# Patient Record
Sex: Female | Born: 1990 | Race: Black or African American | Hispanic: No | Marital: Single | Smoking: Current every day smoker
Health system: Southern US, Community
[De-identification: ages and names within clinical notes are randomized; demographics above are authoritative.]

## PROBLEM LIST (undated history)

## (undated) ENCOUNTER — Inpatient Hospital Stay: Payer: Self-pay

## (undated) DIAGNOSIS — R519 Headache, unspecified: Secondary | ICD-10-CM

## (undated) DIAGNOSIS — R51 Headache: Secondary | ICD-10-CM

## (undated) DIAGNOSIS — D649 Anemia, unspecified: Secondary | ICD-10-CM

## (undated) DIAGNOSIS — F32A Depression, unspecified: Secondary | ICD-10-CM

## (undated) DIAGNOSIS — A6000 Herpesviral infection of urogenital system, unspecified: Secondary | ICD-10-CM

## (undated) DIAGNOSIS — F329 Major depressive disorder, single episode, unspecified: Secondary | ICD-10-CM

## (undated) HISTORY — PX: BREAST SURGERY: SHX581

---

## 2013-06-17 HISTORY — PX: DIAGNOSTIC LAPAROSCOPY: SUR761

## 2013-06-17 HISTORY — PX: INDUCED ABORTION: SHX677

## 2013-06-20 ENCOUNTER — Emergency Department: Payer: Self-pay | Admitting: Emergency Medicine

## 2013-06-20 LAB — BASIC METABOLIC PANEL
ANION GAP: 3 — AB (ref 7–16)
BUN: 8 mg/dL (ref 7–18)
CO2: 27 mmol/L (ref 21–32)
CREATININE: 0.8 mg/dL (ref 0.60–1.30)
Calcium, Total: 9.5 mg/dL (ref 8.5–10.1)
Chloride: 107 mmol/L (ref 98–107)
EGFR (African American): >60
EGFR (Non-African Amer.): >60
Glucose: 98 mg/dL (ref 65–99)
OSMOLALITY: 272 (ref 275–301)
Potassium: 4.2 mmol/L (ref 3.5–5.1)
SODIUM: 137 mmol/L (ref 136–145)

## 2013-06-20 LAB — CBC WITH DIFFERENTIAL/PLATELET
BASOS ABS: 0 10*3/uL (ref 0.0–0.1)
BASOS PCT: 0.7 %
Eosinophil #: 0.5 10*3/uL (ref 0.0–0.7)
Eosinophil %: 9.1 %
HCT: 38.4 % (ref 35.0–47.0)
HGB: 12.6 g/dL (ref 12.0–16.0)
Lymphocyte #: 2 10*3/uL (ref 1.0–3.6)
Lymphocyte %: 34.8 %
MCH: 28.2 pg (ref 26.0–34.0)
MCHC: 32.8 g/dL (ref 32.0–36.0)
MCV: 86 fL (ref 80–100)
Monocyte #: 0.3 x10 3/mm (ref 0.2–0.9)
Monocyte %: 5.8 %
NEUTROS PCT: 49.6 %
Neutrophil #: 2.8 10*3/uL (ref 1.4–6.5)
PLATELETS: 280 10*3/uL (ref 150–440)
RBC: 4.46 10*6/uL (ref 3.80–5.20)
RDW: 13.8 % (ref 11.5–14.5)
WBC: 5.6 10*3/uL (ref 3.6–11.0)

## 2013-06-20 LAB — URINALYSIS, COMPLETE
Bacteria: NONE SEEN
Bilirubin,UR: NEGATIVE
Blood: NEGATIVE
Glucose,UR: NEGATIVE mg/dL (ref 0–75)
Ketone: NEGATIVE
LEUKOCYTE ESTERASE: NEGATIVE
NITRITE: NEGATIVE
PROTEIN: NEGATIVE
Ph: 6 (ref 4.5–8.0)
Specific Gravity: 1.025 (ref 1.003–1.030)
Squamous Epithelial: 2

## 2014-12-14 ENCOUNTER — Emergency Department
Admission: EM | Admit: 2014-12-14 | Discharge: 2014-12-14 | Disposition: A | Discharge status: Home / Self Care | Payer: 59 | Attending: Emergency Medicine | Admitting: Emergency Medicine

## 2014-12-14 ENCOUNTER — Emergency Department: Payer: 59

## 2014-12-14 ENCOUNTER — Encounter: Payer: Self-pay | Admitting: Emergency Medicine

## 2014-12-14 DIAGNOSIS — O211 Hyperemesis gravidarum with metabolic disturbance: Secondary | ICD-10-CM | POA: Insufficient documentation

## 2014-12-14 DIAGNOSIS — Z87891 Personal history of nicotine dependence: Secondary | ICD-10-CM | POA: Insufficient documentation

## 2014-12-14 DIAGNOSIS — R1110 Vomiting, unspecified: Secondary | ICD-10-CM

## 2014-12-14 DIAGNOSIS — Z3A01 Less than 8 weeks gestation of pregnancy: Secondary | ICD-10-CM | POA: Diagnosis not present

## 2014-12-14 DIAGNOSIS — Z3491 Encounter for supervision of normal pregnancy, unspecified, first trimester: Secondary | ICD-10-CM

## 2014-12-14 LAB — COMPREHENSIVE METABOLIC PANEL
ALBUMIN: 4.1 g/dL (ref 3.5–5.0)
ALT: 57 U/L — AB (ref 14–54)
AST: 41 U/L (ref 15–41)
Alkaline Phosphatase: 77 U/L (ref 38–126)
Anion gap: 9 (ref 5–15)
BUN: 10 mg/dL (ref 6–20)
CALCIUM: 9.5 mg/dL (ref 8.9–10.3)
CO2: 25 mmol/L (ref 22–32)
Chloride: 101 mmol/L (ref 101–111)
Creatinine, Ser: 0.69 mg/dL (ref 0.44–1.00)
GFR calc Af Amer: >60 mL/min (ref >60)
GFR calc non Af Amer: >60 mL/min (ref >60)
GLUCOSE: 95 mg/dL (ref 65–99)
Potassium: 3.7 mmol/L (ref 3.5–5.1)
Sodium: 135 mmol/L (ref 135–145)
TOTAL PROTEIN: 8.2 g/dL — AB (ref 6.5–8.1)
Total Bilirubin: 0.6 mg/dL (ref 0.3–1.2)

## 2014-12-14 LAB — URINALYSIS COMPLETE WITH MICROSCOPIC (ARMC ONLY)
Bilirubin Urine: NEGATIVE
Glucose, UA: NEGATIVE mg/dL
Hgb urine dipstick: NEGATIVE
Leukocytes, UA: NEGATIVE
Nitrite: NEGATIVE
Protein, ur: 30 mg/dL — AB
Specific Gravity, Urine: 1.025 (ref 1.005–1.030)
pH: 6 (ref 5.0–8.0)

## 2014-12-14 LAB — CBC WITH DIFFERENTIAL/PLATELET
Basophils Absolute: 0.1 10*3/uL (ref 0–0.1)
Basophils Relative: 1 %
Eosinophils Absolute: 0 10*3/uL (ref 0–0.7)
Eosinophils Relative: 1 %
HCT: 41 % (ref 35.0–47.0)
HEMOGLOBIN: 13.4 g/dL (ref 12.0–16.0)
LYMPHS PCT: 20 %
Lymphs Abs: 1.8 10*3/uL (ref 1.0–3.6)
MCH: 27.6 pg (ref 26.0–34.0)
MCHC: 32.6 g/dL (ref 32.0–36.0)
MCV: 84.6 fL (ref 80.0–100.0)
MONOS PCT: 7 %
Monocytes Absolute: 0.6 10*3/uL (ref 0.2–0.9)
NEUTROS ABS: 6.5 10*3/uL (ref 1.4–6.5)
NEUTROS PCT: 71 %
Platelets: 293 10*3/uL (ref 150–440)
RBC: 4.85 MIL/uL (ref 3.80–5.20)
RDW: 14.8 % — ABNORMAL HIGH (ref 11.5–14.5)
WBC: 9 10*3/uL (ref 3.6–11.0)

## 2014-12-14 LAB — HCG, QUANTITATIVE, PREGNANCY: hCG, Beta Chain, Quant, S: 223911 m[IU]/mL — ABNORMAL HIGH (ref <5)

## 2014-12-14 MED ORDER — ONDANSETRON HCL 4 MG/2ML IJ SOLN
INTRAMUSCULAR | Status: AC
  Administered 2014-12-14: 4 mg via INTRAVENOUS
  Filled 2014-12-14: qty 2

## 2014-12-14 MED ORDER — ONDANSETRON HCL 4 MG/2ML IJ SOLN
4.0000 mg | Freq: Once | INTRAMUSCULAR | Status: AC
  Administered 2014-12-14: 4 mg via INTRAVENOUS

## 2014-12-14 MED ORDER — DEXTROSE 5 % AND 0.9 % NACL IV BOLUS
1000.0000 mL | Freq: Once | INTRAVENOUS | Status: AC
  Administered 2014-12-14: 1000 mL via INTRAVENOUS

## 2014-12-14 MED ORDER — ONDANSETRON HCL 4 MG PO TABS: 4.0000 mg | ORAL_TABLET | Freq: Every day | ORAL | Status: DC | PRN

## 2014-12-14 NOTE — ED Provider Notes (Signed)
[facility] Emergency Department Provider Note  ____________________________________________  Time seen: 1555  I have reviewed the triage vital signs and the nursing notes.   HISTORY  Chief Complaint Nausea and Emesis     HPI Molly Graves is a 24 y.o. female who is approximately [redacted] weeks pregnant who is having nausea and vomiting. She has been seen at [facility]. She's been prescribed Reglan. She took half a tablet this morning and thinks she kept it down. Despite this she has had emesis today and has had little intake the past 2-3 days. She reports her urine is dark. She is having some discomfort in her pelvis (this is more notable on the right on exam).  No past medical history on file. Patient denies past medical history  There are no active problems to display for this patient.   Past Surgical History  Procedure Laterality Date  . Breast surgery      Current Outpatient Rx  Name  Route  Sig  Dispense  Refill  . ondansetron (ZOFRAN) 4 MG tablet   Oral   Take 1 tablet (4 mg total) by mouth daily as needed for nausea or vomiting.   12 tablet   1     Allergies Review of patient's allergies indicates no known allergies.  History reviewed. No pertinent family history.  Social History History  Substance Use Topics  . Smoking status: Former Games developer  . Smokeless tobacco: Not on file  . Alcohol Use: No    Review of Systems  Constitutional: Negative for fever. ENT: Negative for sore throat. Cardiovascular: Negative for chest pain. Respiratory: Negative for shortness of breath. Gastrointestinal: Positive for nausea and vomiting. See history of present illness. Genitourinary: Patient is pregnant. She has some lower abdominal pain. See history of present illness Musculoskeletal: Patient reports she is having some back pain. Skin: Negative for rash. Neurological: Negative for headaches   10-point ROS otherwise  negative.  ____________________________________________   PHYSICAL EXAM:  VITAL SIGNS: ED Triage Vitals  Enc Vitals Group     BP 12/14/14 1300 113/70 mmHg     Pulse Rate 12/14/14 1300 66     Resp 12/14/14 1300 18     Temp 12/14/14 1300 98.6 F (37 C)     Temp Source 12/14/14 1300 Oral     SpO2 12/14/14 1300 100 %     Weight 12/14/14 1300 140 lb (63.504 kg)     Height 12/14/14 1300  (1.626 m)     Head Cir --      Peak Flow --      Pain Score 12/14/14 1301 7     Pain Loc --      Pain Edu? --      Excl. in GC? --     Constitutional: Alert and oriented. Well appearing and in no distress. ENT   Head: Normocephalic and atraumatic.   Nose: No congestion/rhinnorhea.   Mouth/Throat: Mucous membranes are moist. Cardiovascular: Normal rate, regular rhythm, no murmur noted Respiratory:  Normal respiratory effort, no tachypnea.    Breath sounds are clear and equal bilaterally.  Gastrointestinal: Soft.No distention. Patient does have some tenderness in the right abdomen. This is worse in the right lower quadrant and pelvic area. Back: No muscle spasm, no tenderness, no CVA tenderness. Musculoskeletal: No deformity noted. Nontender with normal range of motion in all extremities.  No noted edema. Neurologic:  Normal speech and language. No gross focal neurologic deficits are appreciated.  Skin:  Skin  is warm, dry. No rash noted. Psychiatric: Mood and affect are normal. Speech and behavior are normal.  ____________________________________________    LABS (pertinent positives/negatives)  Urinalysis: Negative for infection but with 2+ ketones noted. HCG Quant: 223,911 (notably high for 7 weeks of gestation.) CBC: Normal Metabolic panel: Normal electrolytes and kidney function. Slight elevation in ALT at 57. ____________________________________________ ____________________________________________    RADIOLOGY  Pelvic ultrasound  IMPRESSION: 1. Single intrauterine  gestation with normal cardiac activity. 2. Estimated gestational age by crown-rump leak equals 7 weeks 4 days. 3. Large multi cystic lesion of the right ovary has benign imaging characteristics. Recommend non emergent Ob GYN consultation and follow-up ultrasound. ____________________________________________   INITIAL IMPRESSION / ASSESSMENT AND PLAN / ED COURSE  Pertinent labs & imaging results that were available during my care of the patient were reviewed by me and considered in my medical decision making (see chart for details).  Pleasant 24 year old female with hyperemesis gravidarum at 7 weeks. Her hCG level is notably elevated for her stage of gestation. We will obtain an ultrasound to evaluate for multiple gestation or molar pregnancy. I'm treating her with D5, normal saline, for rehydration and to help treat the 2+ ketones. The patient denied have discussed different antinausea medicines, including the risk and benefits of different medications, and I agree on using Zofran currently.  ----------------------------------------- 6:38 PM on 12/14/2014 -----------------------------------------  Ultrasound finds a single IUP with normal cardiac activity at approximate 7 weeks 4 days of gestational age. There is a large multicystic lesion on the right ovary that appears benign per radiology. Further evaluation recommended.  The patient has been treated with IV fluids and Zofran. At this time she is comfortable without nausea. She is not having any discomfort. She is nervous to eat at this time. I have encouraged her to eat small meals. I will prescribe Zofran. She can alternate between that and Reglan or use whichever she feels is best.  ____________________________________________   FINAL CLINICAL IMPRESSION(S) / ED DIAGNOSES  Final diagnoses:  Hyperemesis gravidarum with dehydration  Intrauterine normal pregnancy, first trimester      Darien Ramusavid W Elvie Maines, MD 12/14/14 1926

## 2014-12-14 NOTE — ED Notes (Signed)
Patient with no complaints at this time. Respirations even and unlabored. Skin warm/dry. Discharge instructions reviewed with patient at this time. Patient given opportunity to voice concerns/ask questions. IV removed per policy and band-aid applied to site. Patient discharged at this time and left Emergency Department with steady gait.  

## 2014-12-14 NOTE — Discharge Instructions (Signed)
As we spoke about, you have a high pregnancy hormone level. You have a single intrauterine pregnancy noted. You're feeling better after IV fluids and Zofran. He may take Zofran or Reglan for your nausea. Eat small meals frequently. Drink plenty of fluid. [facility] care. Return to the emergency department if you have any urgent concerns.  Morning Sickness Morning sickness is when you feel sick to your stomach (nauseous) during pregnancy. This nauseous feeling may or may not come with vomiting. It often occurs in the morning but can be a problem any time of day. Morning sickness is most common during the first trimester, but it may continue throughout pregnancy. While morning sickness is unpleasant, it is usually harmless unless you develop severe and continual vomiting (hyperemesis gravidarum). This condition requires more intense treatment.  CAUSES  The cause of morning sickness is not completely known but seems to be related to normal hormonal changes that occur in pregnancy. RISK FACTORS You are at greater risk if you:  Experienced nausea or vomiting before your pregnancy.  Had morning sickness during a previous pregnancy.  Are pregnant with more than one baby, such as twins. TREATMENT  Do not use any medicines (prescription, over-the-counter, or herbal) for morning sickness without first talking to your health care provider. Your health care provider may prescribe or recommend:  Vitamin B6 supplements.  Anti-nausea medicines.  The herbal medicine ginger. HOME CARE INSTRUCTIONS   Only take over-the-counter or prescription medicines as directed by your health care provider.  Taking multivitamins before getting pregnant can prevent or decrease the severity of morning sickness in most women.  Eat a piece of dry toast or unsalted crackers before getting out of bed in the morning.  Eat five or six small meals a day.  Eat dry and bland foods (rice, baked  potato). Foods high in carbohydrates are often helpful.  Do not drink liquids with your meals. Drink liquids between meals.  Avoid greasy, fatty, and spicy foods.  Get someone to cook for you if the smell of any food causes nausea and vomiting.  If you feel nauseous after taking prenatal vitamins, take the vitamins at night or with a snack.  Snack on protein foods (nuts, yogurt, cheese) between meals if you are hungry.  Eat unsweetened gelatins for desserts.  Wearing an acupressure wristband (worn for sea sickness) may be helpful.  Acupuncture may be helpful.  Do not smoke.  Get a humidifier to keep the air in your house free of odors.  Get plenty of fresh air. SEEK MEDICAL CARE IF:   Your home remedies are not working, and you need medicine.  You feel dizzy or lightheaded.  You are losing weight. SEEK IMMEDIATE MEDICAL CARE IF:   You have persistent and uncontrolled nausea and vomiting.  You pass out (faint). MAKE SURE YOU:  Understand these instructions.  Will watch your condition.  Will get help right away if you are not doing well or get worse. Document Released: 07/25/2006 Document Revised: 06/08/2013 Document Reviewed: 11/18/2012 [employer] Patient Information 2015 [employer] [place]. This information is not intended to replace advice given to you by your health care provider. Make sure you discuss any questions you have with your health care provider.

## 2014-12-14 NOTE — ED Notes (Signed)
Pt to ED via EMS transport with c/o n,v, lower back pain and constipation x 1 week, pt states she his [redacted] weeks pregnant, pt has been taking reglan that was prescribed by OB MD

## 2014-12-22 LAB — OB RESULTS CONSOLE RUBELLA ANTIBODY, IGM: Rubella: IMMUNE

## 2014-12-22 LAB — OB RESULTS CONSOLE VARICELLA ZOSTER ANTIBODY, IGG: Varicella: IMMUNE

## 2014-12-22 LAB — OB RESULTS CONSOLE GC/CHLAMYDIA
CHLAMYDIA, DNA PROBE: NEGATIVE
Gonorrhea: NEGATIVE

## 2014-12-22 LAB — OB RESULTS CONSOLE HEPATITIS B SURFACE ANTIGEN: HEP B S AG: NEGATIVE

## 2015-06-18 NOTE — L&D Delivery Note (Signed)
Deliver Note   Date of Delivery:   08/04/2015 Primary OB:   [facility] Gestational Age/EDD: [redacted]w[redacted]d by 08/01/2015, by Other Basis  Antepartum complications:  OB History    Gravida Para Term Preterm AB TAB SAB Ectopic Multiple Living   0 1 0 0 0 0 1      Delivered By:   Vena Austria MD  Delivery Type:   FAVD Anesthesia:    Epidural  Intrapartum complications:  GBS:    Positive (01/10 0000) Laceration:    First degree Episiotomy:    none Placenta:    Spontaneous Estimated Blood Loss:  Baby:    Liveborn female   APGAR (1 MIN): 8   APGAR (5 MINS):  9 Weight: pending  Deliver Details   At 20:41 a liveborn female was delivered via forceps assisted vaginal delivery (Presentation: ROA almost straight OA ).  Patient was displaying recurrent late deceleration but with good variability, good effort with pushing, tested pelvis to 7lbs 3oz, and more than adequate on pelvimetry with non-prominent ischeal spines, and wide pubic symphysis. EFW 8lbs.  Supplemental O2 applied, given fetal tracing discussed expediting delivery with mother via application of long [employer]  The blades were applied, patient began contracting after application of left blade, which was reapplied.  Position verified, articulated with ease during next contraction.  Downward traction was applied guiding the fetal head underneath the the pelvic bone, then gently upward traction was applied until delivery of the fetal brow, providing perineal support with the operators left hand.  The blades were disarticulated and removed.  The remainder of the head was delivered with maternal pushing and restituted facing left.  There was a mild less than 30 second shoulder dystocia which was relieved by reclining the bed and McRoberts without the need for shoulder pressure or additional maneuvers.  The remainder of the body delivered with ease. APGAR:8 , ; weight pending.   Placenta status: spontaneous, intact.  Cord: 3 vessel cord   without complications.    Mom to postpartum.  Baby to Couplet care / Skin to Skin.

## 2015-06-27 LAB — OB RESULTS CONSOLE GBS: GBS: POSITIVE

## 2015-07-12 ENCOUNTER — Encounter: Payer: Self-pay | Admitting: *Deleted

## 2015-07-12 ENCOUNTER — Inpatient Hospital Stay
Admission: EM | Admit: 2015-07-12 | Discharge: 2015-07-12 | Disposition: A | Discharge status: Home / Self Care | Payer: 59 | Attending: Obstetrics & Gynecology | Admitting: Obstetrics & Gynecology

## 2015-07-12 DIAGNOSIS — Z3490 Encounter for supervision of normal pregnancy, unspecified, unspecified trimester: Secondary | ICD-10-CM

## 2015-07-12 DIAGNOSIS — O368130 Decreased fetal movements, third trimester, not applicable or unspecified: Secondary | ICD-10-CM | POA: Diagnosis present

## 2015-07-12 HISTORY — DX: Herpesviral infection of urogenital system, unspecified: A60.00

## 2015-07-12 NOTE — OB Triage Note (Signed)
Vaginal spotting started 10:00 this morning, pinkish discharge now (red earlier today). Decreased fetal movement this morning; but recently felt baby move, "a little bit".

## 2015-07-12 NOTE — Discharge Instructions (Signed)
Discharge instructions reviewed with patient. FOB also at the bedside. Pt. Verbalized understanding of all discharge instructions, copies signed, one copy given to patient.  RN discussed procedure to follow if : pt. Has a temp of 100.4 or greater, rupture of membrane - come to hospital right away (pt. Is GBS positive), vaginal bleeding (saturating a peripad - call EMS - possible medical emergency), and decrease fetal movement ....pt. Verbalized understanding. Instructed pt. to call office to cancel today's 5 p.m. Appt. And reschedule for next week. Pt. In agreement.

## 2015-07-12 NOTE — Final Progress Note (Signed)
Physician Final Progress Note  Patient ID: Molly Graves MRN: [id number] DOB/AGE: 1990-09-19 24 y.o.  Admit date: 07/12/2015 Admitting provider: Leola Brazil, MD Discharge date: 07/12/2015   Admission Diagnoses: IUP at 37.1 weeks Decreased fetal movement Spotting   Discharge Diagnoses: Reactive NST Vaginal spotting possibly due to monilial infection Consults: none  Significant Findings/ Diagnostic Studies: 25 year old G3 P1011 with EDC= 08/01/2015 presents at 37.1 weeks with complaints of decreased fetal movement since this AM and spotting.  Has also been having irregular contractions. No recent intercourse. Has been taking Amoxicillin for a tooth infection and was diagnosed with a monilial infection last week. Took Monistat for a few days but stopped because the cream "burned". Has  A history of genital herpes, but has not been started on Valtrex. Exam: FHR 130 with accelerations to 150s to 160s with moderate variability.  Toco: occasional contraction Spec exam: Vulva: Labia majora appears irritated, no herpetic lesions seen Vagina: no blood in vault, discharge white Wet prep: negative for hyphae, clue cells, Trich Cervix: FT/50%/-1   Procedures: Non stress test which was reactive  Discharge Condition:stable  Disposition: 01-Home or Self Care  Diet: Regular diet  Discharge Activity: Activity as tolerated  Discharge prescriptions: Diflucan 150 mgm po q3days x 2 doses MycologII oint ext 2-3 x/day Valtrex 500 mgm daily   Total time spent taking care of this patient: 20 minutes  Signed: Farrel Conners 07/12/2015, 3:23 PM

## 2015-08-03 ENCOUNTER — Encounter: Payer: Self-pay | Admitting: *Deleted

## 2015-08-03 ENCOUNTER — Inpatient Hospital Stay
Admission: EM | Admit: 2015-08-03 | Discharge: 2015-08-06 | DRG: 775 | Disposition: A | Discharge status: Home / Self Care | Payer: 59 | Attending: Obstetrics and Gynecology | Admitting: Obstetrics and Gynecology

## 2015-08-03 DIAGNOSIS — Z87891 Personal history of nicotine dependence: Secondary | ICD-10-CM

## 2015-08-03 DIAGNOSIS — Z3A40 40 weeks gestation of pregnancy: Secondary | ICD-10-CM | POA: Diagnosis not present

## 2015-08-03 DIAGNOSIS — Z79899 Other long term (current) drug therapy: Secondary | ICD-10-CM | POA: Diagnosis not present

## 2015-08-03 DIAGNOSIS — O368190 Decreased fetal movements, unspecified trimester, not applicable or unspecified: Secondary | ICD-10-CM | POA: Diagnosis present

## 2015-08-03 HISTORY — DX: Depression, unspecified: F32.A

## 2015-08-03 HISTORY — DX: Major depressive disorder, single episode, unspecified: F32.9

## 2015-08-03 LAB — TYPE AND SCREEN
ABO/RH(D): O POS
Antibody Screen: NEGATIVE

## 2015-08-03 LAB — CBC
HCT: 31.8 % — ABNORMAL LOW (ref 35.0–47.0)
Hemoglobin: 10 g/dL — ABNORMAL LOW (ref 12.0–16.0)
MCH: 22.4 pg — AB (ref 26.0–34.0)
MCHC: 31.4 g/dL — AB (ref 32.0–36.0)
MCV: 71.4 fL — AB (ref 80.0–100.0)
PLATELETS: 272 10*3/uL (ref 150–440)
RBC: 4.45 MIL/uL (ref 3.80–5.20)
RDW: 20.5 % — AB (ref 11.5–14.5)
WBC: 9.5 10*3/uL (ref 3.6–11.0)

## 2015-08-03 LAB — CHLAMYDIA/NGC RT PCR (ARMC ONLY)
CHLAMYDIA TR: NOT DETECTED
N GONORRHOEAE: NOT DETECTED

## 2015-08-03 LAB — ABO/RH: ABO/RH(D): O POS

## 2015-08-03 MED ORDER — SODIUM CHLORIDE 0.9 % IV SOLN
1.0000 g | INTRAVENOUS | Status: DC
  Administered 2015-08-03 – 2015-08-04 (×4): 1 g via INTRAVENOUS
  Filled 2015-08-03 (×8): qty 1000

## 2015-08-03 MED ORDER — SODIUM CHLORIDE 0.9 % IV SOLN
2.0000 g | Freq: Once | INTRAVENOUS | Status: AC
  Administered 2015-08-03: 2 g via INTRAVENOUS
  Filled 2015-08-03: qty 2000

## 2015-08-03 MED ORDER — MISOPROSTOL 25 MCG QUARTER TABLET: 25.0000 ug | ORAL_TABLET | ORAL | Status: DC

## 2015-08-03 MED ORDER — OXYTOCIN 40 UNITS IN LACTATED RINGERS INFUSION - SIMPLE MED
2.5000 [IU]/h | INTRAVENOUS | Status: DC
  Administered 2015-08-04: 39.96 [IU]/h via INTRAVENOUS
  Filled 2015-08-03: qty 1000

## 2015-08-03 MED ORDER — OXYTOCIN 10 UNIT/ML IJ SOLN
INTRAMUSCULAR | Status: AC
  Filled 2015-08-03: qty 2

## 2015-08-03 MED ORDER — ONDANSETRON HCL 4 MG/2ML IJ SOLN: 4.0000 mg | Freq: Four times a day (QID) | INTRAMUSCULAR | Status: DC | PRN

## 2015-08-03 MED ORDER — FAMOTIDINE 20 MG PO TABS
10.0000 mg | ORAL_TABLET | Freq: Two times a day (BID) | ORAL | Status: DC
  Administered 2015-08-03 – 2015-08-06 (×5): 10 mg via ORAL
  Filled 2015-08-03 (×5): qty 1

## 2015-08-03 MED ORDER — MISOPROSTOL 25 MCG QUARTER TABLET
ORAL_TABLET | ORAL | Status: AC
  Administered 2015-08-03: 25 ug via ORAL
  Filled 2015-08-03: qty 0.25

## 2015-08-03 MED ORDER — MISOPROSTOL 200 MCG PO TABS
ORAL_TABLET | ORAL | Status: AC
  Filled 2015-08-03: qty 4

## 2015-08-03 MED ORDER — TERBUTALINE SULFATE 1 MG/ML IJ SOLN
0.2500 mg | Freq: Once | INTRAMUSCULAR | Status: DC | PRN
  Filled 2015-08-03: qty 1

## 2015-08-03 MED ORDER — AMMONIA AROMATIC IN INHA
RESPIRATORY_TRACT | Status: AC
  Filled 2015-08-03: qty 10

## 2015-08-03 MED ORDER — LIDOCAINE HCL (PF) 1 % IJ SOLN
INTRAMUSCULAR | Status: AC
  Filled 2015-08-03: qty 30

## 2015-08-03 MED ORDER — ACETAMINOPHEN 325 MG PO TABS: 650.0000 mg | ORAL_TABLET | ORAL | Status: DC | PRN

## 2015-08-03 MED ORDER — LACTATED RINGERS IV SOLN
INTRAVENOUS | Status: DC
  Administered 2015-08-03: 11:00:00 via INTRAVENOUS
  Administered 2015-08-03: 125 mL/h via INTRAVENOUS
  Administered 2015-08-04: 01:00:00 via INTRAVENOUS

## 2015-08-03 MED ORDER — OXYTOCIN 40 UNITS IN LACTATED RINGERS INFUSION - SIMPLE MED
1.0000 m[IU]/min | INTRAVENOUS | Status: DC
  Administered 2015-08-03: 2 m[IU]/min via INTRAVENOUS

## 2015-08-03 MED ORDER — MISOPROSTOL 25 MCG QUARTER TABLET
25.0000 ug | ORAL_TABLET | ORAL | Status: DC
  Administered 2015-08-03 (×3): 25 ug via ORAL
  Filled 2015-08-03: qty 1
  Filled 2015-08-03: qty 0.25
  Filled 2015-08-03 (×4): qty 1
  Filled 2015-08-03: qty 0.25

## 2015-08-03 MED ORDER — OXYTOCIN BOLUS FROM INFUSION: 500.0000 mL | INTRAVENOUS | Status: DC

## 2015-08-03 MED ORDER — LACTATED RINGERS IV SOLN
500.0000 mL | INTRAVENOUS | Status: DC | PRN
  Administered 2015-08-03 – 2015-08-04 (×2): 500 mL via INTRAVENOUS

## 2015-08-03 MED ORDER — BUTORPHANOL TARTRATE 1 MG/ML IJ SOLN
2.0000 mg | INTRAMUSCULAR | Status: DC | PRN
  Administered 2015-08-04: 2 mg via INTRAVENOUS
  Filled 2015-08-03 (×2): qty 2

## 2015-08-03 NOTE — Progress Notes (Signed)
Prolonged FHR deceleration to 90s at 12:44 x 3 min Pitocin discontinued.  Reviewed tracing and pt's status with Dr Elesa Massed. Recommends Cytotec administration. Orders changed and POM reviewed with pt.

## 2015-08-03 NOTE — OB Triage Note (Signed)
Pt. Here with c/o of contractions since 08/02/2015 at 0300.  Contractions getting worse. "I think I lost my mucus plug around 2200 last night."  "I also think my water may have broken, slow leak." (Clear fluid)  Pt. Denies vaginal bleeding, and is feeling the baby move.

## 2015-08-03 NOTE — Progress Notes (Signed)
Intrapartum progress note  S:  Doing well no complaints.  Comfortable in bed with family at bedside.  No LOF VB CTX, +FM  O: BP 125/78 mmHg  Pulse 94  Temp(Src) 98.5 F (36.9 C) (Oral)  Resp 18  Ht  (1.626 m)  Wt 95.255 kg (210 lb)  BMI 36.03 kg/m2  SpO2 100%  LMP 10/25/2014 (Exact Date)  FHT: 145 mod + accels  + deep variables at 7pm and 10:20 pm, spontaneously resolved. TOCO: irritable, occasional ctx SVE: 3.5/70/-3. Posterior, soft  A/P: 24yo [id number]@ 40.2 with IOL due to past due date and category 2 strip  1. IUP: category 2 strip with accelerations and moderate variablilty ensures acidemia not present. 2. IOL: s/p pitocin for a short time then cytotec x 2.  Bishop = 6 at this time, continue cytotec. 3. HSV: not documented in H&P but speculum exam performed upon admission and no lesions noted.   4. GBS+ - receiving ampicillin  S/p 3 doses 5. Continue active management.  ----- Ranae Plumber, MD Attending Obstetrician and Gynecologist [facility]

## 2015-08-03 NOTE — Progress Notes (Signed)
Small amount of clear fluid seen at the vagina, but Nitrazine negative.

## 2015-08-03 NOTE — H&P (Signed)
Obstetric History and Physical  Molly Graves is a 25 y.o. G3P1011 with Estimated Date of Delivery: 08/01/15 per LMP and 9 wk [place] who presents at [redacted]w[redacted]d  presenting for contractions q 20-30 minutes since yesterday and leaking of fluid. No vaginal bleeding, with active fetal movement.    Prenatal Course Source of Care: [facility]  with onset of care at 8 weeks Pregnancy complications or risks: -H/o HSV - on Valtrex, no recent outbreaks -Depression -hx of mild PPD and sx at end of pregnancy, given Rx for Zoloft, but did not start yet -Rt ovarian cyst 7-8 cm, in August was 2 cm  She plans to breastfeed She desires Nuvaring after 6 months or weaning, undecided prior to this for postpartum contraception.   Prenatal labs and studies: ABO, Rh: O+  Antibody: Neg Sickle Cell: neg Rubella: Immune Varicella: Immune RPR:  NR HBsAg:  Neg HIV: Neg GC/CT: Neg/Neg GBS: postive 1 hr Glucola: 131   Genetic screening: Declined  TDAP: UTD    Prenatal Transfer Tool   Past Medical History  Diagnosis Date  . Herpes genitalia   . Medical history non-contributory   . Depression     post partum depression    Past Surgical History  Procedure Laterality Date  . Breast surgery      lumpectomy left breast  . Diagnostic laparoscopy  2015    to rule out uterine injury after D&E  . Induced abortion  2015    OB History  Gravida Para Term Preterm AB SAB TAB Ectopic Multiple Living  0 1 0 0 0 0 1    # Outcome Date GA Lbr Len/2nd Weight Sex Delivery Anes PTL Lv  3 Current           2 Term 10/14/09   7 lb 3 oz (3.26 kg) F   N Y  1 AB               Social History   Social History  . Marital Status: Single    Spouse Name: N/A  . Number of Children: N/A  . Years of Education: N/A   Social History Main Topics  . Smoking status: Former Games developer  . Smokeless tobacco: None  . Alcohol Use: No  . Drug Use: No  . Sexual Activity: Yes   Other Topics Concern  . None   Social History Narrative     History reviewed. No pertinent family history.  Prescriptions prior to admission  Medication Sig Dispense Refill Last Dose  . Prenatal Vit-Fe Fumarate-FA (MULTIVITAMIN-PRENATAL) 27-0.8 MG TABS tablet Take 1 tablet by mouth daily at 12 noon.     . ranitidine (ZANTAC) 150 MG capsule Take 150 mg by mouth 2 (two) times daily.   08/03/2015 at Unknown time  . valACYclovir (VALTREX) 1000 MG tablet Take by mouth daily.     . ondansetron (ZOFRAN) 4 MG tablet Take 1 tablet (4 mg total) by mouth daily as needed for nausea or vomiting. (Patient not taking: Reported on 07/12/2015) 12 tablet 1 Not Taking at Unknown time    No Known Allergies  Review of Systems: Negative except for what is mentioned in HPI.  Physical Exam: BP 113/63 mmHg  Pulse 93  Temp(Src) 98.5 F (36.9 C) (Oral)  Resp 18  Ht  (1.626 m)  Wt 210 lb (95.255 kg)  BMI 36.03 kg/m2  LMP 10/25/2014 (Exact Date) GENERAL: Well-developed, well-nourished female in no acute distress.  ABDOMEN: Soft, nontender, nondistended, gravid. EXTREMITIES:  Nontender, no edema Cervical Exam: Dilatation 2.5-3cm   Effacement 50%   Station -3   Presentation: cephalic FHT: Baseline 135-145, mod variability, + accelertions. Decelerations x 2: at 0838 to 70 bpm x 1 minute and at 9:04 to 80 bpm x 1 minute. Both with return to baseline with repositioning only. Toco did not show contraction at time of deceleration Contractions: Every 6-7 mins   Pertinent Labs/Studies:   No results found for this or any previous visit (from the past 24 hour(s)).  Assessment : IUP at [redacted]w[redacted]d, Category 2 tracing (decelerations)  Plan: Cat 2 tracing per FHR decelerations (otherwise category 1 tracing) - Reviewed risk/benefit of IOL with pt, agrees with plan to keep for IOL d/t FHR decelerations. Begin Pitocin induction.   GBS + begin Ampicillin PPx  IV stadol or epidural as desired by patient/pending labs  H/o depression - Pt denies current symptoms, but will  monitor for PPD. Has Rx to start if desired, reviewed weaning on and off medication and side effects. Will follow closely PP.

## 2015-08-04 ENCOUNTER — Inpatient Hospital Stay: Payer: 59 | Admitting: Anesthesiology

## 2015-08-04 ENCOUNTER — Encounter: Payer: Self-pay | Admitting: Anesthesiology

## 2015-08-04 LAB — RPR: RPR Ser Ql: NONREACTIVE

## 2015-08-04 MED ORDER — OXYCODONE-ACETAMINOPHEN 5-325 MG PO TABS
1.0000 | ORAL_TABLET | ORAL | Status: DC | PRN
  Administered 2015-08-05 (×2): 1 via ORAL
  Filled 2015-08-04 (×2): qty 1

## 2015-08-04 MED ORDER — WITCH HAZEL-GLYCERIN EX PADS: 1.0000 "application " | MEDICATED_PAD | CUTANEOUS | Status: DC | PRN

## 2015-08-04 MED ORDER — EPHEDRINE 5 MG/ML INJ
10.0000 mg | INTRAVENOUS | Status: DC | PRN
  Filled 2015-08-04: qty 2

## 2015-08-04 MED ORDER — FENTANYL 2.5 MCG/ML W/ROPIVACAINE 0.2% IN NS 100 ML EPIDURAL INFUSION (ARMC-ANES)
10.0000 mL/h | EPIDURAL | Status: DC
Start: 2015-08-04 — End: 2015-08-04

## 2015-08-04 MED ORDER — FERROUS SULFATE 325 (65 FE) MG PO TABS
325.0000 mg | ORAL_TABLET | Freq: Every day | ORAL | Status: DC
  Administered 2015-08-05 – 2015-08-06 (×2): 325 mg via ORAL
  Filled 2015-08-04 (×2): qty 1

## 2015-08-04 MED ORDER — BENZOCAINE-MENTHOL 20-0.5 % EX AERO: 1.0000 "application " | INHALATION_SPRAY | CUTANEOUS | Status: DC | PRN

## 2015-08-04 MED ORDER — BUPIVACAINE HCL (PF) 0.25 % IJ SOLN
INTRAMUSCULAR | Status: DC | PRN
  Administered 2015-08-04: 5 mL via EPIDURAL

## 2015-08-04 MED ORDER — PHENYLEPHRINE 40 MCG/ML (10ML) SYRINGE FOR IV PUSH (FOR BLOOD PRESSURE SUPPORT)
80.0000 ug | PREFILLED_SYRINGE | INTRAVENOUS | Status: DC | PRN
  Filled 2015-08-04: qty 2

## 2015-08-04 MED ORDER — LANOLIN HYDROUS EX OINT: TOPICAL_OINTMENT | CUTANEOUS | Status: DC | PRN

## 2015-08-04 MED ORDER — PRENATAL MULTIVITAMIN CH
1.0000 | ORAL_TABLET | Freq: Every day | ORAL | Status: DC
  Administered 2015-08-04 – 2015-08-06 (×3): 1 via ORAL
  Filled 2015-08-04 (×3): qty 1

## 2015-08-04 MED ORDER — ONDANSETRON HCL 4 MG PO TABS
4.0000 mg | ORAL_TABLET | ORAL | Status: DC | PRN
Start: 2015-08-04 — End: 2015-08-06

## 2015-08-04 MED ORDER — FENTANYL 2.5 MCG/ML W/ROPIVACAINE 0.2% IN NS 100 ML EPIDURAL INFUSION (ARMC-ANES)
EPIDURAL | Status: AC
  Administered 2015-08-04: 10 mL/h via EPIDURAL
  Filled 2015-08-04: qty 100

## 2015-08-04 MED ORDER — DOCUSATE SODIUM 100 MG PO CAPS
100.0000 mg | ORAL_CAPSULE | Freq: Every day | ORAL | Status: DC
  Administered 2015-08-04 – 2015-08-06 (×3): 100 mg via ORAL
  Filled 2015-08-04 (×3): qty 1

## 2015-08-04 MED ORDER — ONDANSETRON HCL 4 MG/2ML IJ SOLN: 4.0000 mg | INTRAMUSCULAR | Status: DC | PRN

## 2015-08-04 MED ORDER — DIPHENHYDRAMINE HCL 50 MG/ML IJ SOLN: 12.5000 mg | INTRAMUSCULAR | Status: DC | PRN

## 2015-08-04 MED ORDER — DIBUCAINE 1 % RE OINT: 1.0000 "application " | TOPICAL_OINTMENT | RECTAL | Status: DC | PRN

## 2015-08-04 MED ORDER — IBUPROFEN 600 MG PO TABS
600.0000 mg | ORAL_TABLET | Freq: Four times a day (QID) | ORAL | Status: DC
  Administered 2015-08-04 – 2015-08-06 (×8): 600 mg via ORAL
  Filled 2015-08-04 (×8): qty 1

## 2015-08-04 MED ORDER — OXYCODONE-ACETAMINOPHEN 5-325 MG PO TABS: 2.0000 | ORAL_TABLET | ORAL | Status: DC | PRN

## 2015-08-04 MED ORDER — SIMETHICONE 80 MG PO CHEW: 80.0000 mg | CHEWABLE_TABLET | ORAL | Status: DC | PRN

## 2015-08-04 MED ORDER — LACTATED RINGERS IV SOLN: 500.0000 mL | Freq: Once | INTRAVENOUS | Status: DC

## 2015-08-04 NOTE — Anesthesia Procedure Notes (Signed)
Epidural Patient location during procedure: OB  Staffing Anesthesiologist: Berdine Addison Performed by: anesthesiologist   Preanesthetic Checklist Completed: patient identified, site marked, surgical consent, pre-op evaluation, timeout performed, IV checked, risks and benefits discussed and monitors and equipment checked  Epidural Patient position: sitting Prep: Betadine Patient monitoring: heart rate, continuous pulse ox and blood pressure Approach: midline Location: L4-L5 Injection technique: LOR saline  Needle:  Needle type: Tuohy  Needle gauge: 18 G Needle length: 9 cm and 9 Catheter type: closed end flexible Catheter size: 20 Guage Test dose: negative and 1.5% lidocaine with Epi 1:200 K  Assessment Sensory level: T10 Events: blood not aspirated, injection not painful, no injection resistance, negative IV test and no paresthesia  Additional Notes   Patient tolerated the insertion well without complications. 0510 In. 1610 catheter. 9604 test. 0528 Bolus. 0530 Infusion.Reason for block:procedure for pain

## 2015-08-04 NOTE — Anesthesia Preprocedure Evaluation (Signed)
Anesthesia Evaluation  Patient identified by MRN, date of birth, ID band Patient awake    Reviewed: Allergy & Precautions, NPO status , Patient's Chart, lab work & pertinent test results, reviewed documented beta blocker date and time   Airway Mallampati: II  TM Distance: >3 FB     Dental  (+) Chipped   Pulmonary former smoker,          Cardiovascular     Neuro/Psych PSYCHIATRIC DISORDERS Depression    GI/Hepatic   Endo/Other    Renal/GU      Musculoskeletal   Abdominal   Peds  Hematology   Anesthesia Other Findings   Reproductive/Obstetrics                             Anesthesia Physical Anesthesia Plan  ASA: II  Anesthesia Plan: Epidural   Post-op Pain Management:    Induction:   Airway Management Planned:   Additional Equipment:   Intra-op Plan:   Post-operative Plan:   Informed Consent: I have reviewed the patients History and Physical, chart, labs and discussed the procedure including the risks, benefits and alternatives for the proposed anesthesia with the patient or authorized representative who has indicated his/her understanding and acceptance.     Plan Discussed with:   Anesthesia Plan Comments:         Anesthesia Quick Evaluation  

## 2015-08-04 NOTE — Progress Notes (Signed)
Intrapartum progress note  S: patient comfortable after epidural, resting.  Feeling rectal pressure.  No LOF VB, +FM CTX  O: BP 119/65 mmHg  Pulse 99  Temp(Src) 97.9 F (36.6 C) (Oral)  Resp 18  SpO2 100%  FHT: 130 mod +accels +early decels, +occasional variable, lates - none recurrent TOCO: q2-3 min SVE: 10cm, with bulging bag, AROM'd for clear fluid  A/P: 24yo G3P1011 @ 40.3 with IOL due to past due date with category 2 antepartum tracing.  1. IUP: category 2 tracing, but with accels and moderate variability no fetal acidosis is suspected.  Continuous EFM/TOCO 2. IOL: successful, s/p pitocin then cytotec x3 then pitocin and finally AROM.  Laboring down now, will push when ready. 3. Continue active management.  ----- Ranae Plumber, MD Attending Obstetrician and Gynecologist [facility]

## 2015-08-05 LAB — CBC
HCT: 25.3 % — ABNORMAL LOW (ref 35.0–47.0)
Hemoglobin: 7.7 g/dL — ABNORMAL LOW (ref 12.0–16.0)
MCH: 22.4 pg — AB (ref 26.0–34.0)
MCHC: 30.6 g/dL — AB (ref 32.0–36.0)
MCV: 73.1 fL — ABNORMAL LOW (ref 80.0–100.0)
PLATELETS: 220 10*3/uL (ref 150–440)
RBC: 3.46 MIL/uL — ABNORMAL LOW (ref 3.80–5.20)
RDW: 21.2 % — AB (ref 11.5–14.5)
WBC: 11.3 10*3/uL — ABNORMAL HIGH (ref 3.6–11.0)

## 2015-08-05 NOTE — Progress Notes (Signed)
Admit Date: 08/03/2015 Today's Date: 08/05/2015  Post Partum Day 1  Subjective:  no complaints, up ad lib, voiding and tolerating PO  Objective: Temp:  [97.6 F (36.4 C)-99 F (37.2 C)] 97.6 F (36.4 C) (02/18 0833) Pulse Rate:  [81-103] 81 (02/18 0833) Resp:  [18-20] 20 (02/18 0835) BP: (95-131)/(48-67) 112/56 mmHg (02/18 0833) SpO2:  [99 %-100 %] 100 % (02/18 1610)  Physical Exam:  General: alert, cooperative and no distress Lochia: appropriate Uterine Fundus: firm Incision: none DVT Evaluation: No evidence of DVT seen on physical exam.   Recent Labs  08/03/15 1107  HGB 10.0*  HCT 31.8*    Assessment/Plan: Plan for discharge tomorrow, Breastfeeding and Infant doing well  Plans minipill, then nuvaring   LOS: 2 days   Letitia Libra [facility] 08/05/2015, 9:20 AM

## 2015-08-06 MED ORDER — FERRALET 90 90-1 MG PO TABS: 1.0000 | ORAL_TABLET | Freq: Every morning | ORAL | Status: DC

## 2015-08-06 MED ORDER — NORETHINDRONE 0.35 MG PO TABS: 1.0000 | ORAL_TABLET | Freq: Every day | ORAL | Status: DC

## 2015-08-06 NOTE — Discharge Summary (Signed)
Obstetrical Discharge Summary  Date of Admission: 08/03/2015 Date of Discharge: @  Discharge Diagnosis: Term Pregnancy-delivered Primary OB:  [facility]   Gestational Age at Delivery: [redacted]w[redacted]d  Antepartum complications: none Date of Delivery: 08/04/15   Delivered By: [facility] Delivery Type: spontaneous vaginal delivery Intrapartum complications/course: None Anesthesia: none Placenta: spontaneous Laceration: n/a Episiotomy: none Live born female  Birth Weight: 8 lb 9.6 oz (3900 g) APGAR: 8, 9   Post partum course: Since the delivery, patient has tolerate activity, diet, and daily functions without difficulty or complication.  Min lochia.  No breast concerns at this time.  No signs of depression currently.   Postpartum Exam:General appearance: alert, cooperative and no distress GI: soft, non-tender; bowel sounds normal; no masses,  no organomegaly, Fundus Firm Extremities: extremities normal, atraumatic, no cyanosis or edema Breast: normal in size and symmetry  Disposition: home with infant Rh Immune globulin given: no Rubella vaccine given: no Varicella vaccine given: no Tdap vaccine given in AP or PP setting: given during prenatal care Flu vaccine given in AP or PP setting: given during prenatal care Contraception: oral progesterone-only contraceptive  Prenatal Labs: O POS//Rubella Immune//RPR negative//HIV negative/HepB Surface Ag negative//plans to breastfeed  Plan:  Molly Graves was discharged to home in good condition. Follow-up appointment with [facility] provider in 6 weeks  Discharge Medications:   Medication List    STOP taking these medications        ondansetron 4 MG tablet  Commonly known as:  ZOFRAN     ranitidine 150 MG capsule  Commonly known as:  ZANTAC      TAKE these medications        FERRALET 90 90-1 MG Tabs  Take 1 tablet by mouth every morning.     multivitamin-prenatal 27-0.8 MG Tabs tablet  Take 1 tablet by mouth daily at 12 noon.     norethindrone 0.35 MG tablet  Commonly known as:  ORTHO MICRONOR  Take 1 tablet (0.35 mg total) by mouth daily.  Start taking on:  08/20/2015   (Wait 2-3 weeks before starting)     valACYclovir 1000 MG tablet  Commonly known as:  VALTREX  Take by mouth daily.       ALSO---   COLACE for stool softener                     Call if need for ZOLOFT arises based on symptoms of depression    Follow-up arrangements:  6 weeks  Annamarie Major, MD

## 2015-08-06 NOTE — Progress Notes (Signed)
Discharge instructions provided. Pt and sig other verbalize understanding of all instructions and follow-up care.  Prescriptions given.  Pt discharged to home with infant at 1430 on 08/06/15 via wheelchair by nursing student. Reynold Bowen, RN 08/06/2015 2:40 PM

## 2015-08-06 NOTE — Anesthesia Postprocedure Evaluation (Signed)
Anesthesia Post Note  Patient: Molly Graves  Procedure(s) Performed: * No procedures listed *  Patient location during evaluation: Mother Baby Anesthesia Type: Epidural Level of consciousness: awake, awake and alert and oriented Pain management: pain level controlled Vital Signs Assessment: post-procedure vital signs reviewed and stable Respiratory status: spontaneous breathing and nonlabored ventilation Cardiovascular status: blood pressure returned to baseline and stable Postop Assessment: no headache, no backache and epidural receding Anesthetic complications: no    Last Vitals:  Filed Vitals:   08/06/15 0738 08/06/15 1143  BP:  121/63  Pulse: 77 87  Temp:  36.3 C  Resp:  18    Last Pain:  Filed Vitals:   08/06/15 1317  PainSc: 0-No pain                 Basilio Cairo

## 2015-08-06 NOTE — Progress Notes (Signed)
Education provided on need for Influenza vaccine.  Pt declines vaccine at this time.  Pt states that she received TDaP vaccine during pregnancy. Reynold Bowen, RN 08/06/2015 11:48 AM

## 2015-08-06 NOTE — Discharge Instructions (Addendum)
Vaginal Delivery, Care After °Refer to this sheet in the next few weeks. These discharge instructions provide you with information on caring for yourself after delivery. Your health care provider may also give you specific instructions. Your treatment has been planned according to the most current medical practices available, but problems sometimes occur. Call your health care provider if you have any problems or questions after you go home. °HOME CARE INSTRUCTIONS °· Take over-the-counter or prescription medicines only as directed by your health care provider or pharmacist. °· Do not drink alcohol, especially if you are breastfeeding or taking medicine to relieve pain. °· Do not chew or smoke tobacco. °· Do not use illegal drugs. °· Continue to use good perineal care. Good perineal care includes: °¨ Wiping your perineum from front to back. °¨ Keeping your perineum clean. °· Do not use tampons or douche until your health care provider says it is okay. °· Shower, wash your hair, and take tub baths as directed by your health care provider. °· Wear a well-fitting bra that provides breast support. °· Eat healthy foods. °· Drink enough fluids to keep your urine clear or pale yellow. °· Eat high-fiber foods such as whole grain cereals and breads, brown rice, beans, and fresh fruits and vegetables every day. These foods may help prevent or relieve constipation. °· Follow your health care provider's recommendations regarding resumption of activities such as climbing stairs, driving, lifting, exercising, or traveling. °· Talk to your health care provider about resuming sexual activities. Resumption of sexual activities is dependent upon your risk of infection, your rate of healing, and your comfort and desire to resume sexual activity. °· Try to have someone help you with your household activities and your newborn for at least a few days after you leave the hospital. °· Rest as much as possible. Try to rest or take a nap  when your newborn is sleeping. °· Increase your activities gradually. °· Keep all of your scheduled postpartum appointments. It is very important to keep your scheduled follow-up appointments. At these appointments, your health care provider will be checking to make sure that you are healing physically and emotionally. °SEEK MEDICAL CARE IF:  °· You are passing large clots from your vagina. Save any clots to show your health care provider. °· You have a foul smelling discharge from your vagina. °· You have trouble urinating. °· You are urinating frequently. °· You have pain when you urinate. °· You have a change in your bowel movements. °· You have increasing redness, pain, or swelling near your vaginal incision (episiotomy) or vaginal tear. °· You have pus draining from your episiotomy or vaginal tear. °· Your episiotomy or vaginal tear is separating. °· You have painful, hard, or reddened breasts. °· You have a severe headache. °· You have blurred vision or see spots. °· You feel sad or depressed. °· You have thoughts of hurting yourself or your newborn. °· You have questions about your care, the care of your newborn, or medicines. °· You are dizzy or light-headed. °· You have a rash. °· You have nausea or vomiting. °· You were breastfeeding and have not had a menstrual period within 12 weeks after you stopped breastfeeding. °· You are not breastfeeding and have not had a menstrual period by the 12th week after delivery. °· You have a fever. °SEEK IMMEDIATE MEDICAL CARE IF:  °· You have persistent pain. °· You have chest pain. °· You have shortness of breath. °· You faint. °· You   have leg pain.  You have stomach pain.  Your vaginal bleeding saturates two or more sanitary pads in 1 hour.    Call your doctor for increased pain or vaginal bleeding, temperature above 100.4, depression, or concerns.  No strenuous activity or heavy lifting for 6 weeks.  No intercourse, tampons, douching, or enemas for 6 weeks.   No tub baths-showers only.  No driving for 2 weeks or while taking pain medications.  Continue prenatal vitamin and iron.  Increase calories and fluids while breastfeeding.

## 2015-08-07 LAB — SURGICAL PATHOLOGY

## 2015-12-11 ENCOUNTER — Ambulatory Visit
Admission: RE | Admit: 2015-12-11 | Discharge: 2015-12-11 | Disposition: A | Discharge status: Home / Self Care | Payer: 59 | Source: Ambulatory Visit | Attending: Obstetrics & Gynecology | Admitting: Obstetrics & Gynecology

## 2015-12-11 ENCOUNTER — Other Ambulatory Visit: Payer: Self-pay | Admitting: Obstetrics & Gynecology

## 2015-12-11 DIAGNOSIS — T8339XA Other mechanical complication of intrauterine contraceptive device, initial encounter: Secondary | ICD-10-CM

## 2015-12-11 DIAGNOSIS — X58XXXA Exposure to other specified factors, initial encounter: Secondary | ICD-10-CM | POA: Insufficient documentation

## 2016-04-08 ENCOUNTER — Other Ambulatory Visit: Payer: Self-pay | Admitting: Obstetrics & Gynecology

## 2016-04-08 DIAGNOSIS — T8332XS Displacement of intrauterine contraceptive device, sequela: Secondary | ICD-10-CM

## 2016-04-11 ENCOUNTER — Ambulatory Visit
Admission: RE | Admit: 2016-04-11 | Discharge: 2016-04-11 | Disposition: A | Discharge status: Home / Self Care | Payer: 59 | Source: Ambulatory Visit | Attending: Obstetrics & Gynecology | Admitting: Obstetrics & Gynecology

## 2016-04-11 DIAGNOSIS — Z975 Presence of (intrauterine) contraceptive device: Secondary | ICD-10-CM | POA: Diagnosis not present

## 2016-04-11 DIAGNOSIS — T8332XA Displacement of intrauterine contraceptive device, initial encounter: Secondary | ICD-10-CM | POA: Insufficient documentation

## 2016-04-11 DIAGNOSIS — T8332XS Displacement of intrauterine contraceptive device, sequela: Secondary | ICD-10-CM

## 2016-04-18 ENCOUNTER — Encounter
Admission: RE | Admit: 2016-04-18 | Discharge: 2016-04-18 | Disposition: A | Discharge status: Home / Self Care | Payer: 59 | Source: Ambulatory Visit | Attending: Obstetrics & Gynecology | Admitting: Obstetrics & Gynecology

## 2016-04-18 HISTORY — DX: Headache: R51

## 2016-04-18 HISTORY — DX: Headache, unspecified: R51.9

## 2016-04-18 HISTORY — DX: Anemia, unspecified: D64.9

## 2016-04-18 NOTE — Patient Instructions (Signed)
  Your procedure is scheduled on: 04-19-16 Report to Same Day Surgery 2nd floor medical mall @ 12 PM PER PT   Remember: Instructions that are not followed completely may result in serious medical risk, up to and including death, or upon the discretion of your surgeon and anesthesiologist your surgery may need to be rescheduled.    _x___ 1. Do not eat food or drink liquids after midnight. No gum chewing or hard candies.     __x__ 2. No Alcohol for 24 hours before or after surgery.   __x__3. No Smoking for 24 prior to surgery.   ____  4. Bring all medications with you on the day of surgery if instructed.    __x__ 5. Notify your doctor if there is any change in your medical condition     (cold, fever, infections).     Do not wear jewelry, make-up, hairpins, clips or nail polish.  Do not wear lotions, powders, or perfumes. You may wear deodorant.  Do not shave 48 hours prior to surgery. Men may shave face and neck.  Do not bring valuables to the hospital.    [facility] for any belongings or valuables.               Contacts, dentures or bridgework may not be worn into surgery.  Leave your suitcase in the car. After surgery it may be brought to your room.  For patients admitted to the hospital, discharge time is determined by your treatment team.   Patients discharged the day of surgery will not be allowed to drive home.    Please read over the following fact sheets that you were given:   [facility] Preparing [facility] and or MRSA Information   ____ Take these medicines the morning of surgery with A SIP OF WATER:    1. NONE  2.  3.  4.  5.  6.  ____Fleets enema or Magnesium Citrate as directed.   ____ Use CHG Soap or sage wipes as directed on instruction sheet   ____ Use inhalers on the day of surgery and bring to hospital day of surgery  ____ Stop metformin 2 days prior to surgery    ____ Take 1/2 of usual insulin dose the night before surgery and  none on the morning of  surgery.   __X__ Stop aspirin or coumadin, or plavix -STOP ALKA-SELTZER NOW  x__ Stop Anti-inflammatories such as Advil, Aleve, Ibuprofen, Motrin, Naproxen,          Naprosyn, Goodies powders or aspirin products-STOP IBUPROFEN NOW- Ok to take Tylenol.   ____ Stop supplements until after surgery.    ____ Bring C-Pap to the hospital.

## 2016-04-19 ENCOUNTER — Encounter
Admission: RE | Disposition: A | Discharge status: Home / Self Care | Payer: Self-pay | Source: Ambulatory Visit | Attending: Obstetrics & Gynecology

## 2016-04-19 ENCOUNTER — Ambulatory Visit
Admission: RE | Admit: 2016-04-19 | Discharge: 2016-04-19 | Disposition: A | Discharge status: Home / Self Care | Payer: 59 | Source: Ambulatory Visit | Attending: Obstetrics & Gynecology | Admitting: Obstetrics & Gynecology

## 2016-04-19 ENCOUNTER — Ambulatory Visit: Payer: 59 | Admitting: Anesthesiology

## 2016-04-19 ENCOUNTER — Ambulatory Visit: Payer: 59

## 2016-04-19 DIAGNOSIS — Y768 Miscellaneous obstetric and gynecological devices associated with adverse incidents, not elsewhere classified: Secondary | ICD-10-CM | POA: Diagnosis not present

## 2016-04-19 DIAGNOSIS — Z30432 Encounter for removal of intrauterine contraceptive device: Secondary | ICD-10-CM | POA: Diagnosis present

## 2016-04-19 DIAGNOSIS — A6000 Herpesviral infection of urogenital system, unspecified: Secondary | ICD-10-CM | POA: Insufficient documentation

## 2016-04-19 DIAGNOSIS — T8389XA Other specified complication of genitourinary prosthetic devices, implants and grafts, initial encounter: Secondary | ICD-10-CM | POA: Insufficient documentation

## 2016-04-19 DIAGNOSIS — Z419 Encounter for procedure for purposes other than remedying health state, unspecified: Secondary | ICD-10-CM

## 2016-04-19 DIAGNOSIS — G43909 Migraine, unspecified, not intractable, without status migrainosus: Secondary | ICD-10-CM | POA: Diagnosis not present

## 2016-04-19 DIAGNOSIS — T839XXA Unspecified complication of genitourinary prosthetic device, implant and graft, initial encounter: Secondary | ICD-10-CM

## 2016-04-19 HISTORY — PX: IUD REMOVAL: SHX5392

## 2016-04-19 HISTORY — PX: LAPAROSCOPY: SHX197

## 2016-04-19 HISTORY — PX: HYSTEROSCOPY WITH D & C: SHX1775

## 2016-04-19 HISTORY — DX: Unspecified complication of genitourinary prosthetic device, implant and graft, initial encounter: T83.9XXA

## 2016-04-19 LAB — CBC
HEMATOCRIT: 40.8 % (ref 35.0–47.0)
HEMOGLOBIN: 13 g/dL (ref 12.0–16.0)
MCH: 26.7 pg (ref 26.0–34.0)
MCHC: 31.9 g/dL — ABNORMAL LOW (ref 32.0–36.0)
MCV: 83.6 fL (ref 80.0–100.0)
Platelets: 312 10*3/uL (ref 150–440)
RBC: 4.88 MIL/uL (ref 3.80–5.20)
RDW: 14.1 % (ref 11.5–14.5)
WBC: 7.6 10*3/uL (ref 3.6–11.0)

## 2016-04-19 LAB — TYPE AND SCREEN
ABO/RH(D): O POS
Antibody Screen: NEGATIVE

## 2016-04-19 LAB — POCT PREGNANCY, URINE: PREG TEST UR: NEGATIVE

## 2016-04-19 SURGERY — DILATATION AND CURETTAGE /HYSTEROSCOPY
Anesthesia: General | Wound class: Clean Contaminated

## 2016-04-19 MED ORDER — LACTATED RINGERS IV BOLUS (SEPSIS)
500.0000 mL | Freq: Once | INTRAVENOUS | Status: AC
  Administered 2016-04-19: 500 mL via INTRAVENOUS

## 2016-04-19 MED ORDER — LIDOCAINE HCL (CARDIAC) 20 MG/ML IV SOLN
INTRAVENOUS | Status: DC | PRN
  Administered 2016-04-19: 80 mg via INTRAVENOUS

## 2016-04-19 MED ORDER — FERRIC SUBSULFATE 259 MG/GM EX SOLN
CUTANEOUS | Status: AC
  Filled 2016-04-19: qty 8

## 2016-04-19 MED ORDER — GLYCOPYRROLATE 0.2 MG/ML IJ SOLN
INTRAMUSCULAR | Status: DC | PRN
  Administered 2016-04-19: 0.2 mg via INTRAVENOUS

## 2016-04-19 MED ORDER — OXYCODONE HCL 5 MG PO TABS
ORAL_TABLET | ORAL | Status: AC
  Filled 2016-04-19: qty 1

## 2016-04-19 MED ORDER — ACETAMINOPHEN 650 MG RE SUPP: 650.0000 mg | RECTAL | Status: DC | PRN

## 2016-04-19 MED ORDER — OXYCODONE HCL 5 MG/5ML PO SOLN: 5.0000 mg | Freq: Once | ORAL | Status: AC | PRN

## 2016-04-19 MED ORDER — MIDAZOLAM HCL 2 MG/2ML IJ SOLN
INTRAMUSCULAR | Status: DC | PRN
  Administered 2016-04-19: 2 mg via INTRAVENOUS

## 2016-04-19 MED ORDER — OXYCODONE HCL 5 MG PO TABS
5.0000 mg | ORAL_TABLET | Freq: Once | ORAL | Status: AC | PRN
  Administered 2016-04-19: 5 mg via ORAL

## 2016-04-19 MED ORDER — BUPIVACAINE HCL 0.5 % IJ SOLN
INTRAMUSCULAR | Status: DC | PRN
  Administered 2016-04-19: 10 mL

## 2016-04-19 MED ORDER — SUGAMMADEX SODIUM 200 MG/2ML IV SOLN
INTRAVENOUS | Status: DC | PRN
  Administered 2016-04-19: 150 mg via INTRAVENOUS

## 2016-04-19 MED ORDER — DEXAMETHASONE SODIUM PHOSPHATE 10 MG/ML IJ SOLN
INTRAMUSCULAR | Status: DC | PRN
  Administered 2016-04-19: 5 mg via INTRAVENOUS

## 2016-04-19 MED ORDER — ACETAMINOPHEN 325 MG PO TABS: 650.0000 mg | ORAL_TABLET | ORAL | Status: DC | PRN

## 2016-04-19 MED ORDER — PROPOFOL 10 MG/ML IV BOLUS
INTRAVENOUS | Status: DC | PRN
  Administered 2016-04-19: 170 mg via INTRAVENOUS

## 2016-04-19 MED ORDER — ROCURONIUM BROMIDE 100 MG/10ML IV SOLN
INTRAVENOUS | Status: DC | PRN
  Administered 2016-04-19: 35 mg via INTRAVENOUS

## 2016-04-19 MED ORDER — BUPIVACAINE HCL (PF) 0.5 % IJ SOLN
INTRAMUSCULAR | Status: AC
  Filled 2016-04-19: qty 30

## 2016-04-19 MED ORDER — FENTANYL CITRATE (PF) 100 MCG/2ML IJ SOLN
INTRAMUSCULAR | Status: DC | PRN
  Administered 2016-04-19: 100 ug via INTRAVENOUS
  Administered 2016-04-19: 50 ug via INTRAVENOUS

## 2016-04-19 MED ORDER — FENTANYL CITRATE (PF) 100 MCG/2ML IJ SOLN
INTRAMUSCULAR | Status: AC
  Filled 2016-04-19: qty 2

## 2016-04-19 MED ORDER — FAMOTIDINE 20 MG PO TABS
ORAL_TABLET | ORAL | Status: AC
  Administered 2016-04-19: 20 mg via ORAL
  Filled 2016-04-19: qty 1

## 2016-04-19 MED ORDER — FENTANYL CITRATE (PF) 100 MCG/2ML IJ SOLN
25.0000 ug | INTRAMUSCULAR | Status: DC | PRN
  Administered 2016-04-19 (×2): 50 ug via INTRAVENOUS

## 2016-04-19 MED ORDER — LACTATED RINGERS IV SOLN
INTRAVENOUS | Status: DC
  Administered 2016-04-19: 13:00:00 via INTRAVENOUS

## 2016-04-19 MED ORDER — MORPHINE SULFATE (PF) 2 MG/ML IV SOLN: 1.0000 mg | INTRAVENOUS | Status: DC | PRN

## 2016-04-19 MED ORDER — OXYCODONE-ACETAMINOPHEN 5-325 MG PO TABS: 1.0000 | ORAL_TABLET | ORAL | 0 refills | Status: DC | PRN

## 2016-04-19 MED ORDER — PHENYLEPHRINE HCL 10 MG/ML IJ SOLN
INTRAMUSCULAR | Status: DC | PRN
  Administered 2016-04-19 (×5): 100 ug via INTRAVENOUS

## 2016-04-19 MED ORDER — FAMOTIDINE 20 MG PO TABS
20.0000 mg | ORAL_TABLET | Freq: Once | ORAL | Status: AC
  Administered 2016-04-19: 20 mg via ORAL

## 2016-04-19 MED ORDER — KETOROLAC TROMETHAMINE 30 MG/ML IJ SOLN: 30.0000 mg | Freq: Four times a day (QID) | INTRAMUSCULAR | Status: DC

## 2016-04-19 SURGICAL SUPPLY — 48 items
ABLATOR ENDOMETRIAL MYOSURE (ABLATOR) ×2 IMPLANT
BAG COUNTER SPONGE EZ (MISCELLANEOUS) ×2 IMPLANT
BLADE SURG SZ11 CARB STEEL (BLADE) ×2 IMPLANT
CANISTER SUC SOCK COL 7IN (MISCELLANEOUS) ×2 IMPLANT
CANISTER SUCT 1200ML W/VALVE (MISCELLANEOUS) ×2 IMPLANT
CATH ROBINSON RED A/P 16FR (CATHETERS) ×2 IMPLANT
CHLORAPREP W/TINT 26ML (MISCELLANEOUS) ×2 IMPLANT
DEVICE MYOSURE LITE (MISCELLANEOUS) ×2 IMPLANT
DRESSING TELFA 4X3 1S ST N-ADH (GAUZE/BANDAGES/DRESSINGS) ×2 IMPLANT
DRSG TEGADERM 2-3/8X2-3/4 SM (GAUZE/BANDAGES/DRESSINGS) ×6 IMPLANT
DRSG TEGADERM 2X2.25 PEDS (GAUZE/BANDAGES/DRESSINGS) ×2 IMPLANT
ELECT REM PT RETURN 9FT ADLT (ELECTROSURGICAL) ×2
ELECTRODE REM PT RTRN 9FT ADLT (ELECTROSURGICAL) ×1 IMPLANT
GAUZE SPONGE NON-WVN 2X2 STRL (MISCELLANEOUS) ×2 IMPLANT
GLOVE BIO SURGEON STRL SZ7.5 (GLOVE) ×2 IMPLANT
GLOVE BIO SURGEON STRL SZ8 (GLOVE) ×2 IMPLANT
GLOVE INDICATOR 8.0 STRL GRN (GLOVE) ×2 IMPLANT
GOWN STRL REUS W/ TWL LRG LVL3 (GOWN DISPOSABLE) ×1 IMPLANT
GOWN STRL REUS W/ TWL XL LVL3 (GOWN DISPOSABLE) ×1 IMPLANT
GOWN STRL REUS W/TWL LRG LVL3 (GOWN DISPOSABLE) ×1
GOWN STRL REUS W/TWL XL LVL3 (GOWN DISPOSABLE) ×1
IRRIGATION STRYKERFLOW (MISCELLANEOUS) ×1 IMPLANT
IRRIGATOR STRYKERFLOW (MISCELLANEOUS) ×2
IV LACTATED RINGERS 1000ML (IV SOLUTION) ×2 IMPLANT
LABEL OR SOLS (LABEL) ×2 IMPLANT
LIQUID BAND (GAUZE/BANDAGES/DRESSINGS) ×2 IMPLANT
NEEDLE VERESS 14GA 120MM (NEEDLE) ×2 IMPLANT
NS IRRIG 500ML POUR BTL (IV SOLUTION) ×2 IMPLANT
PACK DNC HYST (MISCELLANEOUS) ×2 IMPLANT
PACK GYN LAPAROSCOPIC (MISCELLANEOUS) ×2 IMPLANT
PAD OB MATERNITY 4.3X12.25 (PERSONAL CARE ITEMS) ×2 IMPLANT
PAD PREP 24X41 OB/GYN DISP (PERSONAL CARE ITEMS) ×2 IMPLANT
SCISSORS METZENBAUM CVD 33 (INSTRUMENTS) ×2 IMPLANT
SHEARS HARMONIC ACE PLUS 36CM (ENDOMECHANICALS) ×2 IMPLANT
SLEEVE ENDOPATH XCEL 5M (ENDOMECHANICALS) ×4 IMPLANT
SOL .9 NS 3000ML IRR  AL (IV SOLUTION) ×1
SOL .9 NS 3000ML IRR UROMATIC (IV SOLUTION) ×1 IMPLANT
SPONGE VERSALON 2X2 STRL (MISCELLANEOUS) ×2
STRAP SAFETY BODY (MISCELLANEOUS) ×2 IMPLANT
SUT VIC AB 2-0 UR6 27 (SUTURE) ×2 IMPLANT
SUT VIC AB 4-0 PS2 18 (SUTURE) ×4 IMPLANT
SYRINGE 10CC LL (SYRINGE) ×4 IMPLANT
TOWEL OR 17X26 4PK STRL BLUE (TOWEL DISPOSABLE) ×2 IMPLANT
TROCAR ENDO BLADELESS 11MM (ENDOMECHANICALS) ×2 IMPLANT
TROCAR XCEL NON-BLD 5MMX100MML (ENDOMECHANICALS) ×2 IMPLANT
TUBING CONNECTING 10 (TUBING) ×2 IMPLANT
TUBING HYSTEROSCOPY DOLPHIN (MISCELLANEOUS) ×2 IMPLANT
TUBING INSUFFLATOR HI FLOW (MISCELLANEOUS) ×2 IMPLANT

## 2016-04-19 NOTE — Anesthesia Procedure Notes (Signed)
Procedure Name: Intubation Date/Time: 04/19/2016 1:28 PM Performed by: Stormy FabianURTIS, Dewanna Hurston Pre-anesthesia Checklist: Patient identified, Patient being monitored, Timeout performed, Emergency Drugs available and Suction available Patient Re-evaluated:Patient Re-evaluated prior to inductionOxygen Delivery Method: Circle system utilized Preoxygenation: Pre-oxygenation with 100% oxygen Intubation Type: IV induction Ventilation: Mask ventilation without difficulty Laryngoscope Size: Mac and 3 Grade View: Grade I Tube type: Oral Tube size: 7.0 mm Number of attempts: 1 Airway Equipment and Method: Stylet Placement Confirmation: ETT inserted through vocal cords under direct vision,  positive ETCO2 and breath sounds checked- equal and bilateral Secured at: 22 cm Tube secured with: Tape Dental Injury: Teeth and Oropharynx as per pre-operative assessment

## 2016-04-19 NOTE — Discharge Instructions (Signed)
General Gynecological Post-Operative Instructions You may expect to feel dizzy, weak, and drowsy for as long as 24 hours after receiving the medicine that made you sleep (anesthetic).  Do not drive a car, ride a bicycle, participate in physical activities, or take public transportation until you are done taking narcotic pain medicines or as directed by your doctor.  Do not drink alcohol or take tranquilizers.  Do not take medicine that has not been prescribed by your doctor.  Do not sign important papers or make important decisions while on narcotic pain medicines.  Have a responsible person with you.  CARE OF INCISION  Keep incision clean and dry. REMOVE BANDAGES in 2 DAYS Take showers instead of baths until your doctor gives you permission to take baths.  Avoid heavy lifting (more than 10 pounds/4.5 kilograms), pushing, or pulling.  Avoid activities that may risk injury to your surgical site.  No sexual intercourse or placement of anything in the vagina for 2 weeks or as instructed by your doctor. If you have tubes coming from the wound site, check with your doctor regarding appropriate care of the tubes. Only take prescription or over-the-counter medicines  for pain, discomfort, or fever as directed by your doctor. Do not take aspirin. It can make you bleed. Take medicines (antibiotics) that kill germs if they are prescribed for you.  Call the office or go to the ER if:  You feel sick to your stomach (nauseous) and you start to throw up (vomit).  You have trouble eating or drinking.  You have an oral temperature above 101.  You have constipation that is not helped by adjusting diet or increasing fluid intake. Pain medicines are a common cause of constipation.  You have any other concerns. SEEK IMMEDIATE MEDICAL CARE IF:  You have persistent dizziness.  You have difficulty breathing or a congested sounding (croupy) cough.  You have an oral temperature above 102.5, not controlled by  medicine.  There is increasing pain or tenderness near or in the surgical site.         AMBULATORY SURGERY  DISCHARGE INSTRUCTIONS   1) The drugs that you were given will stay in your system until tomorrow so for the next 24 hours you should not:  A) Drive an automobile B) Make any legal decisions C) Drink any alcoholic beverage   2) You may resume regular meals tomorrow.  Today it is better to start with liquids and gradually work up to solid foods.  You may eat anything you prefer, but it is better to start with liquids, then soup and crackers, and gradually work up to solid foods.   3) Please notify your doctor immediately if you have any unusual bleeding, trouble breathing, redness and pain at the surgery site, drainage, fever, or pain not relieved by medication.    4) Additional Instructions:        Please contact your physician with any problems or Same Day Surgery at [phone], Monday through Friday 6 am to 4 pm, or [place] [facility] Main number at [phone].

## 2016-04-19 NOTE — H&P (Signed)
History and Physical Interval Note:  04/19/2016 12:59 PM  Molly Graves  has presented today for surgery, with the diagnosis of RETAINED IUD WITH POSSIBLE PERFORATION  The various methods of treatment have been discussed with the patient and family. After consideration of risks, benefits and other options for treatment, the patient has consented to  Procedure(s): DILATATION AND CURETTAGE /HYSTEROSCOPY (N/A) LAPAROSCOPY DIAGNOSTIC (N/A) INTRAUTERINE DEVICE (IUD) REMOVAL (N/A) as a surgical intervention .  The patient's history has been reviewed, patient examined, no change in status, stable for surgery.  Pt has the following beta blocker history-  Not taking Beta Blocker.  I have reviewed the patient's chart and labs.  Questions were answered to the patient's satisfaction.    Letitia Libraobert Paul Monifah Freehling

## 2016-04-19 NOTE — Anesthesia Preprocedure Evaluation (Signed)
Anesthesia Evaluation  Patient identified by MRN, date of birth, ID band Patient awake    Reviewed: Allergy & Precautions, H&P , NPO status , Patient's Chart, lab work & pertinent test results  History of Anesthesia Complications Negative for: history of anesthetic complications  Airway Mallampati: I  TM Distance: >3 FB Neck ROM: full    Dental no notable dental hx. (+) Teeth Intact   Pulmonary neg shortness of breath, Current Smoker,    Pulmonary exam normal breath sounds clear to auscultation       Cardiovascular Exercise Tolerance: Good (-) angina(-) Past MI and (-) DOE negative cardio ROS Normal cardiovascular exam Rhythm:regular Rate:Normal     Neuro/Psych  Headaches, PSYCHIATRIC DISORDERS    GI/Hepatic negative GI ROS, Neg liver ROS,   Endo/Other  negative endocrine ROS  Renal/GU      Musculoskeletal   Abdominal   Peds  Hematology negative hematology ROS (+)   Anesthesia Other Findings Past Medical History: No date: Anemia No date: Depression     Comment: post partum depression No date: Headache     Comment: MIGRAINES 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  Past Surgical History: No date: BREAST SURGERY     Comment: lumpectomy left breast 2015: DIAGNOSTIC LAPAROSCOPY     Comment: to rule out uterine injury after D&E 2015: INDUCED ABORTION  BMI    Body Mass Index:  31.00 kg/m      Reproductive/Obstetrics negative OB ROS                             Anesthesia Physical Anesthesia Plan  ASA: III  Anesthesia Plan: General ETT   Post-op Pain Management:    Induction:   Airway Management Planned:   Additional Equipment:   Intra-op Plan:   Post-operative Plan:   Informed Consent: I have reviewed the patients History and Physical, chart, labs and discussed the procedure including the risks, benefits and alternatives for the proposed anesthesia with the patient or authorized  representative who has indicated his/her understanding and acceptance.     Plan Discussed with: Anesthesiologist, CRNA and Surgeon  Anesthesia Plan Comments:         Anesthesia Quick Evaluation

## 2016-04-19 NOTE — Op Note (Signed)
Operative Note   04/19/2016  PRE-OP DIAGNOSIS: Retained Intrauterine Device   POST-OP DIAGNOSIS: Perforated Intrauterine Device  SURGEON: Annamarie MajorPaul Veronique Warga, MD, FACOG   PROCEDURE: Procedure(s): DILATATION AND CURETTAGE /HYSTEROSCOPY LAPAROSCOPY DIAGNOSTIC INTRAUTERINE DEVICE (IUD) REMOVAL  ANESTHESIA: General   ESTIMATED BLOOD LOSS: min   SPECIMENS: IUD   FLUID DEFICIT: min   COMPLICATIONS: none   DISPOSITION: PACU - hemodynamically stable.   CONDITION: stable   FINDINGS: Exam under anesthesia revealed small, mobile small uterus with no masses and bilateral adnexa without masses or fullness. Hysteroscopy revealed normal lining and No IUD seen, otherwise grossly normal appearing uterine cavity with bilateral tubal ostia and normal appearing endocervical canal. Laparoscopy revealed normal tubes, ovaries, uterus. IUD seen in posterior cul de sac freely removable.   PROCEDURE IN DETAIL: After informed consent was obtained, the patient was taken to the operating room where anesthesia was obtained without difficulty. The patient was positioned in the dorsal lithotomy position in [place] stirrups. The patient's bladder was catheterized with an in and out foley catheter. The patient was examined under anesthesia, with the above noted findings. The weightedspeculum was placed inside the patient's vagina, and the the anterior lip of the cervix was seen and grasped with the tenaculum.  The uterine cavity was sounded to 9cm.  The cervix was progressively dilated to a 18 French-Pratt dilator. The 30 degree hysteroscope was introduced, with LR fluid used to distend the intrauterine cavity, with the above noted findings. Hysteroscope removed. A Hulka Tenaculum was placed.  Attention is then turned to the abdomen where a veress needle is placed through an infraumbilical incision after Marcaine is used to anesthesize the skin.  Veress needle placement is confirmed using the hanging drop technique and the  abdomen is then insufflated w CO2 gas.  A 5mm trocar is then placed under direct visualization with the laparoscope and the above mentioned findings seen.  An additional 5mm  trocar is placed in the RLQ lateral to the inferior epigastric blood vessels with no injuries or bleeding noted.  Then IUD is seen in the posterior cul de sac and easily grasped and removed.  Uterus is inspected with no defects or active perforations or bleeding sites noted.  Gas is expelled and trocars removed with skin closure using Dermabond.  Tenaculum was removed with excellent hemostasis noted. She was then taken out of dorsal lithotomy. Minimal discrepancy in fluid was noted. No bleeding.  The patient tolerated the procedure well. Sponge, lap and needle counts were correct x2. The patient was taken to recovery room in excellent condition.

## 2016-04-20 NOTE — Transfer of Care (Signed)
Immediate Anesthesia Transfer of Care Note  Patient: Molly Graves  Procedure(s) Performed: Procedure(s): DILATATION AND CURETTAGE /HYSTEROSCOPY (N/A) LAPAROSCOPY DIAGNOSTIC (N/A) INTRAUTERINE DEVICE (IUD) REMOVAL (N/A)  Patient Location: PACU  Anesthesia Type:General  Level of Consciousness: sedated  Airway & Oxygen Therapy: Patient Spontanous Breathing and Patient connected to face mask oxygen  Post-op Assessment: Report given to RN and Post -op Vital signs reviewed and stable  Post vital signs: Reviewed and stable  Last Vitals:  Vitals:   04/19/16 1712 04/19/16 1744  BP: 115/63 (!) 108/59  Pulse: 66 65  Resp: 16 16  Temp: 36.6 C     Complications: No apparent anesthesia complications

## 2016-04-22 NOTE — Anesthesia Postprocedure Evaluation (Signed)
Anesthesia Post Note  Patient: Molly Graves  Procedure(s) Performed: Procedure(s) (LRB): DILATATION AND CURETTAGE /HYSTEROSCOPY (N/A) LAPAROSCOPY DIAGNOSTIC (N/A) INTRAUTERINE DEVICE (IUD) REMOVAL (N/A)  Patient location during evaluation: PACU Anesthesia Type: General Level of consciousness: awake and alert Pain management: pain level controlled Vital Signs Assessment: post-procedure vital signs reviewed and stable Respiratory status: spontaneous breathing, nonlabored ventilation, respiratory function stable and patient connected to nasal cannula oxygen Cardiovascular status: blood pressure returned to baseline and stable Postop Assessment: no signs of nausea or vomiting Anesthetic complications: no    Last Vitals:  Vitals:   04/19/16 1712 04/19/16 1744  BP: 115/63 (!) 108/59  Pulse: 66 65  Resp: 16 16  Temp: 36.6 C     Last Pain:  Vitals:   04/22/16 0813  TempSrc:   PainSc: 0-No pain                 Cleda MccreedyJoseph K Piscitello

## 2016-04-24 ENCOUNTER — Encounter: Payer: Self-pay | Admitting: Obstetrics & Gynecology

## 2016-12-14 ENCOUNTER — Encounter: Payer: Self-pay | Admitting: Emergency Medicine

## 2016-12-14 ENCOUNTER — Emergency Department
Admission: EM | Admit: 2016-12-14 | Discharge: 2016-12-14 | Disposition: A | Discharge status: Home / Self Care | Payer: 59 | Attending: Emergency Medicine | Admitting: Emergency Medicine

## 2016-12-14 DIAGNOSIS — K047 Periapical abscess without sinus: Secondary | ICD-10-CM | POA: Insufficient documentation

## 2016-12-14 DIAGNOSIS — R600 Localized edema: Secondary | ICD-10-CM | POA: Diagnosis not present

## 2016-12-14 DIAGNOSIS — F17290 Nicotine dependence, other tobacco product, uncomplicated: Secondary | ICD-10-CM | POA: Diagnosis not present

## 2016-12-14 DIAGNOSIS — K0889 Other specified disorders of teeth and supporting structures: Secondary | ICD-10-CM | POA: Diagnosis present

## 2016-12-14 MED ORDER — LIDOCAINE-EPINEPHRINE 2 %-1:100000 IJ SOLN
1.7000 mL | Freq: Once | INTRAMUSCULAR | Status: AC
  Administered 2016-12-14: 1.7 mL
  Filled 2016-12-14: qty 1.7

## 2016-12-14 MED ORDER — CLINDAMYCIN HCL 150 MG PO CAPS
300.0000 mg | ORAL_CAPSULE | Freq: Once | ORAL | Status: AC
  Administered 2016-12-14: 300 mg via ORAL
  Filled 2016-12-14: qty 2

## 2016-12-14 MED ORDER — CLINDAMYCIN HCL 300 MG PO CAPS: 300.0000 mg | ORAL_CAPSULE | Freq: Three times a day (TID) | ORAL | 0 refills | Status: AC

## 2016-12-14 MED ORDER — MAGIC MOUTHWASH W/LIDOCAINE: 5.0000 mL | Freq: Four times a day (QID) | ORAL | 0 refills | Status: DC

## 2016-12-14 NOTE — ED Provider Notes (Signed)
[facility] Emergency Department Provider Note  ____________________________________________  Time seen: Approximately 9:38 PM  I have reviewed the triage vital signs and the nursing notes.   HISTORY  Chief Complaint Dental Problem    HPI Molly Graves is a 26 y.o. female who presents emergency department complaining of left lower dental infection. Patient reports that she had a root canal and was supposed to return for a cat which she did not. Patient reports pains and swelling to the left lower dentition. No fevers or chills. No difficulty breathing. No recent antibiotic use. No other components this time.   Past Medical History:  Diagnosis Date  . Anemia   . Depression    post partum depression  . Headache    MIGRAINES  . Herpes genitalia     Patient Active Problem List   Diagnosis Date Noted  . Complication of intrauterine device (IUD) (HCC) 04/19/2016    Past Surgical History:  Procedure Laterality Date  . BREAST SURGERY     lumpectomy left breast  . DIAGNOSTIC LAPAROSCOPY  2015   to rule out uterine injury after D&E  . HYSTEROSCOPY W/D&C N/A 04/19/2016   Procedure: DILATATION AND CURETTAGE /HYSTEROSCOPY;  Surgeon: Nadara Mustard, MD;  Location: [facility] ORS;  Service: Gynecology;  Laterality: N/A;  . INDUCED ABORTION  2015  . IUD REMOVAL N/A 04/19/2016   Procedure: INTRAUTERINE DEVICE (IUD) REMOVAL;  Surgeon: Nadara Mustard, MD;  Location: [facility] ORS;  Service: Gynecology;  Laterality: N/A;  . LAPAROSCOPY N/A 04/19/2016   Procedure: LAPAROSCOPY DIAGNOSTIC;  Surgeon: Nadara Mustard, MD;  Location: [facility] OR[facility];  Service: Gynecology;  Laterality: N/A;    Prior to Admission medications   Medication Sig Start Date End Date Taking? Authorizing Provider  Chlorphen-Phenyleph-ASA (ALKA-SELTZER PLUS COLD PO) Take 1 capsule by mouth daily as needed (cold symptoms).    [provider]  clindamycin (CLEOCIN) 300 MG capsule Take 1 capsule (300 mg  total) by mouth 3 (three) times daily. 12/14/16 12/24/16  Cuthriell, Delorise Royals, PA-C  ibuprofen (ADVIL,MOTRIN) 200 MG tablet Take 400 mg by mouth every 6 (six) hours as needed for headache or moderate pain.    [provider]  magic mouthwash w/lidocaine SOLN Take 5 mLs by mouth 4 (four) times daily. 12/14/16   Cuthriell, Delorise Royals, PA-C  oxyCODONE-acetaminophen (PERCOCET) 5-325 MG tablet Take 1 tablet by mouth every 4 (four) hours as needed for moderate pain or severe pain. 04/19/16   Nadara Mustard, MD  Simethicone (GAS RELIEF PO) Take 1-2 tablets by mouth daily as needed (gas).    [provider]    Allergies Patient has no known allergies.  History reviewed. No pertinent family history.  Social History Social History  Substance Use Topics  . Smoking status: Current Some Day Smoker    Years: 7.00    Types: Cigars  . Smokeless tobacco: Never Used  . Alcohol use Yes     Comment: OCC     Review of Systems  Constitutional: No fever/chills Eyes: No visual changes. No discharge ENT: Positive for left lower dental infection Cardiovascular: no chest pain. Respiratory: no cough. No SOB. Gastrointestinal: No abdominal pain.  No nausea, no vomiting.  Musculoskeletal: Negative for musculoskeletal pain. Skin: Negative for rash, abrasions, lacerations, ecchymosis. Neurological: Negative for headaches, focal weakness or numbness. 10-point ROS otherwise negative.  ____________________________________________   PHYSICAL EXAM:  VITAL SIGNS: ED Triage Vitals  Enc Vitals Group     BP 12/14/16 2031 119/68  Pulse Rate 12/14/16 2031 71     Resp 12/14/16 2031 18     Temp 12/14/16 2031 99 F (37.2 C)     Temp Source 12/14/16 2031 Oral     SpO2 12/14/16 2031 98 %     Weight 12/14/16 2033 175 lb (79.4 kg)     Height 12/14/16 2033 5\' 4"  (1.626 m)     Head Circumference --      Peak Flow --      Pain Score 12/14/16 2030 5     Pain Loc --      Pain Edu? --       Excl. in GC? --      Constitutional: Alert and oriented. Well appearing and in no acute distress. Eyes: Conjunctivae are normal. PERRL. EOMI. Head: Atraumatic. ENT:      Ears:       Nose: No congestion/rhinnorhea.      Mouth/Throat: Mucous membranes are moist. Erythema and edema surrounding the second molar of the left lower dentition. No palpable abscess. Minor soft tissue swelling is noted in the cheek. No induration or fluctuance. Oropharynx is nonerythematous and nonedematous. Uvula is midline. Neck: No stridor.   Hematological/Lymphatic/Immunilogical: No cervical lymphadenopathy. Cardiovascular: Normal rate, regular rhythm. Normal S1 and S2.  Good peripheral circulation. Respiratory: Normal respiratory effort without tachypnea or retractions. Lungs CTAB. Good air entry to the bases with no decreased or absent breath sounds. Musculoskeletal: Full range of motion to all extremities. No gross deformities appreciated. Neurologic:  Normal speech and language. No gross focal neurologic deficits are appreciated.  Skin:  Skin is warm, dry and intact. No rash noted. Psychiatric: Mood and affect are normal. Speech and behavior are normal. Patient exhibits appropriate insight and judgement.   ____________________________________________   LABS (all labs ordered are listed, but only abnormal results are displayed)  Labs Reviewed - No data to display ____________________________________________  EKG   ____________________________________________  RADIOLOGY   No results found.  ____________________________________________    PROCEDURES  Procedure(s) performed:    Procedures  Digital block is performed using 1.7 mL of lidocaine with epinephrine. This is injected into the inferior alveolar nerve. Injection used 25-gauge 1-1/2 inch needle. Patient tolerated well with no complications.  Medications  lidocaine-EPINEPHrine (XYLOCAINE W/EPI) 2 %-1:100000 (with pres) injection 1.7  mL (not administered)  clindamycin (CLEOCIN) capsule 300 mg (300 mg Oral Given 12/14/16 2156)     ____________________________________________   INITIAL IMPRESSION / ASSESSMENT AND PLAN / ED COURSE  Pertinent labs & imaging results that were available during my care of the patient were reviewed by me and considered in my medical decision making (see chart for details).  Review of the [place] CSRS was performed in accordance of the [facility] prior to dispensing any controlled drugs.     Patient's diagnosis is consistent with dental infection. Dental block is performed for symptom control. No indication for abscess requiring labs or imaging.. Patient will be discharged home with prescriptions for clindamycin and magic mouthwash. Patient is to follow up with primary care or dentist as needed or otherwise directed. Patient is given ED precautions to return to the ED for any worsening or new symptoms.     ____________________________________________  FINAL CLINICAL IMPRESSION(S) / ED DIAGNOSES  Final diagnoses:  Dental infection      NEW MEDICATIONS STARTED DURING THIS VISIT:  New Prescriptions   CLINDAMYCIN (CLEOCIN) 300 MG CAPSULE    Take 1 capsule (300 mg total) by mouth 3 (three) times daily.  MAGIC MOUTHWASH W/LIDOCAINE SOLN    Take 5 mLs by mouth 4 (four) times daily.        This chart was dictated using voice recognition software/Dragon. Despite best efforts to proofread, errors can occur which can change the meaning. Any change was purely unintentional.    Racheal Patches, PA-C 12/14/16 2209    Phineas Semen, MD 12/14/16 [phone]

## 2016-12-14 NOTE — ED Triage Notes (Signed)
Pt presents to ED 46 c/o dental pain; pt states she had an incomplete root canal, was supposed to go back to have a cap put on it, but she never went back; pt has an abscess of the pre-molar on the lower left. Pt states pain at 5/10 at this time. Pt is awake, alert and oriented x4.

## 2016-12-14 NOTE — Discharge Instructions (Signed)
OPTIONS FOR DENTAL FOLLOW UP CARE ° °[place] Department of Health and Human Services - Local Safety Net Dental Clinics °http://www.ncdhhs.gov/dph/oralhealth/services/safetynetclinics.htm °  °[facility] (336-562-3123) ° °[facility] [place] ([phone]) ° °[facility] ([phone] ext [phone]) ° °[facility] ([phone]) ° °[facility] ([phone]) °This clinic caters to the indigent population and is on a lottery system. °Location: °[facility], [address], [place] °Clinic Hours: °Wednesdays from 6pm - 9pm, patients seen by a lottery system. °For dates, call or go to [email or web address]/[facility]/[email or web address] °Services: °Cleanings, fillings and simple extractions. °Payment Options: °DENTAL WORK IS FREE OF CHARGE. Bring proof of income or support. °Best way to get seen: °Arrive at 5:15 pm - this is a lottery, NOT first come/first serve, so arriving earlier will not increase your chances of being seen. °  °  °[facility] °[phone] °Select option 1 for emergencies °  °Location: °[facility], [address], [place] °Clinic Hours: °No walk-ins accepted - call the day before to schedule an appointment. °Check in times are 9:30 am and 1:30 pm. °Services: °Simple extractions, temporary fillings, pulpectomy/pulp debridement, uncomplicated abscess drainage. °Payment Options: °PAYMENT IS DUE AT THE TIME OF SERVICE.  Fee is usually $100-200, additional surgical procedures (e.g. abscess drainage) may be extra. °Cash, checks, Visa/MasterCard accepted.  Can file Medicaid if patient is covered for dental - patient should call case worker to check. °No discount for [facility] patients. °Best way to get seen: °MUST call the day before and get onto the schedule. Can usually be seen the next 1-2 days. No walk-ins accepted. °  °  °[facility] °[phone] °   °Location: °[facility], [address], [place] °Clinic Hours: °M, W, Th, F 8am or 1:30pm, Tues 9a or 1:30 - first come/first served. °Services: °Simple extractions, temporary fillings, uncomplicated abscess drainage.  You do not need to be an [place] resident. °Payment Options: °PAYMENT IS DUE AT THE TIME OF SERVICE. °Dental insurance, otherwise sliding scale - bring proof of income or support. °Depending on income and treatment needed, cost is usually $50-200. °Best way to get seen: °Arrive early as it is first come/first served. °  °  °[facility] °[phone] °  °Location: °[address] °Clinic Hours: °Mon-Thu 8a-5p °Services: °Most basic dental services including extractions and fillings. °Payment Options: °PAYMENT IS DUE AT THE TIME OF SERVICE. °Sliding scale, up to 50% off - bring proof if income or support. °Medicaid with dental option accepted. °Best way to get seen: °Call to schedule an appointment, can usually be seen within 2 weeks OR they will try to see walk-ins - show up at 8a or 2p (you may have to wait). °  °  °[facility] °[phone] °[place] RESIDENTS ONLY °  °Location: °[facility], [address], [place], [place] [address] °Clinic Hours: By appointment only. °Monday - Thursday 8am-5pm, Friday 8am-12pm °Services: Cleanings, fillings, extractions. °Payment Options: °PAYMENT IS DUE AT THE TIME OF SERVICE. °Cash, Visa or MasterCard. Sliding scale - $30 minimum per service. °Best way to get seen: °Come in to office, complete packet and make an appointment - need proof of income °or support monies for each household member and proof of [place] residence. °Usually takes about a month to get in. °  °  °[facility] °[phone] °  °Location: °[address]   [place] °Clinic Hours: Walk-in Urgent Care Dental Services are offered Monday-Friday  mornings only. °The numbers of emergencies accepted daily is limited to the number of °providers available. °Maximum 15 - Mondays, Wednesdays & Thursdays °Maximum 10 - Tuesdays & Fridays °Services: °You do not need to be a [place] resident to be seen for a dental emergency. °Emergencies are defined as pain, swelling, abnormal bleeding, or dental trauma. Walkins will receive x-rays if needed. °NOTE: Dental cleaning is not an emergency. °Payment Options: °PAYMENT IS DUE AT THE TIME OF SERVICE. °Minimum co-pay is $40.00 for uninsured patients. °Minimum co-pay is $3.00 for Medicaid with dental coverage. °Dental Insurance is accepted and must be presented at time of visit. °Medicare does not cover dental. °Forms of payment: Cash, credit card, checks. °Best way to get seen: °If not previously registered with the clinic, walk-in dental registration begins at 7:15 am and is on a first come/first serve basis. °If previously registered with the clinic, call to make an appointment. °  °  °[facility] °[phone] °[place] RESIDENTS ONLY °  °Location: °[address], [place], [place] °Clinic Hours: °Mon-Thu 10a-2p °Services: Extractions only! °Payment Options: °FREE (donations accepted) - bring proof of income or support °Best way to get seen: °Call and schedule an appointment OR come at 8am on the 1st Monday of every month (except for holidays) when it is first come/first served. °  °  °[facility] °[phone] °  °Location: °[address], [place] °Clinic Hours: °Friday mornings °Services, Payment Options, Best way to get seen: °Call for info °

## 2017-02-18 ENCOUNTER — Ambulatory Visit (INDEPENDENT_AMBULATORY_CARE_PROVIDER_SITE_OTHER): Payer: 59 | Admitting: Obstetrics and Gynecology

## 2017-02-18 ENCOUNTER — Encounter: Payer: Self-pay | Admitting: Obstetrics and Gynecology

## 2017-02-18 VITALS — BP 112/72 | Ht 63.0 in | Wt 180.0 lb

## 2017-02-18 DIAGNOSIS — N898 Other specified noninflammatory disorders of vagina: Secondary | ICD-10-CM

## 2017-02-18 DIAGNOSIS — Z113 Encounter for screening for infections with a predominantly sexual mode of transmission: Secondary | ICD-10-CM

## 2017-02-18 LAB — POCT WET PREP WITH KOH
Clue Cells Wet Prep HPF POC: NEGATIVE
KOH Prep POC: NEGATIVE
Trichomonas, UA: NEGATIVE
YEAST WET PREP PER HPF POC: NEGATIVE

## 2017-02-18 MED ORDER — FLUCONAZOLE 150 MG PO TABS: 150.0000 mg | ORAL_TABLET | Freq: Once | ORAL | 0 refills | Status: AC

## 2017-02-18 NOTE — Progress Notes (Signed)
Chief Complaint  Patient presents with  . Vaginal Discharge    Would like to be checked for std including blood work    HPI:      [name] is a 26 y.o. [id number] who LMP was Patient's last menstrual period was 02/01/2017., presents today for STD check, just to be safe. No known exposures/specific sx. She has had an increased d/c, with mild odor and itch for the past 2-3 wks. She treated with AZO yeast with temporary relief. She is sex active, no new partners. Using pull out method/no condoms. No fevers. Has had mild LBP, belly pain. No recent abx use.    Past Medical History:  Diagnosis Date  . Anemia   . Depression    post partum depression  . Headache    MIGRAINES  . Herpes genitalia     Past Surgical History:  Procedure Laterality Date  . BREAST SURGERY     lumpectomy left breast  . DIAGNOSTIC LAPAROSCOPY  2015   to rule out uterine injury after D&E  . HYSTEROSCOPY W/D&C N/A 04/19/2016   Procedure: DILATATION AND CURETTAGE /HYSTEROSCOPY;  Surgeon: Nadara Mustard, MD;  Location: [facility];  Service: Gynecology;  Laterality: N/A;  . INDUCED ABORTION  2015  . IUD REMOVAL N/A 04/19/2016   Procedure: INTRAUTERINE DEVICE (IUD) REMOVAL;  Surgeon: Nadara Mustard, MD;  Location: [facility];  Service: Gynecology;  Laterality: N/A;  . LAPAROSCOPY N/A 04/19/2016   Procedure: LAPAROSCOPY DIAGNOSTIC;  Surgeon: Nadara Mustard, MD;  Location: [facility];  Service: Gynecology;  Laterality: N/A;    History reviewed. No pertinent family history.  Social History   Social History  . Marital status: Single    Spouse name: N/A  . Number of children: N/A  . Years of education: N/A   Occupational History  . Not on file.   Social History Main Topics  . Smoking status: Current Some Day Smoker    Years: 7.00    Types: Cigars  . Smokeless tobacco: Never Used  . Alcohol use Yes     Comment: OCC  . Drug use: No  . Sexual activity: Yes   Other Topics Concern  . Not on file    Social History Narrative  . No narrative on file     Current Outpatient Prescriptions:  .  fluconazole (DIFLUCAN) 150 MG tablet, Take 1 tablet (150 mg total) by mouth once., Disp: 1 tablet, Rfl: 0   ROS:  Review of Systems  Constitutional: Negative for fever.  Gastrointestinal: Negative for blood in stool, constipation, diarrhea, nausea and vomiting.  Genitourinary: Positive for vaginal discharge. Negative for dyspareunia, dysuria, flank pain, frequency, hematuria, urgency, vaginal bleeding and vaginal pain.  Musculoskeletal: Positive for back pain.  Skin: Negative for rash.     OBJECTIVE:   Vitals:  BP 112/72   Ht 5\' 3"  (1.6 m)   Wt 180 lb (81.6 kg)   LMP 02/01/2017   BMI 31.89 kg/m   Physical Exam  Constitutional: She is oriented to person, place, and time and well-developed, well-nourished, and in no distress. Vital signs are normal.  Genitourinary: Uterus normal, cervix normal, right adnexa normal, left adnexa normal and vulva normal. Uterus is not enlarged. Cervix exhibits no motion tenderness and no tenderness. Right adnexum displays no mass and no tenderness. Left adnexum displays no mass and no tenderness. Vulva exhibits no erythema, no exudate, no lesion, no rash and no tenderness. Vagina exhibits no lesion. Watery  thin  odorless  white and vaginal discharge found.  Neurological: She is oriented to person, place, and time.  Vitals reviewed.   Results: Results for orders placed or performed in visit on 02/18/17 (from the past 24 hour(s))  POCT Wet Prep with KOH     Status: Normal   Collection Time: 02/18/17  3:00 PM  Result Value Ref Range   Trichomonas, UA Negative    Clue Cells Wet Prep HPF POC neg    Epithelial Wet Prep HPF POC  Few, Moderate, Many, Too numerous to count   Yeast Wet Prep HPF POC neg    Bacteria Wet Prep HPF POC  Few   RBC Wet Prep HPF POC     WBC Wet Prep HPF POC     KOH Prep POC Negative Negative     Assessment/Plan: Screening  for STD (sexually transmitted disease) - Will call pt with results. - Plan: Chlamydia/Gonococcus/Trichomonas, NAA, HIV antibody, HSV 2 antibody, IgG, RPR, Hepatitis C antibody  Vaginal discharge - Neg wet prep/pos exam. Treat empirically with Rx diflucan. Check gon/chlam. F/u prn.  - Plan: Chlamydia/Gonococcus/Trichomonas, NAA, POCT Wet Prep with KOH  Vaginal irritation - Plan: fluconazole (DIFLUCAN) 150 MG tablet, POCT Wet Prep with KOH    Meds ordered this encounter  Medications  . fluconazole (DIFLUCAN) 150 MG tablet    Sig: Take 1 tablet (150 mg total) by mouth once.    Dispense:  1 tablet    Refill:  0      Return if symptoms worsen or fail to improve.  Lupe Bonner B. Davinia Riccardi, PA-C 02/18/2017 3:01 PM

## 2017-02-19 ENCOUNTER — Encounter: Payer: Self-pay | Admitting: Obstetrics and Gynecology

## 2017-02-19 LAB — HEPATITIS C ANTIBODY: Hep C Virus Ab: 0.1 s/co ratio (ref 0.0–0.9)

## 2017-02-19 LAB — RPR: RPR Ser Ql: NONREACTIVE

## 2017-02-19 LAB — HIV ANTIBODY (ROUTINE TESTING W REFLEX): HIV Screen 4th Generation wRfx: NONREACTIVE

## 2017-02-19 LAB — HSV 2 ANTIBODY, IGG: HSV 2 IgG, Type Spec: <0.91 index (ref 0.00–0.90)

## 2017-02-20 LAB — CHLAMYDIA/GONOCOCCUS/TRICHOMONAS, NAA
Chlamydia by NAA: NEGATIVE
GONOCOCCUS BY NAA: NEGATIVE
Trich vag by NAA: NEGATIVE

## 2017-05-01 ENCOUNTER — Ambulatory Visit: Payer: 59 | Admitting: Obstetrics and Gynecology

## 2017-05-01 ENCOUNTER — Encounter: Payer: Self-pay | Admitting: Obstetrics and Gynecology

## 2017-05-01 VITALS — BP 116/58 | HR 75 | Ht 64.0 in | Wt 175.0 lb

## 2017-05-01 DIAGNOSIS — N926 Irregular menstruation, unspecified: Secondary | ICD-10-CM

## 2017-05-01 DIAGNOSIS — Z3201 Encounter for pregnancy test, result positive: Secondary | ICD-10-CM | POA: Diagnosis not present

## 2017-05-01 LAB — POCT URINE PREGNANCY: Preg Test, Ur: POSITIVE — AB

## 2017-05-01 NOTE — Progress Notes (Signed)
Chief Complaint  Patient presents with  . Amenorrhea    HPI:      Molly Graves is a 26 y.o. [id number] who LMP was Patient's last menstrual period was 03/24/2017., presents today for late menses. She is sex active, using pull out method. No spotting, nausea, breast tenderness. She is interested in conception after a 20# wt loss. She is currently doing HCG injections, B12 inj, and phentermine. Not taking PNVs. 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  Vag sx from 9/18 resolved with diflucan tx.   Past Medical History:  Diagnosis Date  . Anemia   . Depression    post partum depression  . Headache    MIGRAINES  . Herpes genitalia     Past Surgical History:  Procedure Laterality Date  . BREAST SURGERY     lumpectomy left breast  . DIAGNOSTIC LAPAROSCOPY  2015   to rule out uterine injury after D&E  . HYSTEROSCOPY W/D&C N/A 04/19/2016   Procedure: DILATATION AND CURETTAGE /HYSTEROSCOPY;  Surgeon: Gae Dry, MD;  Location: [facility];  Service: Gynecology;  Laterality: N/A;  . INDUCED ABORTION  2015  . IUD REMOVAL N/A 04/19/2016   Procedure: INTRAUTERINE DEVICE (IUD) REMOVAL;  Surgeon: Gae Dry, MD;  Location: [facility];  Service: Gynecology;  Laterality: N/A;  . LAPAROSCOPY N/A 04/19/2016   Procedure: LAPAROSCOPY DIAGNOSTIC;  Surgeon: Gae Dry, MD;  Location: [facility];  Service: Gynecology;  Laterality: N/A;    History reviewed. No pertinent family history.  Social History   Socioeconomic History  . Marital status: Single    Spouse name: Not on file  . Number of children: Not on file  . Years of education: Not on file  . Highest education level: Not on file  Social Needs  . Financial resource strain: Not on file  . Food insecurity - worry: Not on file  . Food insecurity - inability: Not on file  . Transportation needs - medical: Not on file  . Transportation needs - non-medical: Not on file  Occupational  History  . Not on file  Tobacco Use  . Smoking status: Current Some Day Smoker    Years: 7.00    Types: Cigars  . Smokeless tobacco: Never Used  Substance and Sexual Activity  . Alcohol use: Yes    Comment: OCC  . Drug use: No  . Sexual activity: Yes  Other Topics Concern  . Not on file  Social History Narrative  . Not on file     Current Outpatient Medications:  .  Cyanocobalamin (B-12 COMPLIANCE INJECTION) 1000 MCG/ML KIT, Inject as directed., Disp: , Rfl:  .  phentermine 37.5 MG capsule, Take 37.5 mg every morning by mouth., Disp: , Rfl:    ROS:  Review of Systems  Constitutional: Negative for fever.  Gastrointestinal: Negative for blood in stool, constipation, diarrhea, nausea and vomiting.  Genitourinary: Positive for menstrual problem. Negative for dyspareunia, dysuria, flank pain, frequency, hematuria, urgency, vaginal bleeding, vaginal discharge and vaginal pain.  Musculoskeletal: Negative for back pain.  Skin: Negative for rash.     OBJECTIVE:   Vitals:  BP (!) 116/58 (BP Location: Left Arm, Patient Position: Sitting, Cuff Size: Normal)   Pulse 75   Ht 5' 4" (1.626 m)   Wt 175 lb (79.4 kg)   LMP 03/24/2017   BMI 30.04 kg/m   Physical Exam  Constitutional: She is oriented to person, place, and time  and well-developed, well-nourished, and in no distress.  Neurological: She is alert and oriented to person, place, and time.  Psychiatric: Memory, affect and judgment normal.  Vitals reviewed.   Results: Results for orders placed or performed in visit on 05/01/17 (from the past 24 hour(s))  POCT urine pregnancy     Status: Abnormal   Collection Time: 05/01/17  2:35 PM  Result Value Ref Range   Preg Test, Ur Positive (A) Negative     Assessment/Plan: Late menses - Pos UPT. D/C phentermine, wt loss injections. Start PNVs. RTO for NOB. EDD=12/28/17; [redacted]w[redacted]d - Plan: POCT urine pregnancy  Positive pregnancy test   Return in about 1 week (around 05/08/2017)  for NOB appt.  Molly B. Copland, PA-C 05/01/2017 2:47 PM

## 2017-05-01 NOTE — Patient Instructions (Signed)
I value your feedback and entrusting us with your care. If you get a [facility] patient survey, I would appreciate you taking the time to let us know about your experience today. Thank you! 

## 2017-05-09 ENCOUNTER — Emergency Department: Payer: 59

## 2017-05-09 ENCOUNTER — Emergency Department
Admission: EM | Admit: 2017-05-09 | Discharge: 2017-05-09 | Disposition: A | Discharge status: Home / Self Care | Payer: 59 | Attending: Emergency Medicine | Admitting: Emergency Medicine

## 2017-05-09 ENCOUNTER — Other Ambulatory Visit: Payer: Self-pay

## 2017-05-09 ENCOUNTER — Encounter: Payer: Self-pay | Admitting: *Deleted

## 2017-05-09 DIAGNOSIS — O200 Threatened abortion: Secondary | ICD-10-CM | POA: Diagnosis not present

## 2017-05-09 DIAGNOSIS — Z3A01 Less than 8 weeks gestation of pregnancy: Secondary | ICD-10-CM | POA: Insufficient documentation

## 2017-05-09 DIAGNOSIS — F17290 Nicotine dependence, other tobacco product, uncomplicated: Secondary | ICD-10-CM | POA: Diagnosis not present

## 2017-05-09 DIAGNOSIS — O209 Hemorrhage in early pregnancy, unspecified: Secondary | ICD-10-CM | POA: Diagnosis present

## 2017-05-09 LAB — CBC
HCT: 42.1 % (ref 35.0–47.0)
Hemoglobin: 13.9 g/dL (ref 12.0–16.0)
MCH: 27.7 pg (ref 26.0–34.0)
MCHC: 33 g/dL (ref 32.0–36.0)
MCV: 84 fL (ref 80.0–100.0)
PLATELETS: 301 10*3/uL (ref 150–440)
RBC: 5.01 MIL/uL (ref 3.80–5.20)
RDW: 14.5 % (ref 11.5–14.5)
WBC: 7.4 10*3/uL (ref 3.6–11.0)

## 2017-05-09 LAB — PREGNANCY, URINE: PREG TEST UR: POSITIVE — AB

## 2017-05-09 LAB — HCG, QUANTITATIVE, PREGNANCY: HCG, BETA CHAIN, QUANT, S: 1449 m[IU]/mL — AB (ref <5)

## 2017-05-09 LAB — ABO/RH: ABO/RH(D): O POS

## 2017-05-09 NOTE — ED Provider Notes (Signed)
[facility] Emergency Department Provider Note   ____________________________________________    I have reviewed the triage vital signs and the nursing notes.   HISTORY  Chief Complaint Vaginal Bleeding     HPI Molly Graves is a 26 y.o. female reports she is approximately [redacted] weeks pregnant who presents with vaginal bleeding.  Patient reports mild to moderate bleeding which started yesterday.  She did develop some bilateral pelvic cramping today.  No dizziness.  Patient is G4 P2.  Sees [facility] but has not seen them yet for this pregnancy.  Has not taken anything for pain   Past Medical History:  Diagnosis Date  . Anemia   . Depression    post partum depression  . Headache    MIGRAINES  . Herpes genitalia     Patient Active Problem List   Diagnosis Date Noted  . Complication of intrauterine device (IUD) ([place]) 04/19/2016    Past Surgical History:  Procedure Laterality Date  . BREAST SURGERY     lumpectomy left breast  . DIAGNOSTIC LAPAROSCOPY  2015   to rule out uterine injury after D&E  . HYSTEROSCOPY W/D&C N/A 04/19/2016   Procedure: DILATATION AND CURETTAGE /HYSTEROSCOPY;  Surgeon: Gae Dry, MD;  Location: [facility] ORS;  Service: Gynecology;  Laterality: N/A;  . INDUCED ABORTION  2015  . IUD REMOVAL N/A 04/19/2016   Procedure: INTRAUTERINE DEVICE (IUD) REMOVAL;  Surgeon: Gae Dry, MD;  Location: [facility] ORS;  Service: Gynecology;  Laterality: N/A;  . LAPAROSCOPY N/A 04/19/2016   Procedure: LAPAROSCOPY DIAGNOSTIC;  Surgeon: Gae Dry, MD;  Location: [facility] ORS;  Service: Gynecology;  Laterality: N/A;    Prior to Admission medications   Medication Sig Start Date End Date Taking? Authorizing Provider  Cyanocobalamin (B-12 COMPLIANCE INJECTION) 1000 MCG/ML KIT Inject as directed.    [provider]     Allergies Patient has no known allergies.  History reviewed. No pertinent family history.  Social  History Social History   Tobacco Use  . Smoking status: Current Some Day Smoker    Years: 7.00    Types: Cigars  . Smokeless tobacco: Never Used  Substance Use Topics  . Alcohol use: Yes    Comment: OCC  . Drug use: No    Review of Systems  Constitutional: No fever/chills Eyes: No visual changes.  ENT: No sore throat. Cardiovascular: Denies chest pain. Respiratory: Denies shortness of breath. Gastrointestinal: No abdominal pain.  No nausea, no vomiting.   Genitourinary: As above Musculoskeletal: Negative for back pain. Skin: Negative for rash. Neurological: Negative for headaches    ____________________________________________   PHYSICAL EXAM:  VITAL SIGNS: ED Triage Vitals  Enc Vitals Group     BP 05/09/17 1649 121/63     Pulse Rate 05/09/17 1649 72     Resp 05/09/17 1649 20     Temp 05/09/17 1649 99.2 F (37.3 C)     Temp Source 05/09/17 1649 Oral     SpO2 05/09/17 1649 100 %     Weight 05/09/17 1649 77.1 kg (170 lb)     Height 05/09/17 1649 1.626 m (5' 4" )     Head Circumference --      Peak Flow --      Pain Score 05/09/17 1648 5     Pain Loc --      Pain Edu? --      Excl. in [place]? --     Constitutional: Alert and oriented. No acute  distress. Pleasant and interactive Eyes: Conjunctivae are normal.  nose: No congestion/rhinnorhea. Mouth/Throat: Mucous membranes are moist.   Neck:  Painless ROM Cardiovascular: Normal rate, regular rhythm. Good peripheral circulation. Respiratory: Normal respiratory effort.  No retractions.  Gastrointestinal: Soft and nontender. No distention.  No CVA tenderness. Genitourinary: deferred Musculoskeletal:  Warm and well perfused Neurologic:  Normal speech and language. No gross focal neurologic deficits are appreciated.  Skin:  Skin is warm, dry and intact. No rash noted. Psychiatric: Mood and affect are normal. Speech and behavior are normal.  ____________________________________________   LABS (all labs ordered  are listed, but only abnormal results are displayed)  Labs Reviewed  HCG, QUANTITATIVE, PREGNANCY - Abnormal; Notable for the following components:      Result Value   hCG, Beta Chain, Quant, S 1,449 (*)    All other components within normal limits  PREGNANCY, URINE - Abnormal; Notable for the following components:   Preg Test, Ur POSITIVE (*)    All other components within normal limits  CBC  ABO/RH   ____________________________________________  EKG  None ____________________________________________  RADIOLOGY  Ultrasound shows gestational sac ____________________________________________   PROCEDURES  Procedure(s) performed: No  Procedures   Critical Care performed: No ____________________________________________   INITIAL IMPRESSION / ASSESSMENT AND PLAN / ED COURSE  Pertinent labs & imaging results that were available during my care of the patient were reviewed by me and considered in my medical decision making (see chart for details).  The patient presents with vaginal bleeding, reported [redacted] weeks pregnant.  Differential diagnosis includes threatened miscarriage, miscarriage, vaginal bleeding in first trimester.  Ultrasound shows gestational sac, beta hCG 1500, patient to follow-up with GYN this week for repeat hormone.    ____________________________________________   FINAL CLINICAL IMPRESSION(S) / ED DIAGNOSES  Final diagnoses:  Threatened miscarriage        Note:  This document was prepared using Dragon voice recognition software and may include unintentional dictation errors.    Lavonia Drafts, MD 05/09/17 2118

## 2017-05-09 NOTE — ED Triage Notes (Signed)
Pt presents w/ c/o vaginal bleeding, lower abdominal pain that is intermittent and cramping. Pt P4G2A1P2. Pt due date 12/27/17. Pt states vaginal bleeding started approximately 2000 yesterday. Pt states pain started today at 1100 today, worsening over time.

## 2017-05-09 NOTE — Discharge Instructions (Signed)
Please follow up with OB/GYN early next week for a repeat hormone level

## 2017-05-09 NOTE — ED Notes (Signed)

## 2017-05-12 ENCOUNTER — Encounter: Payer: Self-pay | Admitting: Obstetrics & Gynecology

## 2017-05-12 ENCOUNTER — Ambulatory Visit (INDEPENDENT_AMBULATORY_CARE_PROVIDER_SITE_OTHER): Payer: 59 | Admitting: Obstetrics & Gynecology

## 2017-05-12 VITALS — BP 110/70 | Wt 172.0 lb

## 2017-05-12 DIAGNOSIS — O200 Threatened abortion: Secondary | ICD-10-CM | POA: Diagnosis not present

## 2017-05-12 NOTE — Progress Notes (Signed)
Obstetric Problem Visit   Chief Complaint:  Chief Complaint  Patient presents with  . Threatened Miscarriage    History of Present Illness: Patient is a 26 y.o. [id number] 6710w0d presenting for first trimester bleeding.  The onset of bleeding was 3 days ago and moderate with some associated crampiness; min to no nausea; no breast T.  Was not on birth control, but does not desire pregnancy if this is a miscarriage moving forward.  Is bleeding equal to or greater than normal menstrual flow:  Yes Any recent trauma:  No Recent intercourse:  No History of prior miscarriage:  No Prior ultrasound demonstrating IUP:  Yes Prior ultrasound demonstrating viable IUP:  No Prior Serum HCG:  Yes- 1449 on 11/23 Rh status: O+  PMHx: She  has a past medical history of Anemia, Depression, Headache, and Herpes genitalia. Also,  has a past surgical history that includes Breast surgery; Diagnostic laparoscopy (2015); Induced abortion (2015); Hysteroscopy w/D&C (N/A, 04/19/2016); laparoscopy (N/A, 04/19/2016); and IUD removal (N/A, 04/19/2016)., family history includes Hypertension in her maternal aunt and maternal grandmother.,  reports that she has been smoking cigars.  She has smoked for the past 7.00 years. she has never used smokeless tobacco. She reports that she drinks alcohol. She reports that she does not use drugs.  She has a current medication list which includes the following prescription(s): cyanocobalamin. Also, has No Known Allergies.  Review of Systems  Constitutional: Negative for chills, fever and malaise/fatigue.  HENT: Negative for congestion, sinus pain and sore throat.   Eyes: Negative for blurred vision and pain.  Respiratory: Negative for cough and wheezing.   Cardiovascular: Negative for chest pain and leg swelling.  Gastrointestinal: Negative for abdominal pain, constipation, diarrhea, heartburn, nausea and vomiting.  Genitourinary: Negative for dysuria, frequency, hematuria and urgency.    Musculoskeletal: Negative for back pain, joint pain, myalgias and neck pain.  Skin: Negative for itching and rash.  Neurological: Negative for dizziness, tremors and weakness.  Endo/Heme/Allergies: Does not bruise/bleed easily.  Psychiatric/Behavioral: Negative for depression. The patient is not nervous/anxious and does not have insomnia.     Objective: Vitals:   05/12/17 0903  BP: 110/70   Physical Exam  Constitutional: She is oriented to person, place, and time. She appears well-developed and well-nourished. No distress.  Abdominal: Normal appearance. She exhibits no mass. There is no tenderness.  Musculoskeletal: Normal range of motion.  Neurological: She is alert and oriented to person, place, and time.  Skin: Skin is warm and dry.  Psychiatric: She has a normal mood and affect.  Vitals reviewed.   Assessment: 26 y.o. [id number] 8310w0d 1. Threatened abortion - Beta HCG, Quant   Plan: Problem List Items Addressed This Visit    None    Visit Diagnoses    Threatened abortion    -  Primary   Relevant Orders   Beta HCG, Quant     1) First trimester bleeding - incidence and clinical course of first trimester bleeding is discussed in detail with the patient today.  Approximately 1/3 of pregnancies ending in live births experienced 1st trimester bleeding.  The amount of bleeding is variable and not necessarily predictive of outcome.  Sources may be cervical or uterine.  Subchorionic hemorrhages are a frequent concurrent findings on ultrasound and are followed expectantly.  These often absorb or regress spontaneously although risk for expansion and further disruption of the utero-placental interface leading to miscarriage is possible.  There is no clearly documented benefit to limiting or modifying  activity and sexual intercourse in altering clinic course of 1st trimester bleeding.    2) If not already done will proceed with TVUS evaluation to document viability, and if uncertain  viability or absence of a demonstrable IUP (and no previous documentation of IUP) will trend HCG levels.  3) The patient is Rh O+, rhogam is therefore not indicated to decrease the risk rhesus alloimmunization.    4) Routine bleeding precautions were discussed with the patient prior the conclusion of today's visit.  5) Plans OCP if miscarriage confirmed.  Counseled when to start, side effects., risks.  Will eRx once know for sure this is miscarriage. Sample given [place].  Annamarie MajorPaul Annaliza Zia, MD, Merlinda FrederickFACOG [facility], [facility] 05/12/2017  9:21 AM

## 2017-05-13 ENCOUNTER — Other Ambulatory Visit: Payer: Self-pay | Admitting: Obstetrics & Gynecology

## 2017-05-13 LAB — BETA HCG QUANT (REF LAB): HCG QUANT: 208 m[IU]/mL

## 2017-05-13 MED ORDER — NORETHIN-ETH ESTRAD-FE BIPHAS 1 MG-10 MCG / 10 MCG PO TABS: 1.0000 | ORAL_TABLET | Freq: Every day | ORAL | 3 refills | Status: DC

## 2017-05-16 ENCOUNTER — Encounter: Payer: 59 | Admitting: Obstetrics & Gynecology

## 2017-05-29 ENCOUNTER — Telehealth: Payer: Self-pay

## 2017-05-29 NOTE — Telephone Encounter (Signed)
[facility]. Thx.

## 2017-05-29 NOTE — Telephone Encounter (Signed)
Pt has questions about her birthcontrol.  She had a miscarriage two weeks ago, started bc 1wk ago, is bleeding very heavily, has strep throat and is on med for that c bc, stomach pains. [phone]

## 2017-06-03 NOTE — Telephone Encounter (Signed)
Check on patient and see if still bleeding. Apologize for delay in response. Expected to bleedin transition after miscarriage and w new pill, so unless severe would keep taking for month and see

## 2017-06-03 NOTE — Telephone Encounter (Signed)
I'm honestly just seeing this. Patient you saw last.  Somehow this took a while to actually show up in my in box.

## 2017-06-03 NOTE — Telephone Encounter (Signed)
Pt aware and ok

## 2017-08-08 ENCOUNTER — Ambulatory Visit (INDEPENDENT_AMBULATORY_CARE_PROVIDER_SITE_OTHER): Payer: 59 | Admitting: Advanced Practice Midwife

## 2017-08-08 ENCOUNTER — Encounter: Payer: Self-pay | Admitting: Advanced Practice Midwife

## 2017-08-08 VITALS — BP 118/74 | Ht 64.0 in | Wt 172.0 lb

## 2017-08-08 DIAGNOSIS — N898 Other specified noninflammatory disorders of vagina: Secondary | ICD-10-CM | POA: Diagnosis not present

## 2017-08-08 DIAGNOSIS — N926 Irregular menstruation, unspecified: Secondary | ICD-10-CM | POA: Diagnosis not present

## 2017-08-08 LAB — POCT URINE PREGNANCY: PREG TEST UR: NEGATIVE

## 2017-08-08 NOTE — Progress Notes (Signed)
S: The patient is here today with complaint of vaginal discharge and some irritation. She had some itching that occurred a few weeks ago which is better now. She occasionally has an odor but has no complaint of that today. She tried 3 day monistat 2 weeks ago. She denies symptoms of UTI. She denies risk for STD. She is wondering if she has a yeast infection. She has also missed a period and is wondering if she is pregnant.   O: Vital Signs: BP 118/74   Ht 5\' 4"  (1.626 m)   Wt 172 lb (78 kg)   LMP 03/24/2017   BMI 29.52 kg/m  Constitutional: Well nourished, well developed female in no acute distress.  HEENT: normal Skin: Warm and dry.   Respiratory:  Normal respiratory effort Psych: Alert and Oriented x3. No memory deficits. Normal mood and affect.  MS: normal gait, normal bilateral lower extremity ROM/strength/stability.  Pelvic exam:  is not limited by body habitus EGBUS: within normal limits Vagina: within normal limits and with normal mucosa, blood in the vault, no discharge seen, swab done of vaginal mucosa Cervix: appearance normal  Wet Prep: negative for clue cells, yeast, whiff POCT pregnancy test negative  A: 27 yo female with vaginal irritation  P: Recommend against medication treatment at this time given lack of evidence for vaginitis Recommend OTC boric acid suppository for correcting vaginal pH, sea salt soak in tub for comfort, avoid tight fitting clothes, good hygiene, no douching, probiotics for beneficial bacteria Return to clinic PRN for worsening symptoms  Molly Graves, CNM

## 2017-08-11 ENCOUNTER — Ambulatory Visit: Payer: 59 | Admitting: Obstetrics & Gynecology

## 2018-03-23 ENCOUNTER — Encounter (HOSPITAL_COMMUNITY): Payer: Self-pay

## 2018-03-27 ENCOUNTER — Emergency Department: Payer: Self-pay

## 2018-03-27 ENCOUNTER — Emergency Department
Admission: EM | Admit: 2018-03-27 | Discharge: 2018-03-27 | Disposition: A | Discharge status: Home / Self Care | Payer: Self-pay | Attending: Emergency Medicine | Admitting: Emergency Medicine

## 2018-03-27 ENCOUNTER — Encounter: Payer: Self-pay | Admitting: Emergency Medicine

## 2018-03-27 DIAGNOSIS — T148XXA Other injury of unspecified body region, initial encounter: Secondary | ICD-10-CM | POA: Insufficient documentation

## 2018-03-27 DIAGNOSIS — M7918 Myalgia, other site: Secondary | ICD-10-CM | POA: Insufficient documentation

## 2018-03-27 DIAGNOSIS — T71193A Asphyxiation due to mechanical threat to breathing due to other causes, assault, initial encounter: Secondary | ICD-10-CM

## 2018-03-27 DIAGNOSIS — R51 Headache: Secondary | ICD-10-CM | POA: Insufficient documentation

## 2018-03-27 DIAGNOSIS — M5489 Other dorsalgia: Secondary | ICD-10-CM | POA: Insufficient documentation

## 2018-03-27 DIAGNOSIS — T07XXXA Unspecified multiple injuries, initial encounter: Secondary | ICD-10-CM

## 2018-03-27 DIAGNOSIS — Y9289 Other specified places as the place of occurrence of the external cause: Secondary | ICD-10-CM | POA: Insufficient documentation

## 2018-03-27 DIAGNOSIS — Y999 Unspecified external cause status: Secondary | ICD-10-CM | POA: Insufficient documentation

## 2018-03-27 DIAGNOSIS — M542 Cervicalgia: Secondary | ICD-10-CM | POA: Insufficient documentation

## 2018-03-27 DIAGNOSIS — Y9389 Activity, other specified: Secondary | ICD-10-CM | POA: Insufficient documentation

## 2018-03-27 LAB — URINALYSIS, COMPLETE (UACMP) WITH MICROSCOPIC
BACTERIA UA: NONE SEEN
Bilirubin Urine: NEGATIVE
Glucose, UA: NEGATIVE mg/dL
Hgb urine dipstick: NEGATIVE
KETONES UR: 5 mg/dL — AB
Leukocytes, UA: NEGATIVE
Nitrite: NEGATIVE
PROTEIN: NEGATIVE mg/dL
Specific Gravity, Urine: 1.026 (ref 1.005–1.030)
pH: 5 (ref 5.0–8.0)

## 2018-03-27 LAB — POCT PREGNANCY, URINE: PREG TEST UR: NEGATIVE

## 2018-03-27 MED ORDER — MELOXICAM 15 MG PO TABS: 15.0000 mg | ORAL_TABLET | Freq: Every day | ORAL | 0 refills | Status: DC
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
## 2018-03-27 NOTE — Discharge Instructions (Addendum)
° ° ° ° °Interpersonal Violence  ° °Interpersonal Violence aka Domestic Violence is defined as violence between people who have had a personal relationship. For example, someone you have ever dated, been married to or in a domestic partnership with. Someone with whom you have a child in common, or a current  household member.  °Does one or more of the following  °• attempts to cause bodily injury, or intentionally causes bodily injury; °• places you or a member of your family or household in fear of imminent serious bodily injury; °• continued harassment that rises to such a level as to inflict substantial emotional distress; or °• commits any rape or sexual offense  °You are not alone. Unfortunately domestic violence is very common. Domestic violence does not go away on its own and tends to get worse over time and more frequent. There are people who can help. There are resources included in these instructions. °Evidence can be collected in case you want to notify law enforcement now or in the future. A forensic nurse can take photographs and create a medical/legal document of the incident. If you choose to report to law enforcement, they will request a copy of the chart which we can provide with your permission. We can call in social work or an advocate to help with safety planning and emergency placement in a shelter if you have no other safe options. ° °THE POLICE CAN HELP YOU:  °• Get to a safe place away from the violence.  °• Get information on how the court can help protect you against the violence.  °• Get necessary belongings from your home for you and your children.  °• Get copies of police reports about the violence.  °• File a complaint in criminal court.  °• Find where local criminal and family courts are located. ° ° ° ° ° ° ° ° ° ° ° ° °[facility] Can Help You °• Safety Planning °• Assistance with Shelter °• Obtaining a Protective Order (50B) °• Court Advocacy and  Accompaniment °• Support Group °• Legal Assistance °• Assistance with domestic violence related criminal charges °• Child Protective Services °• Supervised Visitation °• Financial Assistance Enrollment °• Job Readiness °• Budget Counseling  °• Coaching and Mentoring ° °Call your local domestic violence program for additional information and support.  ° °[facility] [place] [facility]   336-641-SAFE °Crisis Line 336-273-7273 °[facility] [place] [facility]   336-570-6019 °Crisis Line 336-226-5985 °[facility] [place] 919-542-0475 ° °National Domestic Violence Abuse Hotline  °1-800-799-7233 ° ° ° °Soft Tissue Injury of the Neck (Strangulation) ° °A soft tissue injury of the neck is serious and needs medical care right away.  Some injuries do not break the skin (blunt injury).  Some injuries do break the skin (penetrating injury) and create an open wound.  You may feel fine at first, but the puffiness (swelling) in your throat can slowly make it harder to breathe.  This could cause serious or life-threatening injury.  There could be damage to major blood vessels and nerves in the neck.   ° °Be sure to tell your health care provider how the injury occurred and if someone else caused the injury.  Also, tell the provider if any object or hands were used to cause the injury (such as rope, clothesline, telephone cord, etc).   ° °Home Care °Get help right away if: °· Your voice gets weaker or hoarse °· Your puffiness or   bruising does not get better °· You have new puffiness or bruising in the face or neck °· Your pain gets worse  °· You have trouble swallowing °· You cough up blood °· You have trouble breathing °· You start to drool °· You start throwing up (vomiting) °· You have a fever of 102 degrees ° °Adapted from [employer] Patient Information ° ° ° °
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
## 2018-03-27 NOTE — ED Notes (Signed)
Pt given meal tray and snacks

## 2018-05-12 ENCOUNTER — Ambulatory Visit: Payer: Self-pay | Admitting: Obstetrics and Gynecology

## 2018-05-12 ENCOUNTER — Ambulatory Visit (INDEPENDENT_AMBULATORY_CARE_PROVIDER_SITE_OTHER): Payer: Self-pay

## 2018-05-12 ENCOUNTER — Encounter: Payer: Self-pay | Admitting: Obstetrics and Gynecology

## 2018-05-12 VITALS — BP 114/60 | HR 82 | Ht 63.0 in | Wt 162.0 lb

## 2018-05-12 DIAGNOSIS — N912 Amenorrhea, unspecified: Secondary | ICD-10-CM

## 2018-05-12 DIAGNOSIS — Z3201 Encounter for pregnancy test, result positive: Secondary | ICD-10-CM

## 2018-05-12 LAB — POCT URINE PREGNANCY
PREG TEST UR: POSITIVE — AB
Preg Test, Ur: POSITIVE — AB

## 2018-05-13 ENCOUNTER — Encounter: Payer: Self-pay | Admitting: Obstetrics and Gynecology

## 2018-05-13 NOTE — Patient Instructions (Signed)
I value your feedback and entrusting us with your care. If you get a [facility] patient survey, I would appreciate you taking the time to let us know about your experience today. Thank you! 

## 2018-05-13 NOTE — Progress Notes (Signed)
Pt is wk late for menses. Sched appt for vaginitis but doesn't have any sx. Pt had UPT with RN, pos result. Pt sched for NOB appt.

## 2018-05-22 ENCOUNTER — Encounter: Payer: Self-pay | Admitting: Maternal Newborn

## 2018-05-22 ENCOUNTER — Other Ambulatory Visit (HOSPITAL_COMMUNITY)
Admission: RE | Admit: 2018-05-22 | Discharge: 2018-05-22 | Disposition: A | Discharge status: Home / Self Care | Payer: Medicaid Other | Source: Ambulatory Visit | Attending: Maternal Newborn | Admitting: Maternal Newborn

## 2018-05-22 ENCOUNTER — Ambulatory Visit (INDEPENDENT_AMBULATORY_CARE_PROVIDER_SITE_OTHER): Payer: Medicaid Other | Admitting: Maternal Newborn

## 2018-05-22 VITALS — BP 106/58 | Wt 163.0 lb

## 2018-05-22 DIAGNOSIS — Z3480 Encounter for supervision of other normal pregnancy, unspecified trimester: Secondary | ICD-10-CM | POA: Insufficient documentation

## 2018-05-22 DIAGNOSIS — Z113 Encounter for screening for infections with a predominantly sexual mode of transmission: Secondary | ICD-10-CM

## 2018-05-22 DIAGNOSIS — O219 Vomiting of pregnancy, unspecified: Secondary | ICD-10-CM

## 2018-05-22 DIAGNOSIS — Z369 Encounter for antenatal screening, unspecified: Secondary | ICD-10-CM

## 2018-05-22 DIAGNOSIS — Z3A01 Less than 8 weeks gestation of pregnancy: Secondary | ICD-10-CM

## 2018-05-22 HISTORY — DX: Encounter for supervision of other normal pregnancy, unspecified trimester: Z34.80
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
## 2018-05-22 NOTE — Progress Notes (Signed)
NOB-has all day sickness

## 2018-05-25 MED ORDER — DOXYLAMINE-PYRIDOXINE 10-10 MG PO TBEC: 2.0000 | DELAYED_RELEASE_TABLET | Freq: Every day | ORAL | 5 refills | Status: DC

## 2018-05-27 LAB — CERVICOVAGINAL ANCILLARY ONLY
CHLAMYDIA, DNA PROBE: NEGATIVE
Neisseria Gonorrhea: NEGATIVE

## 2018-05-29 ENCOUNTER — Encounter: Payer: Medicaid Other | Admitting: Obstetrics and Gynecology

## 2018-05-29 ENCOUNTER — Other Ambulatory Visit: Payer: Medicaid Other

## 2018-06-05 ENCOUNTER — Encounter: Payer: Self-pay | Admitting: Emergency Medicine

## 2018-06-05 ENCOUNTER — Emergency Department
Admission: EM | Admit: 2018-06-05 | Discharge: 2018-06-05 | Disposition: A | Discharge status: Home / Self Care | Payer: Medicaid Other | Attending: Emergency Medicine | Admitting: Emergency Medicine

## 2018-06-05 ENCOUNTER — Other Ambulatory Visit: Payer: Self-pay

## 2018-06-05 DIAGNOSIS — O210 Mild hyperemesis gravidarum: Secondary | ICD-10-CM | POA: Insufficient documentation

## 2018-06-05 DIAGNOSIS — Z3A01 Less than 8 weeks gestation of pregnancy: Secondary | ICD-10-CM | POA: Insufficient documentation

## 2018-06-05 DIAGNOSIS — Z79899 Other long term (current) drug therapy: Secondary | ICD-10-CM | POA: Insufficient documentation

## 2018-06-05 DIAGNOSIS — Z87891 Personal history of nicotine dependence: Secondary | ICD-10-CM | POA: Insufficient documentation

## 2018-06-05 LAB — CBC
HEMATOCRIT: 35.7 % — AB (ref 36.0–46.0)
Hemoglobin: 11.6 g/dL — ABNORMAL LOW (ref 12.0–15.0)
MCH: 27.8 pg (ref 26.0–34.0)
MCHC: 32.5 g/dL (ref 30.0–36.0)
MCV: 85.6 fL (ref 80.0–100.0)
Platelets: 265 10*3/uL (ref 150–400)
RBC: 4.17 MIL/uL (ref 3.87–5.11)
RDW: 13.6 % (ref 11.5–15.5)
WBC: 8.2 10*3/uL (ref 4.0–10.5)
nRBC: 0 % (ref 0.0–0.2)

## 2018-06-05 LAB — COMPREHENSIVE METABOLIC PANEL
ALBUMIN: 3.4 g/dL — AB (ref 3.5–5.0)
ALT: 16 U/L (ref 0–44)
ANION GAP: 7 (ref 5–15)
AST: 19 U/L (ref 15–41)
Alkaline Phosphatase: 49 U/L (ref 38–126)
BILIRUBIN TOTAL: 0.4 mg/dL (ref 0.3–1.2)
BUN: 12 mg/dL (ref 6–20)
CO2: 22 mmol/L (ref 22–32)
Calcium: 8.8 mg/dL — ABNORMAL LOW (ref 8.9–10.3)
Chloride: 107 mmol/L (ref 98–111)
Creatinine, Ser: 0.84 mg/dL (ref 0.44–1.00)
GFR calc Af Amer: >60 mL/min (ref >60)
GFR calc non Af Amer: >60 mL/min (ref >60)
GLUCOSE: 96 mg/dL (ref 70–99)
POTASSIUM: 3.7 mmol/L (ref 3.5–5.1)
Sodium: 136 mmol/L (ref 135–145)
TOTAL PROTEIN: 6.7 g/dL (ref 6.5–8.1)

## 2018-06-05 LAB — URINALYSIS, ROUTINE W REFLEX MICROSCOPIC
Bilirubin Urine: NEGATIVE
GLUCOSE, UA: NEGATIVE mg/dL
Hgb urine dipstick: NEGATIVE
Ketones, ur: 20 mg/dL — AB
LEUKOCYTES UA: NEGATIVE
NITRITE: NEGATIVE
PROTEIN: NEGATIVE mg/dL
Specific Gravity, Urine: 1.023 (ref 1.005–1.030)
pH: 7 (ref 5.0–8.0)

## 2018-06-05 LAB — POCT PREGNANCY, URINE: PREG TEST UR: POSITIVE — AB

## 2018-06-05 MED ORDER — SODIUM CHLORIDE 0.9 % IV BOLUS
1000.0000 mL | Freq: Once | INTRAVENOUS | Status: AC
  Administered 2018-06-05: 1000 mL via INTRAVENOUS

## 2018-06-05 MED ORDER — METOCLOPRAMIDE HCL 10 MG PO TABS: 10.0000 mg | ORAL_TABLET | Freq: Four times a day (QID) | ORAL | 0 refills | Status: DC | PRN

## 2018-06-05 MED ORDER — METOCLOPRAMIDE HCL 5 MG/ML IJ SOLN
10.0000 mg | Freq: Once | INTRAMUSCULAR | Status: AC
  Administered 2018-06-05: 10 mg via INTRAVENOUS
  Filled 2018-06-05: qty 2

## 2018-06-05 NOTE — ED Triage Notes (Signed)
Pt here with c/o nausea and vomiting for about 4 weeks now, states she is [redacted] weeks pregnant and unable to keep anything down. Appears in NAD.

## 2018-06-05 NOTE — ED Provider Notes (Signed)
[facility] Emergency Department Provider Note  Time seen: 7:51 AM  I have reviewed the triage vital signs and the nursing notes.   HISTORY  Chief Complaint Emesis and Dehydration    HPI Molly Graves is a 27 y.o. female G5 approximately [redacted] weeks pregnant presents to the emergency department for nausea vomiting.  According to the patient she has a history of significant nausea vomiting with her prior pregnancies as well, for the past 3 to 4 weeks patient has been very nauseated with frequent episodes of vomiting.  States over the past few days she has not been able to keep anything down and feels weak and dehydrated.  [facility].  States she has not been prescribed any medications yet this pregnancy.  Patient denies any abdominal pain, vaginal bleeding or discharge.  Denies any diarrhea or fever.   Past Medical History:  Diagnosis Date  . Anemia   . Depression    post partum depression  . Headache    MIGRAINES  . Herpes genitalia     Patient Active Problem List   Diagnosis Date Noted  . Supervision of other normal pregnancy, antepartum 05/22/2018  . Complication of intrauterine device (IUD) (HCC) 04/19/2016    Past Surgical History:  Procedure Laterality Date  . BREAST SURGERY     lumpectomy left breast  . DIAGNOSTIC LAPAROSCOPY  2015   to rule out uterine injury after D&E  . HYSTEROSCOPY W/D&C N/A 04/19/2016   Procedure: DILATATION AND CURETTAGE /HYSTEROSCOPY;  Surgeon: Nadara Mustardobert P Harris, MD;  Location: [facility] ORS;  Service: Gynecology;  Laterality: N/A;  . INDUCED ABORTION  2015  . IUD REMOVAL N/A 04/19/2016   Procedure: INTRAUTERINE DEVICE (IUD) REMOVAL;  Surgeon: Nadara Mustardobert P Harris, MD;  Location: [facility] ORS;  Service: Gynecology;  Laterality: N/A;  . LAPAROSCOPY N/A 04/19/2016   Procedure: LAPAROSCOPY DIAGNOSTIC;  Surgeon: Nadara Mustardobert P Harris, MD;  Location: [facility] ORS;  Service: Gynecology;  Laterality: N/A;    Prior to Admission  medications   Medication Sig Start Date End Date Taking? Authorizing Provider  Doxylamine-Pyridoxine (DICLEGIS) 10-10 MG TBEC Take 2 tablets by mouth at bedtime. If symptoms persist, add one tablet in the morning and one in the afternoon 05/25/18   Oswaldo ConroySchmid, Jacelyn Y, CNM    No Known Allergies  Family History  Problem Relation Age of Onset  . Hypertension Maternal Aunt   . Hypertension Maternal Grandmother   . Cancer Other 29       Cervical, Malignant    Social History Social History   Tobacco Use  . Smoking status: Former Smoker    Years: 7.00    Types: Cigars  . Smokeless tobacco: Never Used  Substance Use Topics  . Alcohol use: Not Currently  . Drug use: No    Review of Systems Constitutional: Negative for fever. Cardiovascular: Negative for chest pain. Respiratory: Negative for shortness of breath. Gastrointestinal: Negative for abdominal pain.  Positive for nausea vomiting. Genitourinary: Negative for urinary compaints Musculoskeletal: Negative for musculoskeletal complaints Skin: Negative for skin complaints  Neurological: Negative for headache All other ROS negative  ____________________________________________   PHYSICAL EXAM:  VITAL SIGNS: ED Triage Vitals  Enc Vitals Group     BP 06/05/18 0742 120/83     Pulse Rate 06/05/18 0742 71     Resp 06/05/18 0742 18     Temp 06/05/18 0742 98.2 F (36.8 C)     Temp Source 06/05/18 0742 Oral  SpO2 06/05/18 0742 99 %     Weight 06/05/18 0739 164 lb (74.4 kg)     Height 06/05/18 0739 5\' 3"  (1.6 m)     Head Circumference --      Peak Flow --      Pain Score 06/05/18 0739 0     Pain Loc --      Pain Edu? --      Excl. in GC? --    Constitutional: Alert and oriented. Well appearing and in no distress. Eyes: Normal exam ENT   Head: Normocephalic and atraumatic.   Mouth/Throat: Mucous membranes are moist. Cardiovascular: Normal rate, regular rhythm. No murmur Respiratory: Normal respiratory effort  without tachypnea nor retractions. Breath sounds are clear Gastrointestinal: Soft and nontender. No distention.   Musculoskeletal: Nontender with normal range of motion in all extremities. Neurologic:  Normal speech and language. No gross focal neurologic deficits are appreciated. Skin:  Skin is warm, dry and intact.  Psychiatric: Mood and affect are normal. Speech and behavior are normal.   ____________________________________________   INITIAL IMPRESSION / ASSESSMENT AND PLAN / ED COURSE  Pertinent labs & imaging results that were available during my care of the patient were reviewed by me and considered in my medical decision making (see chart for details).  Patient presents emergency department for nausea vomiting approximately [redacted] weeks pregnant.  Patient states a history of significant nausea vomiting with prior pregnancies as well.  Overall the patient appears well, nontender abdomen.  Denies any vaginal bleeding or discharge.  Patient [facility] during this pregnancy.  We will check labs, IV hydrate and treat with IV Reglan.  As long as the patient's labs are reassuring we will likely discharge with Reglan and have the patient follow-up with her OB/GYN.  Patient's work-up is reassuring mild amount of ketones within the urinalysis consistent with dehydration.  Anion gap is normal.  Patient feeling better after IV fluids.  We will discharge with Reglan and OB follow-up.  Patient agreeable to plan of care.  ____________________________________________   FINAL CLINICAL IMPRESSION(S) / ED DIAGNOSES  Hyperemesis gravidarum    Minna AntisPaduchowski, Javarion Douty, MD 06/05/18 1051

## 2018-06-08 ENCOUNTER — Ambulatory Visit (INDEPENDENT_AMBULATORY_CARE_PROVIDER_SITE_OTHER): Payer: Self-pay | Admitting: Obstetrics & Gynecology

## 2018-06-08 ENCOUNTER — Ambulatory Visit (INDEPENDENT_AMBULATORY_CARE_PROVIDER_SITE_OTHER): Payer: Self-pay

## 2018-06-08 VITALS — BP 100/60 | Wt 160.0 lb

## 2018-06-08 DIAGNOSIS — Z369 Encounter for antenatal screening, unspecified: Secondary | ICD-10-CM

## 2018-06-08 DIAGNOSIS — Z3A09 9 weeks gestation of pregnancy: Secondary | ICD-10-CM

## 2018-06-08 DIAGNOSIS — O3481 Maternal care for other abnormalities of pelvic organs, first trimester: Secondary | ICD-10-CM

## 2018-06-08 DIAGNOSIS — N8311 Corpus luteum cyst of right ovary: Secondary | ICD-10-CM

## 2018-06-08 DIAGNOSIS — Z3480 Encounter for supervision of other normal pregnancy, unspecified trimester: Secondary | ICD-10-CM

## 2018-06-08 DIAGNOSIS — Z3481 Encounter for supervision of other normal pregnancy, first trimester: Secondary | ICD-10-CM

## 2018-06-08 LAB — POCT URINALYSIS DIPSTICK OB
GLUCOSE, UA: NEGATIVE
POC,PROTEIN,UA: NEGATIVE

## 2018-06-08 LAB — OB RESULTS CONSOLE VARICELLA ZOSTER ANTIBODY, IGG: Varicella: IMMUNE

## 2018-06-08 MED ORDER — PROVIDA DHA 16-16-1.25-110 MG PO CAPS: 1.0000 | ORAL_CAPSULE | Freq: Every day | ORAL | 11 refills | Status: DC

## 2018-06-08 NOTE — Patient Instructions (Signed)
First Trimester of Pregnancy  The first trimester of pregnancy is from week 1 until the end of week 13 (months 1 through 3). A week after a sperm fertilizes an egg, the egg will implant on the wall of the uterus. This embryo will begin to develop into a baby. Genes from you and your partner will form the baby. The female genes will determine whether the baby will be a boy or a girl. At 6-8 weeks, the eyes and face will be formed, and the heartbeat can be seen on ultrasound. At the end of 12 weeks, all the baby's organs will be formed.  Now that you are pregnant, you will want to do everything you can to have a healthy baby. Two of the most important things are to get good prenatal care and to follow your health care provider's instructions. Prenatal care is all the medical care you receive before the baby's birth. This care will help prevent, find, and treat any problems during the pregnancy and childbirth.  Body changes during your first trimester  Your body goes through many changes during pregnancy. The changes vary from woman to woman.   You may gain or lose a couple of pounds at first.   You may feel sick to your stomach (nauseous) and you may throw up (vomit). If the vomiting is uncontrollable, call your health care provider.   You may tire easily.   You may develop headaches that can be relieved by medicines. All medicines should be approved by your health care provider.   You may urinate more often. Painful urination may mean you have a bladder infection.   You may develop heartburn as a result of your pregnancy.   You may develop constipation because certain hormones are causing the muscles that push stool through your intestines to slow down.   You may develop hemorrhoids or swollen veins (varicose veins).   Your breasts may begin to grow larger and become tender. Your nipples may stick out more, and the tissue that surrounds them (areola) may become darker.   Your gums may bleed and may be  sensitive to brushing and flossing.   Dark spots or blotches (chloasma, mask of pregnancy) may develop on your face. This will likely fade after the baby is born.   Your menstrual periods will stop.   You may have a loss of appetite.   You may develop cravings for certain kinds of food.   You may have changes in your emotions from day to day, such as being excited to be pregnant or being concerned that something may go wrong with the pregnancy and baby.   You may have more vivid and strange dreams.   You may have changes in your hair. These can include thickening of your hair, rapid growth, and changes in texture. Some women also have hair loss during or after pregnancy, or hair that feels dry or thin. Your hair will most likely return to normal after your baby is born.  What to expect at prenatal visits  During a routine prenatal visit:   You will be weighed to make sure you and the baby are growing normally.   Your blood pressure will be taken.   Your abdomen will be measured to track your baby's growth.   The fetal heartbeat will be listened to between weeks 10 and 14 of your pregnancy.   Test results from any previous visits will be discussed.  Your health care provider may ask you:     How you are feeling.   If you are feeling the baby move.   If you have had any abnormal symptoms, such as leaking fluid, bleeding, severe headaches, or abdominal cramping.   If you are using any tobacco products, including cigarettes, chewing tobacco, and electronic cigarettes.   If you have any questions.  Other tests that may be performed during your first trimester include:   Blood tests to find your blood type and to check for the presence of any previous infections. The tests will also be used to check for low iron levels (anemia) and protein on red blood cells (Rh antibodies). Depending on your risk factors, or if you previously had diabetes during pregnancy, you may have tests to check for high blood sugar  that affects pregnant women (gestational diabetes).   Urine tests to check for infections, diabetes, or protein in the urine.   An ultrasound to confirm the proper growth and development of the baby.   Fetal screens for spinal cord problems (spina bifida) and Down syndrome.   HIV (human immunodeficiency virus) testing. Routine prenatal testing includes screening for HIV, unless you choose not to have this test.   You may need other tests to make sure you and the baby are doing well.  Follow these instructions at home:  Medicines   Follow your health care provider's instructions regarding medicine use. Specific medicines may be either safe or unsafe to take during pregnancy.   Take a prenatal vitamin that contains at least 600 micrograms (mcg) of folic acid.   If you develop constipation, try taking a stool softener if your health care provider approves.  Eating and drinking     Eat a balanced diet that includes fresh fruits and vegetables, whole grains, good sources of protein such as meat, eggs, or tofu, and low-fat dairy. Your health care provider will help you determine the amount of weight gain that is right for you.   Avoid raw meat and uncooked cheese. These carry germs that can cause birth defects in the baby.   Eating four or five small meals rather than three large meals a day may help relieve nausea and vomiting. If you start to feel nauseous, eating a few soda crackers can be helpful. Drinking liquids between meals, instead of during meals, also seems to help ease nausea and vomiting.   Limit foods that are high in fat and processed sugars, such as fried and sweet foods.   To prevent constipation:  ? Eat foods that are high in fiber, such as fresh fruits and vegetables, whole grains, and beans.  ? Drink enough fluid to keep your urine clear or pale yellow.  Activity   Exercise only as directed by your health care provider. Most women can continue their usual exercise routine during  pregnancy. Try to exercise for 30 minutes at least 5 days a week. Exercising will help you:  ? Control your weight.  ? Stay in shape.  ? Be prepared for labor and delivery.   Experiencing pain or cramping in the lower abdomen or lower back is a good sign that you should stop exercising. Check with your health care provider before continuing with normal exercises.   Try to avoid standing for long periods of time. Move your legs often if you must stand in one place for a long time.   Avoid heavy lifting.   Wear low-heeled shoes and practice good posture.   You may continue to have sex unless your health care   provider tells you not to.  Relieving pain and discomfort   Wear a good support bra to relieve breast tenderness.   Take warm sitz baths to soothe any pain or discomfort caused by hemorrhoids. Use hemorrhoid cream if your health care provider approves.   Rest with your legs elevated if you have leg cramps or low back pain.   If you develop varicose veins in your legs, wear support hose. Elevate your feet for 15 minutes, 3-4 times a day. Limit salt in your diet.  Prenatal care   Schedule your prenatal visits by the twelfth week of pregnancy. They are usually scheduled monthly at first, then more often in the last 2 months before delivery.   Write down your questions. Take them to your prenatal visits.   Keep all your prenatal visits as told by your health care provider. This is important.  Safety   Wear your seat belt at all times when driving.   Make a list of emergency phone numbers, including numbers for family, friends, the hospital, and police and fire departments.  General instructions   Ask your health care provider for a referral to a local prenatal education class. Begin classes no later than the beginning of month 6 of your pregnancy.   Ask for help if you have counseling or nutritional needs during pregnancy. Your health care provider can offer advice or refer you to specialists for help  with various needs.   Do not use hot tubs, steam rooms, or saunas.   Do not douche or use tampons or scented sanitary pads.   Do not cross your legs for long periods of time.   Avoid cat litter boxes and soil used by cats. These carry germs that can cause birth defects in the baby and possibly loss of the fetus by miscarriage or stillbirth.   Avoid all smoking, herbs, alcohol, and medicines not prescribed by your health care provider. Chemicals in these products affect the formation and growth of the baby.   Do not use any products that contain nicotine or tobacco, such as cigarettes and e-cigarettes. If you need help quitting, ask your health care provider. You may receive counseling support and other resources to help you quit.   Schedule a dentist appointment. At home, brush your teeth with a soft toothbrush and be gentle when you floss.  Contact a health care provider if:   You have dizziness.   You have mild pelvic cramps, pelvic pressure, or nagging pain in the abdominal area.   You have persistent nausea, vomiting, or diarrhea.   You have a bad smelling vaginal discharge.   You have pain when you urinate.   You notice increased swelling in your face, hands, legs, or ankles.   You are exposed to fifth disease or chickenpox.   You are exposed to German measles (rubella) and have never had it.  Get help right away if:   You have a fever.   You are leaking fluid from your vagina.   You have spotting or bleeding from your vagina.   You have severe abdominal cramping or pain.   You have rapid weight gain or loss.   You vomit blood or material that looks like coffee grounds.   You develop a severe headache.   You have shortness of breath.   You have any kind of trauma, such as from a fall or a car accident.  Summary   The first trimester of pregnancy is from week 1 until   the end of week 13 (months 1 through 3).   Your body goes through many changes during pregnancy. The changes vary from  woman to woman.   You will have routine prenatal visits. During those visits, your health care provider will examine you, discuss any test results you may have, and talk with you about how you are feeling.  This information is not intended to replace advice given to you by your health care provider. Make sure you discuss any questions you have with your health care provider.  Document Released: 05/28/2001 Document Revised: 05/15/2016 Document Reviewed: 05/15/2016  Elsevier Interactive Patient Education  2019 [employer]

## 2018-06-08 NOTE — Addendum Note (Signed)
Addended by: Cornelius MorasPATTERSON, Myles Tavella D on: 06/08/2018 04:09 PM   Modules accepted: Orders

## 2018-06-08 NOTE — Progress Notes (Signed)
  Subjective  Min nausea  Objective  BP 100/60   Wt 160 lb (72.6 kg)   LMP 04/06/2018 (Exact Date)   BMI 28.34 kg/m  General: NAD Pumonary: no increased work of breathing Abdomen: gravid, non-tender Extremities: no edema Psychiatric: mood appropriate, affect full  Assessment  27 y.o. [id number] at [id number] by  01/11/2019, by Last Menstrual Period presenting for routine prenatal visit  Plan   Problem List Items Addressed This Visit      Other   Supervision of other normal pregnancy, antepartum    Other Visit Diagnoses    [redacted] weeks gestation of pregnancy    -  Primary   Encounter for fetal ultrasound        Labs today Review of ULTRASOUND.    I have personally reviewed images and report of recent [facility] at [facility].    Plan of management to be discussed with patient.  Annamarie MajorPaul Harris, MD, Merlinda FrederickFACOG [facility], [facility] 06/08/2018  4:04 PM

## 2018-06-09 LAB — URINE DRUG PANEL 7
Amphetamines, Urine: NEGATIVE ng/mL
BARBITURATE QUANT UR: NEGATIVE ng/mL
BENZODIAZEPINE QUANT UR: NEGATIVE ng/mL
Cannabinoid Quant, Ur: NEGATIVE ng/mL
Cocaine (Metab.): NEGATIVE ng/mL
OPIATE QUANT UR: NEGATIVE ng/mL
PCP QUANT UR: NEGATIVE ng/mL

## 2018-06-10 LAB — URINE CULTURE: Organism ID, Bacteria: NO GROWTH

## 2018-06-11 LAB — HEMOGLOBINOPATHY EVALUATION
HGB C: 0 %
HGB S: 0 %
HGB VARIANT: 0 %
Hemoglobin A2 Quantitation: 2.3 % (ref 1.8–3.2)
Hemoglobin F Quantitation: 0 % (ref 0.0–2.0)
Hgb A: 97.7 % (ref 96.4–98.8)

## 2018-06-11 LAB — RPR+RH+ABO+RUB AB+AB SCR+CB...
Antibody Screen: NEGATIVE
HIV SCREEN 4TH GENERATION: NONREACTIVE
Hematocrit: 37.4 % (ref 34.0–46.6)
Hemoglobin: 12.8 g/dL (ref 11.1–15.9)
Hepatitis B Surface Ag: NEGATIVE
MCH: 28.3 pg (ref 26.6–33.0)
MCHC: 34.2 g/dL (ref 31.5–35.7)
MCV: 83 fL (ref 79–97)
PLATELETS: 309 10*3/uL (ref 150–450)
RBC: 4.53 x10E6/uL (ref 3.77–5.28)
RDW: 13.8 % (ref 12.3–15.4)
RPR: NONREACTIVE
RUBELLA: 1.86 {index} (ref >0.99)
Rh Factor: POSITIVE
VARICELLA: 700 {index} (ref >165)
WBC: 8.2 10*3/uL (ref 3.4–10.8)

## 2018-06-17 NOTE — L&D Delivery Note (Signed)
Obstetrical Delivery Note   Date of Delivery:   12/25/2018 Primary OB:   [facility] Gestational Age/EDD: [redacted]w[redacted]d (Dated by LMP) Antepartum complications: anemia and hx of HSV, pyelo, depression  Delivered By:   Dalia Heading, CNM  Delivery Type:   spontaneous vaginal delivery  Procedure Details:   Called to see mother for rectal pressure. Cervix C/C/+2. Ambra was transported on bed to LDR. She pushed to deliver a vigorous female infant in [place]. Baby dried and placed on mother's abdomen. After a delayed cord clamping  (over 1 minute), the cord was clamped and the FOB cut the cord. Baby placed skin to skin with FOB while placenta was delivered. Spontaneous delivery of intact placenta and 3 vessel cord. Cord insertion was on periphery of placenta, but not velamentous. No perineal or vaginal lacerations seen. Unable to visualize cervix. EBL 350.   Anesthesia:    none Intrapartum complications: Meconium stained amniotic fluid, rapid progress in active labor GBS:    Positive-not adequately treated Laceration:    none Episiotomy:    none Placenta:    Via active 3rd stage. To pathology: no Estimated Blood Loss:  350 ml Baby:    Liveborn female, Apgars 8/9, weight pending    Dalia Heading, CNM

## 2018-07-06 ENCOUNTER — Ambulatory Visit (INDEPENDENT_AMBULATORY_CARE_PROVIDER_SITE_OTHER): Payer: Medicaid Other | Admitting: Obstetrics & Gynecology

## 2018-07-06 VITALS — BP 110/70 | Wt 163.0 lb

## 2018-07-06 DIAGNOSIS — Z3480 Encounter for supervision of other normal pregnancy, unspecified trimester: Secondary | ICD-10-CM

## 2018-07-06 DIAGNOSIS — Z3481 Encounter for supervision of other normal pregnancy, first trimester: Secondary | ICD-10-CM

## 2018-07-06 DIAGNOSIS — Z3A13 13 weeks gestation of pregnancy: Secondary | ICD-10-CM

## 2018-07-06 LAB — POCT URINALYSIS DIPSTICK OB
Glucose, UA: NEGATIVE
PROTEIN: NEGATIVE

## 2018-07-06 MED ORDER — DOXYLAMINE-PYRIDOXINE 10-10 MG PO TBEC: 2.0000 | DELAYED_RELEASE_TABLET | Freq: Every day | ORAL | 5 refills | Status: DC

## 2018-07-06 NOTE — Addendum Note (Signed)
Addended by: Cornelius Moras D on: 07/06/2018 04:51 PM   Modules accepted: Orders

## 2018-07-06 NOTE — Patient Instructions (Signed)

## 2018-07-06 NOTE — Progress Notes (Signed)
  Subjective  Pt still has nausea.  Reglan helps but has almost-daily headaches with it. Diclegis in past pregnancy w success. No pain or bleeding  Objective  BP 110/70   Wt 163 lb (73.9 kg)   LMP 04/06/2018 (Exact Date)   BMI 28.87 kg/m  General: NAD Pumonary: no increased work of breathing Abdomen: gravid, non-tender Extremities: no edema Psychiatric: mood appropriate, affect full  Assessment  28 y.o. [id number] at [redacted]w[redacted]d by  01/11/2019, by Last Menstrual Period presenting for routine prenatal visit  Plan   Problem List Items Addressed This Visit      Other   Supervision of other normal pregnancy, antepartum    Other Visit Diagnoses    [redacted] weeks gestation of pregnancy    -  Primary    Diclegis, stop Reglan Contraception discussed, declines BTL and IUD.  Unsure otherwise.  Annamarie Major, MD, [facility], [facility] 07/06/2018  4:46 PM

## 2018-08-05 ENCOUNTER — Ambulatory Visit (INDEPENDENT_AMBULATORY_CARE_PROVIDER_SITE_OTHER): Payer: Medicaid Other | Admitting: Obstetrics & Gynecology

## 2018-08-05 VITALS — BP 102/60 | Wt 168.0 lb

## 2018-08-05 DIAGNOSIS — Z3480 Encounter for supervision of other normal pregnancy, unspecified trimester: Secondary | ICD-10-CM

## 2018-08-05 DIAGNOSIS — Z3482 Encounter for supervision of other normal pregnancy, second trimester: Secondary | ICD-10-CM

## 2018-08-05 DIAGNOSIS — Z3A17 17 weeks gestation of pregnancy: Secondary | ICD-10-CM

## 2018-08-05 NOTE — Progress Notes (Signed)
  Subjective  Fetal Movement? yes Contractions? no Leaking Fluid? no Vaginal Bleeding? no Min nausea Some low back pain Objective  BP 102/60   Wt 168 lb (76.2 kg)   LMP 04/06/2018 (Exact Date)   BMI 29.76 kg/m  General: NAD Pumonary: no increased work of breathing Abdomen: gravid, non-tender Extremities: no edema Psychiatric: mood appropriate, affect full  Assessment  28 y.o. [id number] at [redacted]w[redacted]d by  01/11/2019, by Last Menstrual Period presenting for routine prenatal visit  Plan   Problem List Items Addressed This Visit      Other   Supervision of other normal pregnancy, antepartum   Relevant Orders   US OB Comp + 14 Wk    Other Visit Diagnoses    [redacted] weeks gestation of pregnancy    -  Primary   Relevant Orders   US OB Comp + 14 Wk     Clinic [facility] Prenatal Labs  Dating [place] Blood type: O/Positive/-- (12/23 1605)   Genetic Screen declines Antibody:Negative (12/23 1605)  Anatomic [place]  Rubella: 1.86 (12/23 1605) Varicella:    GTT Third trimester:  RPR: Non Reactive (12/23 1605)   Rhogam O+ HBsAg: Negative (12/23 1605)   TDaP vaccine              Flu Shot: HIV: Non Reactive (12/23 1605)   Baby Food                                GBS: pending  Contraception Declines BTL, IUD.  Undecided VOZ:[id number] PP    Annamarie Major, MD, [facility], [facility] 08/05/2018  4:46 PM

## 2018-08-07 ENCOUNTER — Other Ambulatory Visit: Payer: Self-pay

## 2018-08-07 ENCOUNTER — Encounter: Payer: Self-pay | Admitting: Emergency Medicine

## 2018-08-07 ENCOUNTER — Emergency Department: Payer: Medicaid Other

## 2018-08-07 ENCOUNTER — Inpatient Hospital Stay
Admission: EM | Admit: 2018-08-07 | Discharge: 2018-08-11 | DRG: 831 | Disposition: A | Discharge status: Home / Self Care | Payer: Medicaid Other | Attending: Certified Nurse Midwife | Admitting: Certified Nurse Midwife

## 2018-08-07 DIAGNOSIS — O2302 Infections of kidney in pregnancy, second trimester: Principal | ICD-10-CM | POA: Diagnosis present

## 2018-08-07 DIAGNOSIS — N1330 Unspecified hydronephrosis: Secondary | ICD-10-CM | POA: Diagnosis present

## 2018-08-07 DIAGNOSIS — E878 Other disorders of electrolyte and fluid balance, not elsewhere classified: Secondary | ICD-10-CM | POA: Diagnosis present

## 2018-08-07 DIAGNOSIS — A4151 Sepsis due to Escherichia coli [E. coli]: Secondary | ICD-10-CM

## 2018-08-07 DIAGNOSIS — R1031 Right lower quadrant pain: Secondary | ICD-10-CM

## 2018-08-07 DIAGNOSIS — Z87891 Personal history of nicotine dependence: Secondary | ICD-10-CM

## 2018-08-07 DIAGNOSIS — O9989 Other specified diseases and conditions complicating pregnancy, childbirth and the puerperium: Secondary | ICD-10-CM | POA: Diagnosis present

## 2018-08-07 DIAGNOSIS — Z3A17 17 weeks gestation of pregnancy: Secondary | ICD-10-CM

## 2018-08-07 LAB — URINALYSIS, COMPLETE (UACMP) WITH MICROSCOPIC
Bilirubin Urine: NEGATIVE
GLUCOSE, UA: NEGATIVE mg/dL
Hgb urine dipstick: NEGATIVE
KETONES UR: 20 mg/dL — AB
NITRITE: NEGATIVE
PROTEIN: 100 mg/dL — AB
Specific Gravity, Urine: 1.015 (ref 1.005–1.030)
pH: 6 (ref 5.0–8.0)

## 2018-08-07 LAB — COMPREHENSIVE METABOLIC PANEL
ALT: 15 U/L (ref 0–44)
AST: 18 U/L (ref 15–41)
Albumin: 2.8 g/dL — ABNORMAL LOW (ref 3.5–5.0)
Alkaline Phosphatase: 59 U/L (ref 38–126)
Anion gap: 7 (ref 5–15)
BUN: 6 mg/dL (ref 6–20)
CHLORIDE: 105 mmol/L (ref 98–111)
CO2: 19 mmol/L — ABNORMAL LOW (ref 22–32)
Calcium: 8.2 mg/dL — ABNORMAL LOW (ref 8.9–10.3)
Creatinine, Ser: 0.6 mg/dL (ref 0.44–1.00)
GFR calc non Af Amer: >60 mL/min (ref >60)
Glucose, Bld: 107 mg/dL — ABNORMAL HIGH (ref 70–99)
Potassium: 3.1 mmol/L — ABNORMAL LOW (ref 3.5–5.1)
Sodium: 131 mmol/L — ABNORMAL LOW (ref 135–145)
Total Bilirubin: 0.6 mg/dL (ref 0.3–1.2)
Total Protein: 6.3 g/dL — ABNORMAL LOW (ref 6.5–8.1)

## 2018-08-07 LAB — LACTIC ACID, PLASMA: LACTIC ACID, VENOUS: 1 mmol/L (ref 0.5–1.9)

## 2018-08-07 LAB — CBC WITH DIFFERENTIAL/PLATELET
ABS IMMATURE GRANULOCYTES: 0.07 10*3/uL (ref 0.00–0.07)
BASOS ABS: 0 10*3/uL (ref 0.0–0.1)
Basophils Relative: 0 %
Eosinophils Absolute: 0 10*3/uL (ref 0.0–0.5)
Eosinophils Relative: 0 %
HCT: 31.7 % — ABNORMAL LOW (ref 36.0–46.0)
Hemoglobin: 10.4 g/dL — ABNORMAL LOW (ref 12.0–15.0)
Immature Granulocytes: 1 %
Lymphocytes Relative: 5 %
Lymphs Abs: 0.5 10*3/uL — ABNORMAL LOW (ref 0.7–4.0)
MCH: 28 pg (ref 26.0–34.0)
MCHC: 32.8 g/dL (ref 30.0–36.0)
MCV: 85.2 fL (ref 80.0–100.0)
Monocytes Absolute: 0.9 10*3/uL (ref 0.1–1.0)
Monocytes Relative: 8 %
NEUTROS PCT: 86 %
NRBC: 0 % (ref 0.0–0.2)
Neutro Abs: 9.5 10*3/uL — ABNORMAL HIGH (ref 1.7–7.7)
Platelets: 199 10*3/uL (ref 150–400)
RBC: 3.72 MIL/uL — ABNORMAL LOW (ref 3.87–5.11)
RDW: 13.3 % (ref 11.5–15.5)
WBC: 11.1 10*3/uL — ABNORMAL HIGH (ref 4.0–10.5)

## 2018-08-07 LAB — WET PREP, GENITAL
Clue Cells Wet Prep HPF POC: NONE SEEN
Sperm: NONE SEEN
Trich, Wet Prep: NONE SEEN
Yeast Wet Prep HPF POC: NONE SEEN

## 2018-08-07 LAB — HCG, QUANTITATIVE, PREGNANCY: hCG, Beta Chain, Quant, S: 22075 m[IU]/mL — ABNORMAL HIGH (ref <5)

## 2018-08-07 LAB — CHLAMYDIA/NGC RT PCR (ARMC ONLY)
Chlamydia Tr: NOT DETECTED
N gonorrhoeae: NOT DETECTED

## 2018-08-07 LAB — INFLUENZA PANEL BY PCR (TYPE A & B)
Influenza A By PCR: NEGATIVE
Influenza B By PCR: NEGATIVE

## 2018-08-07 MED ORDER — SODIUM CHLORIDE 0.9 % IV BOLUS
1000.0000 mL | Freq: Once | INTRAVENOUS | Status: AC
  Administered 2018-08-07: 1000 mL via INTRAVENOUS

## 2018-08-07 MED ORDER — ACETAMINOPHEN 500 MG PO TABS
500.0000 mg | ORAL_TABLET | Freq: Once | ORAL | Status: AC
  Administered 2018-08-07: 500 mg via ORAL
  Filled 2018-08-07: qty 1

## 2018-08-07 MED ORDER — ACETAMINOPHEN 325 MG PO TABS
650.0000 mg | ORAL_TABLET | Freq: Once | ORAL | Status: AC
  Administered 2018-08-07: 650 mg via ORAL
  Filled 2018-08-07: qty 2

## 2018-08-07 MED ORDER — SODIUM CHLORIDE 0.9 % IV SOLN
1.0000 g | Freq: Once | INTRAVENOUS | Status: AC
  Administered 2018-08-07: 1 g via INTRAVENOUS
  Filled 2018-08-07: qty 10

## 2018-08-07 NOTE — ED Provider Notes (Signed)
[facility] Emergency Department Provider Note  ____________________________________________  Time seen: Approximately 4:56 PM  I have reviewed the triage vital signs and the nursing notes.   HISTORY  Chief Complaint Generalized Body Aches    HPI CAHTERINE CONNEELY is a 28 y.o. female who presents the emergency department complaining of sudden onset of fevers, body aches, chills, sweating, mild diarrhea, increased urination.  Patient reports that symptoms began rapidly yesterday.  She has had no nasal congestion, sore throat, coughing.  Patient is [redacted] weeks pregnant.  She denies any abdominal pain, vaginal bleeding or discharge.  No medications prior to arrival.  No other complaints at this time.    Past Medical History:  Diagnosis Date  . Anemia   . Depression    post partum depression  . Headache    MIGRAINES  . Herpes genitalia     Patient Active Problem List   Diagnosis Date Noted  . Supervision of other normal pregnancy, antepartum 05/22/2018  . Complication of intrauterine device (IUD) (HCC) 04/19/2016    Past Surgical History:  Procedure Laterality Date  . BREAST SURGERY     lumpectomy left breast  . DIAGNOSTIC LAPAROSCOPY  2015   to rule out uterine injury after D&E  . HYSTEROSCOPY W/D&C N/A 04/19/2016   Procedure: DILATATION AND CURETTAGE /HYSTEROSCOPY;  Surgeon: Nadara Mustard, MD;  Location: [facility] ORS;  Service: Gynecology;  Laterality: N/A;  . INDUCED ABORTION  2015  . IUD REMOVAL N/A 04/19/2016   Procedure: INTRAUTERINE DEVICE (IUD) REMOVAL;  Surgeon: Nadara Mustard, MD;  Location: [facility] ORS;  Service: Gynecology;  Laterality: N/A;  . LAPAROSCOPY N/A 04/19/2016   Procedure: LAPAROSCOPY DIAGNOSTIC;  Surgeon: Nadara Mustard, MD;  Location: [facility] ORS;  Service: Gynecology;  Laterality: N/A;    Prior to Admission medications   Medication Sig Start Date End Date Taking? Authorizing Provider  Doxylamine-Pyridoxine (DICLEGIS) 10-10 MG TBEC Take  2 tablets by mouth at bedtime. If symptoms persist, add one tablet in the morning and one in the afternoon 07/06/18   Nadara Mustard, MD  metoCLOPramide (REGLAN) 10 MG tablet Take 1 tablet (10 mg total) by mouth every 6 (six) hours as needed for nausea. 06/05/18   Minna Antis, MD  Prenat-FeFum-FePo-FA-DHA w/o A (PROVIDA DHA) 16-16-1.25-110 MG CAPS Take 1 capsule by mouth daily. 06/08/18   Nadara Mustard, MD    Allergies Patient has no known allergies.  Family History  Problem Relation Age of Onset  . Hypertension Maternal Aunt   . Hypertension Maternal Grandmother   . Cancer Other 29       Cervical, Malignant    Social History Social History   Tobacco Use  . Smoking status: Former Smoker    Years: 7.00    Types: Cigars  . Smokeless tobacco: Never Used  Substance Use Topics  . Alcohol use: Not Currently  . Drug use: No     Review of Systems  Constitutional: No fever/chills Eyes: No visual changes. No discharge ENT: No upper respiratory complaints. Cardiovascular: no chest pain. Respiratory: no cough. No SOB. Gastrointestinal: No abdominal pain.  No nausea, no vomiting.  No diarrhea.  No constipation. Genitourinary: Mild dysuria. No hematuria.  Positive for increased urinary frequency Musculoskeletal: Negative for musculoskeletal pain. Skin: Negative for rash, abrasions, lacerations, ecchymosis. Neurological: Negative for headaches, focal weakness or numbness. 10-point ROS otherwise negative.  ____________________________________________   PHYSICAL EXAM:  VITAL SIGNS: ED Triage Vitals  Enc Vitals Group  BP 08/07/18 1557 114/63     Pulse --      Resp 08/07/18 1557 16     Temp 08/07/18 1557 99.2 F (37.3 C)     Temp Source 08/07/18 1557 Oral     SpO2 08/07/18 1557 100 %     Weight 08/07/18 1558 167 lb 8.8 oz (76 kg)     Height 08/07/18 1558 5\' 4"  (1.626 m)     Head Circumference --      Peak Flow --      Pain Score 08/07/18 1558 8     Pain Loc  --      Pain Edu? --      Excl. in GC? --      Constitutional: Alert and oriented.  Moderately ill appearing but in no acute distress. Eyes: Conjunctivae are normal. PERRL. EOMI. Head: Atraumatic. ENT:      Ears: EACs and TMs unremarkable bilaterally.      Nose: No congestion/rhinnorhea.      Mouth/Throat: Mucous membranes are moist.  Oropharynx is nonerythematous and nonedematous. Neck: No stridor.  Hematological/Lymphatic/Immunilogical: Scattered anterior cervical lymphadenopathy. Cardiovascular: Normal rate, regular rhythm. Normal S1 and S2.  Good peripheral circulation. Respiratory: Normal respiratory effort without tachypnea or retractions. Lungs CTAB. Good air entry to the bases with no decreased or absent breath sounds. Gastrointestinal: Visibly gravid abdomen.  Fundus appreciated just below the level of umbilicus.  Bowel sounds 4 quadrants. Soft and nontender to palpation. No guarding or rigidity. No palpable masses. No distention. No CVA tenderness.  On reevaluation after patient began complaining of right lower quadrant pain, patient does have tenderness to palpation in the right lower quadrant over McBurney's point.  No rebound tenderness.  No Rovsing's.  No obturators. Genitourinary: Visualization of the external genitalia reveals no lesions or chancres.  Pelvic exam with speculum reveals mild white discharge in the vaginal vault.  Visualization of the cervix reveals no erythema.  Cervix remains closed.  No effacement.  No intrvaginal lesions or lacerations.  Bimanual exam reveals no cervical motion tenderness.  Other than palpable findings consistent with pregnancy, no other significant palpable findings on bimanual exam. Musculoskeletal: Full range of motion to all extremities. No gross deformities appreciated. Neurologic:  Normal speech and language. No gross focal neurologic deficits are appreciated.  Skin:  Skin is warm, dry and intact. No rash noted. Psychiatric: Mood and  affect are normal. Speech and behavior are normal. Patient exhibits appropriate insight and judgement.   ____________________________________________   LABS (all labs ordered are listed, but only abnormal results are displayed)  Labs Reviewed  WET PREP, GENITAL - Abnormal; Notable for the following components:      Result Value   WBC, Wet Prep HPF POC RARE (*)    All other components within normal limits  URINALYSIS, COMPLETE (UACMP) WITH MICROSCOPIC - Abnormal; Notable for the following components:   Color, Urine YELLOW (*)    APPearance CLOUDY (*)    Ketones, ur 20 (*)    Protein, ur 100 (*)    Leukocytes,Ua LARGE (*)    WBC, UA >50 (*)    Bacteria, UA RARE (*)    All other components within normal limits  COMPREHENSIVE METABOLIC PANEL - Abnormal; Notable for the following components:   Sodium 131 (*)    Potassium 3.1 (*)    CO2 19 (*)    Glucose, Bld 107 (*)    Calcium 8.2 (*)    Total Protein 6.3 (*)    Albumin  2.8 (*)    All other components within normal limits  CBC WITH DIFFERENTIAL/PLATELET - Abnormal; Notable for the following components:   WBC 11.1 (*)    RBC 3.72 (*)    Hemoglobin 10.4 (*)    HCT 31.7 (*)    Neutro Abs 9.5 (*)    Lymphs Abs 0.5 (*)    All other components within normal limits  HCG, QUANTITATIVE, PREGNANCY - Abnormal; Notable for the following components:   hCG, Beta Chain, Quant, S 22,075 (*)    All other components within normal limits  CHLAMYDIA/NGC RT PCR ([facility] ONLY)  URINE CULTURE  CULTURE, BLOOD (ROUTINE X 2)  CULTURE, BLOOD (ROUTINE X 2)  INFLUENZA PANEL BY PCR (TYPE A & B)  LACTIC ACID, PLASMA   ____________________________________________  EKG   ____________________________________________  RADIOLOGY   US Ob Limited  Result Date: 08/07/2018 CLINICAL DATA:  Right lower quadrant pain. EXAM: LIMITED OBSTETRIC ULTRASOUND FINDINGS: Number of Fetuses: 1 Heart Rate:  165 bpm Movement: Visualized. Presentation:  Cephalic Placental Location: Posterior Previa: Marginal Amniotic Fluid (Subjective):  Within normal limits. BPD: 3.9 cm 17 w  6 d MATERNAL FINDINGS: Cervix:  Appears closed. Uterus/Adnexae: No abnormality visualized. IMPRESSION: Single living intrauterine gestation at estimated 17 week 6 day gestational age by BPD. Marginal placenta previa without acute findings. This exam is performed on an emergent basis and does not comprehensively evaluate fetal size, dating, or anatomy; follow-up complete OB US should be considered if further fetal assessment is warranted. Electronically Signed   By: Kennith Center M.D.   On: 08/07/2018 21:12    ____________________________________________    PROCEDURES  Procedure(s) performed:    Procedures    Medications  sodium chloride 0.9 % bolus 1,000 mL (0 mLs Intravenous Stopped 08/07/18 2142)  acetaminophen (TYLENOL) tablet 650 mg (650 mg Oral Given 08/07/18 2004)  acetaminophen (TYLENOL) tablet 500 mg (500 mg Oral Given 08/07/18 2149)  sodium chloride 0.9 % bolus 1,000 mL (1,000 mLs Intravenous New Bag/Given 08/07/18 2149)  cefTRIAXone (ROCEPHIN) 1 g in sodium chloride 0.9 % 100 mL IVPB (0 g Intravenous Stopped 08/07/18 2307)     ____________________________________________   INITIAL IMPRESSION / ASSESSMENT AND PLAN / ED COURSE  Pertinent labs & imaging results that were available during my care of the patient were reviewed by me and considered in my medical decision making (see chart for details).  Review of the [place] CSRS was performed in accordance of the [facility] prior to dispensing any controlled drugs.  Clinical Course as of Aug 07 2349  Fri Aug 07, 2018  1708 Patient presents emergency department once on set of flulike symptoms.  Patient does also have some increased urinary frequency as well as mild dysuria.  Patient is [redacted] weeks pregnant.  As such, patient will be tested for influenza as well as a urinalysis performed.  Patient denies any pregnancy  complications at this point.  No indication for pelvic exam or ultrasound.   [JC]  1914 After urinalysis returns with leukocytosis, rare bacteria, I went to discuss the findings with the patient.  Patient reports also experiencing right lower quadrant abdominal pain at this time.  Patient's fever is increasing even with Tylenol use.  She is tachycardic at a rate of 130 bpm.  Patient's blood pressure has decreased slightly from 114/ 63 to 108/54.  At this time, patient will be given an IV, IV fluids, lab work and ultrasound ordered to further evaluate right lower quadrant pain.  Differential at this time  includes urinary tract infection, pyelonephritis, ovarian cyst, ovarian torsion, appendicitis.   [JC]  2142 Ultrasound was limited to OB, did not evaluate other structures in the right lower quadrant.  Right lower quadrant ultrasound as ordered for evaluation specifically the appendix.  At this time, patient is given another liter of fluid, Rocephin is initiated for UTI type findings.  I discussed the case with attending provider, Dr. Sharma CovertNorman who advises to await further ultrasound results, to perform a pelvic exam in addition.    [JC]  2301 Ultrasound advises that they are unable to perform ultrasound for appendicitis.  At this time, it is recommended that patient has an MRI for appendicitis.  This section of the emergency department is closing and as such the patient will be transferred to the major side emergency department pending her MRI.   [JC]    Clinical Course User Index [JC] Jyrah Blye, Delorise RoyalsJonathan D, PA-C     Patient presented to the emergency department complaining of flulike symptoms.  Initially, symptoms included body aches, malaise, increased urinary symptoms.  On reevaluation, patient's temperature had risen, patient was tachycardic and complaining of right lower quadrant pain.  Patient's pain has continued.  She is [redacted] weeks pregnant but denies any vaginal bleeding or discharge.  She denied  any pregnancy complications at this time..  Fetal heart rate was reassuring.  Exam was overall reassuring with the exception of a positive finding of right lower quadrant tenderness to palpation.  No rebound tenderness.  No Rovsing's or obturators.  However, patient was evaluated with ultrasound, additional labs.  Patient does have leukocytosis as well as mild hyponatremia, mild hypokalemia, mild hypocalcemia, low protein and albumin.Marland Kitchen.  Urinalysis is concerning for UTI.  On exam, patient does not have any CVA tenderness.  With symptoms of fever, mild dysuria, polyuria, patient may have mild pyelonephritis though other intra-abdominal pathology is more likely.  Patient's symptoms continue to wax and wane but she continues to endorse right lower quadrant pain.  With fever, tachycardia, right lower quadrant pain, anorexia, patient required evaluation for appendicitis.  Ultrasound of fetus is reassuring.  They were unable to assess appendix on ultrasound and MRI is ordered.  This is pending at this time.  Pelvic exam reveals some mild vaginal discharge but wet prep reveals no clue cells, yeast, or trichomoniasis.  There was no cervical motion tenderness concerning for PID.  Patient has been treated with 2 L of normal saline, Rocephin IV.  At this point, the section emergency department is closing, prior to MRI assessment.  Patient will be transferred to the major side of the emergency department and Dr. Sharma CovertNorman assumes care.  Final diagnosis and disposition will be provided after MRI.     ____________________________________________  FINAL CLINICAL IMPRESSION(S) / ED DIAGNOSES  Final diagnoses:  RLQ abdominal pain      NEW MEDICATIONS STARTED DURING THIS VISIT:  ED Discharge Orders    None          This chart was dictated using voice recognition software/Dragon. Despite best efforts to proofread, errors can occur which can change the meaning. Any change was purely unintentional.    Racheal PatchesCuthriell,  Magon Croson D, PA-C 08/07/18 2352    Rockne MenghiniNorman, Anne-Caroline, MD 08/10/18 [phone]

## 2018-08-07 NOTE — ED Notes (Addendum)
Pt transported to MRI 

## 2018-08-07 NOTE — ED Notes (Signed)
FHR 160 BPM

## 2018-08-07 NOTE — ED Notes (Signed)
MRI tech at bedside.

## 2018-08-07 NOTE — ED Notes (Addendum)
See triage note  Presents with body aches and fever  sxs' started last pm  Low grade fever on arrival

## 2018-08-07 NOTE — ED Triage Notes (Signed)
Patient reports body aches and fever since last night. States she is [redacted] weeks pregnant with no complications.

## 2018-08-08 ENCOUNTER — Encounter: Payer: Self-pay | Admitting: Certified Nurse Midwife

## 2018-08-08 ENCOUNTER — Other Ambulatory Visit: Payer: Self-pay | Admitting: Certified Nurse Midwife

## 2018-08-08 DIAGNOSIS — O2302 Infections of kidney in pregnancy, second trimester: Secondary | ICD-10-CM | POA: Diagnosis not present

## 2018-08-08 DIAGNOSIS — O9989 Other specified diseases and conditions complicating pregnancy, childbirth and the puerperium: Secondary | ICD-10-CM | POA: Diagnosis present

## 2018-08-08 DIAGNOSIS — E878 Other disorders of electrolyte and fluid balance, not elsewhere classified: Secondary | ICD-10-CM | POA: Diagnosis present

## 2018-08-08 DIAGNOSIS — Z3A17 17 weeks gestation of pregnancy: Secondary | ICD-10-CM | POA: Diagnosis not present

## 2018-08-08 DIAGNOSIS — N1330 Unspecified hydronephrosis: Secondary | ICD-10-CM | POA: Diagnosis present

## 2018-08-08 DIAGNOSIS — A4151 Sepsis due to Escherichia coli [E. coli]: Secondary | ICD-10-CM | POA: Diagnosis present

## 2018-08-08 DIAGNOSIS — R1031 Right lower quadrant pain: Secondary | ICD-10-CM | POA: Diagnosis present

## 2018-08-08 DIAGNOSIS — Z87891 Personal history of nicotine dependence: Secondary | ICD-10-CM | POA: Diagnosis not present

## 2018-08-08 HISTORY — DX: Infections of kidney in pregnancy, second trimester: O23.02

## 2018-08-08 LAB — TYPE AND SCREEN
ABO/RH(D): O POS
Antibody Screen: NEGATIVE

## 2018-08-08 LAB — BLOOD CULTURE ID PANEL (REFLEXED)

## 2018-08-08 LAB — CBC
HCT: 27.4 % — ABNORMAL LOW (ref 36.0–46.0)
HCT: 26.7 % — ABNORMAL LOW (ref 36.0–46.0)
Hemoglobin: 8.9 g/dL — ABNORMAL LOW (ref 12.0–15.0)
Hemoglobin: 8.6 g/dL — ABNORMAL LOW (ref 12.0–15.0)
MCH: 27.9 pg (ref 26.0–34.0)
MCH: 27.8 pg (ref 26.0–34.0)
MCHC: 32.5 g/dL (ref 30.0–36.0)
MCHC: 32.2 g/dL (ref 30.0–36.0)
MCV: 85.9 fL (ref 80.0–100.0)
MCV: 86.4 fL (ref 80.0–100.0)
Platelets: 178 10*3/uL (ref 150–400)
Platelets: 172 10*3/uL (ref 150–400)
RBC: 3.19 MIL/uL — ABNORMAL LOW (ref 3.87–5.11)
RBC: 3.09 MIL/uL — ABNORMAL LOW (ref 3.87–5.11)
RDW: 13.5 % (ref 11.5–15.5)
RDW: 13.7 % (ref 11.5–15.5)
WBC: 14.5 10*3/uL — AB (ref 4.0–10.5)
WBC: 13.8 10*3/uL — ABNORMAL HIGH (ref 4.0–10.5)
nRBC: 0 % (ref 0.0–0.2)
nRBC: 0 % (ref 0.0–0.2)

## 2018-08-08 LAB — ELECTROLYTE PANEL
Anion gap: 4 — ABNORMAL LOW (ref 5–15)
CO2: 18 mmol/L — ABNORMAL LOW (ref 22–32)
Chloride: 110 mmol/L (ref 98–111)
Potassium: 3.6 mmol/L (ref 3.5–5.1)
Sodium: 132 mmol/L — ABNORMAL LOW (ref 135–145)

## 2018-08-08 LAB — LACTIC ACID, PLASMA
LACTIC ACID, VENOUS: 1.3 mmol/L (ref 0.5–1.9)
Lactic Acid, Venous: 0.9 mmol/L (ref 0.5–1.9)

## 2018-08-08 MED ORDER — CALCIUM CARBONATE ANTACID 500 MG PO CHEW
2.0000 | CHEWABLE_TABLET | ORAL | Status: DC | PRN
  Administered 2018-08-09: 400 mg via ORAL
  Filled 2018-08-08: qty 2

## 2018-08-08 MED ORDER — METOCLOPRAMIDE HCL 5 MG/ML IJ SOLN
10.0000 mg | Freq: Once | INTRAMUSCULAR | Status: AC
  Administered 2018-08-08: 10 mg via INTRAVENOUS

## 2018-08-08 MED ORDER — OXYCODONE HCL 5 MG PO TABS
5.0000 mg | ORAL_TABLET | ORAL | Status: DC | PRN
  Administered 2018-08-08 – 2018-08-11 (×3): 5 mg via ORAL
  Filled 2018-08-08 (×3): qty 1

## 2018-08-08 MED ORDER — DEXTROSE 5 % IV SOLN: 1.0000 g | Freq: Two times a day (BID) | INTRAVENOUS | Status: DC

## 2018-08-08 MED ORDER — SODIUM CHLORIDE 0.9 % IV SOLN
1.0000 g | Freq: Two times a day (BID) | INTRAVENOUS | Status: DC
  Filled 2018-08-08 (×2): qty 10

## 2018-08-08 MED ORDER — PRENATAL MULTIVITAMIN CH: 1.0000 | ORAL_TABLET | Freq: Every day | ORAL | Status: DC

## 2018-08-08 MED ORDER — SODIUM CHLORIDE 0.9 % IV SOLN
INTRAVENOUS | Status: DC
  Administered 2018-08-08 – 2018-08-10 (×7): via INTRAVENOUS

## 2018-08-08 MED ORDER — PRENATAL MULTIVITAMIN CH
1.0000 | ORAL_TABLET | Freq: Every day | ORAL | Status: DC
  Administered 2018-08-08 – 2018-08-11 (×4): 1 via ORAL
  Filled 2018-08-08 (×4): qty 1

## 2018-08-08 MED ORDER — DOCUSATE SODIUM 100 MG PO CAPS: 100.0000 mg | ORAL_CAPSULE | Freq: Every day | ORAL | Status: DC

## 2018-08-08 MED ORDER — SODIUM CHLORIDE 0.9 % IV SOLN
INTRAVENOUS | Status: DC
  Administered 2018-08-08 (×2): via INTRAVENOUS

## 2018-08-08 MED ORDER — POTASSIUM CHLORIDE 20 MEQ PO PACK
40.0000 meq | PACK | Freq: Once | ORAL | Status: AC
  Administered 2018-08-08: 40 meq via ORAL
  Filled 2018-08-08: qty 2

## 2018-08-08 MED ORDER — SODIUM CHLORIDE 0.9 % IV SOLN
2.0000 g | INTRAVENOUS | Status: DC
  Administered 2018-08-08: 2 g via INTRAVENOUS
  Filled 2018-08-08: qty 2

## 2018-08-08 MED ORDER — ONDANSETRON HCL 4 MG/2ML IJ SOLN
4.0000 mg | INTRAMUSCULAR | Status: DC | PRN
  Administered 2018-08-08 – 2018-08-10 (×3): 4 mg via INTRAVENOUS
  Filled 2018-08-08 (×3): qty 2

## 2018-08-08 MED ORDER — ACETAMINOPHEN 325 MG PO TABS
325.0000 mg | ORAL_TABLET | Freq: Once | ORAL | Status: AC
  Administered 2018-08-08: 325 mg via ORAL
  Filled 2018-08-08: qty 1

## 2018-08-08 MED ORDER — ACETAMINOPHEN 325 MG PO TABS
650.0000 mg | ORAL_TABLET | ORAL | Status: DC | PRN
  Administered 2018-08-08 (×2): 650 mg via ORAL
  Filled 2018-08-08 (×2): qty 2

## 2018-08-08 MED ORDER — ACETAMINOPHEN 325 MG PO TABS: 650.0000 mg | ORAL_TABLET | ORAL | Status: DC | PRN

## 2018-08-08 MED ORDER — CALCIUM CARBONATE ANTACID 500 MG PO CHEW: 2.0000 | CHEWABLE_TABLET | ORAL | Status: DC | PRN

## 2018-08-08 MED ORDER — ACETAMINOPHEN 500 MG PO TABS
1000.0000 mg | ORAL_TABLET | Freq: Four times a day (QID) | ORAL | Status: DC
  Administered 2018-08-08 – 2018-08-10 (×7): 1000 mg via ORAL
  Filled 2018-08-08 (×8): qty 2

## 2018-08-08 MED ORDER — SODIUM CHLORIDE 0.9 % IV SOLN
500.0000 mg | Freq: Three times a day (TID) | INTRAVENOUS | Status: DC
  Administered 2018-08-08 – 2018-08-10 (×6): 500 mg via INTRAVENOUS
  Filled 2018-08-08 (×2): qty 500
  Filled 2018-08-08 (×2): qty 0.5
  Filled 2018-08-08 (×2): qty 500
  Filled 2018-08-08 (×2): qty 0.5
  Filled 2018-08-08: qty 500

## 2018-08-08 MED ORDER — DOCUSATE SODIUM 50 MG PO CAPS: 100.0000 mg | ORAL_CAPSULE | Freq: Every day | ORAL | Status: DC

## 2018-08-08 MED ORDER — KCL-LACTATED RINGERS 20 MEQ/L IV SOLN: INTRAVENOUS | Status: DC

## 2018-08-08 NOTE — Progress Notes (Signed)
Daily Antepartum Note  Admission Date: 08/07/2018 Current Date: 08/08/2018 10:22 AM  Molly Graves is a 28 y.o. [id number] @ [redacted]w[redacted]d admitted for pyelonephritis.  Patient Active Problem List   Diagnosis Date Noted  . Pyelonephritis affecting pregnancy in second trimester 08/08/2018  . Supervision of other normal pregnancy, antepartum 05/22/2018  . Complication of intrauterine device (IUD) (HCC) 04/19/2016    Overnight/24hr events:  Has been afebrile for several hours. Blood cultures returned positive for enterobacter and E. Coli.  Subjective:  Still has chills and is drinking warm liquids. Pain level is about the same.   Objective:   Vitals:   08/08/18 0605 08/08/18 0758  BP:  (!) 109/53  Pulse:  (!) 102  Resp:  18  Temp: 99.8 F (37.7 C) 98.7 F (37.1 C)  SpO2:  100%   Temp:  [98.1 F (36.7 C)-101.2 F (38.4 C)] 98.7 F (37.1 C) (02/22 0758) Pulse Rate:  [92-130] 102 (02/22 0758) Resp:  [16-20] 18 (02/22 0758) BP: (100-114)/(53-67) 109/53 (02/22 0758) SpO2:  [99 %-100 %] 100 % (02/22 0758) Weight:  [76 kg] 76 kg (02/22 0155) Temp (24hrs), Avg:99.2 F (37.3 C), Min:98.1 F (36.7 C), Max:101.2 F (38.4 C)   Intake/Output Summary (Last 24 hours) at 08/08/2018 1022 Last data filed at 08/08/2018 1010 Gross per 24 hour  Intake 2719.44 ml  Output 475 ml  Net 2244.44 ml     Current Vital Signs 24h Vital Sign Ranges  T 98.7 F (37.1 C) Temp  Avg: 99.2 F (37.3 C)  Min: 98.1 F (36.7 C)  Max: 101.2 F (38.4 C)  BP (!) 109/53 BP  Min: 100/62  Max: 114/67  HR (!) 102 Pulse  Avg: 106.5  Min: 92  Max: 130  RR 18 Resp  Avg: 17.3  Min: 16  Max: 20  SaO2 100 % Room Air SpO2  Avg: 99.7 %  Min: 99 %  Max: 100 %       24 Hour I/O Current Shift I/O  Time Ins Outs 02/21 0701 - 02/22 0700 In: 2099.4 [I.V.:0.7] Out: -  02/22 0701 - 02/22 1900 In: 620 [P.O.:120; I.V.:500] Out: 475 [Urine:475]   Patient Vitals for the past 24 hrs:  BP Temp Temp src Pulse Resp SpO2 Height  Weight  08/08/18 0758 (!) 109/53 98.7 F (37.1 C) Oral (!) 102 18 100 % - -  08/08/18 0605 - 99.8 F (37.7 C) Oral - - - - -  08/08/18 0409 (!) 109/53 99.1 F (37.3 C) Oral (!) 125 16 100 % - -  08/08/18 0155 - - - - - - 5\' 4"  (1.626 m) 76 kg  08/08/18 0154 114/67 98.4 F (36.9 C) Oral 97 16 100 % - -  08/07/18 2330 100/62 - - 93 20 99 % - -  08/07/18 2305 (!) 100/57 98.1 F (36.7 C) Oral 92 19 99 % - -  08/07/18 1859 (!) 108/54 (!) 101.2 F (38.4 C) Oral (!) 130 16 100 % - -  08/07/18 1558 - - - - - - 5\' 4"  (1.626 m) 76 kg  08/07/18 1557 114/63 99.2 F (37.3 C) Oral - 16 100 % - -    Physical exam: General: Well nourished, well developed female in no acute distress. Abdomen: mild tenderness, pain mostly in back on R side Cardiovascular: Tachycardia Respiratory: No increased work of breathing Skin: Warm and dry.  Neuro: Alert and oriented  Medications: Current Facility-Administered Medications  Medication Dose Route Frequency Provider Last Rate  Last Dose  . 0.9 %  sodium chloride infusion   Intravenous Continuous Farrel Conners, CNM 125 mL/hr at 08/08/18 1004    . acetaminophen (TYLENOL) tablet 650 mg  650 mg Oral Q4H PRN Farrel Conners, CNM   650 mg at 08/08/18 1009  . calcium carbonate (TUMS - dosed in mg elemental calcium) chewable tablet 400 mg of elemental calcium  2 tablet Oral Q4H PRN Farrel Conners, CNM      . cefTRIAXone (ROCEPHIN) 2 g in sodium chloride 0.9 % 100 mL IVPB  2 g Intravenous Q24H Oswaldo Conroy, CNM 200 mL/hr at 08/08/18 1005 2 g at 08/08/18 1005  . ondansetron (ZOFRAN) injection 4 mg  4 mg Intravenous Q4H PRN Farrel Conners, CNM      . oxyCODONE (Oxy IR/ROXICODONE) immediate release tablet 5 mg  5 mg Oral Q4H PRN Farrel Conners, CNM   5 mg at 08/08/18 2536  . prenatal multivitamin tablet 1 tablet  1 tablet Oral Q1200 Farrel Conners, CNM        Assessment & Plan:  28 year old [id number] at [redacted]w[redacted]d with right pyelonephritis and  positive blood culture.  1) Rocephin IV dosage changed to 2g every 24 hours per pharmacy recommendation after review of culture results. 2) PRN Tylenol and oxycodone for pain. 3) Vital signs every 4 hours. 4) Continue IV fluids/intake and output. 5) Advance diet as tolerated; PRN Zofran for nausea.  Marcelyn Bruins, CNM 08/08/2018

## 2018-08-08 NOTE — H&P (Signed)
OB History & Physical   History of Present Illness:  Chief Complaint:  Admitted from ED with right pyelonephritis. HPI:  Molly Graves is a 28 y.o. [id number] female with EDC=01/11/2019 at [redacted]w[redacted]d dated by LMP=9wk ultrasound.  Her pregnancy has been complicated by nausea and vomiting in her first trimester.  She presents to L&D from the ER with a diagnosis of pyelonephritis after presenting with body aches, malaise, dysuria, chills, and urinary frequency. She spiked a fever to 101.2 at 1800 last night and was tachycardic with pulse up to  130 BPM. WBC 11.1 and urinalysis remarkable for large amount of leukocytes, +2 proteinuria, >50 WBC/hpf and rare bacteria. Intravenous hydration was started and she received a dose of Rocephin IV. Urine culture and blood cultures are pending. Because patient was complaining of RLQ pain, an MRI was done to rule out appendicitis. There was no evidence of appendicitis, but the MRI did reveal the following:  Adrenal glands are normal. Mild enlargement of left extrarenal pelvis. Mild to moderate right hydronephrosis. Focal slightly wedge-shaped hypointensity within the mid right kidney. Mild right perinephric edema/perinephric fluid. The bladder is unremarkable.  On admission to the [facility] unit, ROS was also positive for right thoracic back pain, transient nausea, chills. Had one loose stool yesterday. No prior history of UTI or stones.  Prenatal care site: Prenatal care at [facility] is also remarkable for a history of HSV 2, headaches, and postpartum depression.   [facility] Prenatal Labs  Dating [place] Blood type: O/Positive/-- (12/23 1605)   Genetic Screen declines Antibody:Negative (12/23 1605)  Anatomic [place]  Rubella: 1.86 (12/23 1605) Varicella:    GTT Third trimester:  RPR: Non Reactive (12/23 1605)   Rhogam O+ HBsAg: Negative (12/23 1605)   TDaP vaccine              Flu Shot: HIV: Non Reactive (12/23 1605)   Baby Food                                GBS: pending   Contraception Declines BTL, IUD.  Undecided ION:GEXBM PP  CBB     CS/VBAC na   Support Person            Maternal Medical History:   Past Medical History:  Diagnosis Date  . Anemia   . Depression    post partum depression  . Headache    MIGRAINES  . Herpes genitalia     Past Surgical History:  Procedure Laterality Date  . BREAST SURGERY     lumpectomy left breast  . DIAGNOSTIC LAPAROSCOPY  2015   to rule out uterine injury after D&E  . HYSTEROSCOPY W/D&C N/A 04/19/2016   Procedure: DILATATION AND CURETTAGE /HYSTEROSCOPY;  Surgeon: Nadara Mustard, MD;  Location: [facility] ORS;  Service: Gynecology;  Laterality: N/A;  . INDUCED ABORTION  2015  . IUD REMOVAL N/A 04/19/2016   Procedure: INTRAUTERINE DEVICE (IUD) REMOVAL;  Surgeon: Nadara Mustard, MD;  Location: [facility] ORS;  Service: Gynecology;  Laterality: N/A;  . LAPAROSCOPY N/A 04/19/2016   Procedure: LAPAROSCOPY DIAGNOSTIC;  Surgeon: Nadara Mustard, MD;  Location: [facility] ORS;  Service: Gynecology;  Laterality: N/A;    No Known Allergies  Prior to Admission medications   Medication Sig Start Date End Date Taking? Authorizing Provider  acetaminophen (TYLENOL) 500 MG tablet Take 500 mg by mouth every 6 (six) hours as needed for mild pain, moderate pain, fever or  headache.   Yes [provider]  Doxylamine-Pyridoxine (DICLEGIS) 10-10 MG TBEC Take 2 tablets by mouth at bedtime. If symptoms persist, add one tablet in the morning and one in the afternoon 07/06/18  Yes Nadara Mustard, MD  Prenat-FeFum-FePo-FA-DHA w/o A (PROVIDA DHA) 16-16-1.25-110 MG CAPS Take 1 capsule by mouth daily. 06/08/18  Yes Nadara Mustard, MD    Social History: She  reports that she has quit smoking. Her smoking use included cigars. She quit after 7.00 years of use. She has never used smokeless tobacco. She reports previous alcohol use. She reports that she does not use drugs.  Family History: family history includes Cancer (age of onset: 73) in an  other family member; Hypertension in her maternal aunt and maternal grandmother.   Review of Systems: Negative x 10 systems reviewed except as noted in the HPI.      Physical Exam:  Vital Signs: BP (!) 109/53 (BP Location: Left Arm)   Pulse (!) 125   Temp 99.1 F (37.3 C) (Oral)   Resp 16   Ht  (1.626 m)   Wt 76 kg   LMP 04/06/2018 (Exact Date)   SpO2 100%   BMI 28.76 kg/m  General: BF, resting in bed, no acute distress.  HEENT: normocephalic, atraumatic Heart: Tachycardia with normal rhythm. Grade II/VI systolic murmur heard in all areas. Lungs: clear to auscultation bilaterally Abdomen: soft, gravid,non tender uterus with FH at U-1FB. FHTs in ER were WNL Pelvic: cervical exam in ER: closed Back: positive right CVAT Extremities: non-tender, symmetric, no edema bilaterally.  Neurologic: sleeping, but easily aroused & oriented x 3.    Results for orders placed or performed during the hospital encounter of 08/07/18 (from the past 24 hour(s))  Influenza panel by PCR (type A & B)     Status: None   Collection Time: 08/07/18  5:05 PM  Result Value Ref Range   Influenza A By PCR NEGATIVE NEGATIVE   Influenza B By PCR NEGATIVE NEGATIVE  Urinalysis, Complete w Microscopic     Status: Abnormal   Collection Time: 08/07/18  5:08 PM  Result Value Ref Range   Color, Urine YELLOW (A) YELLOW   APPearance CLOUDY (A) CLEAR   Specific Gravity, Urine 1.015 1.005 - 1.030   pH 6.0 5.0 - 8.0   Glucose, UA NEGATIVE NEGATIVE mg/dL   Hgb urine dipstick NEGATIVE NEGATIVE   Bilirubin Urine NEGATIVE NEGATIVE   Ketones, ur 20 (A) NEGATIVE mg/dL   Protein, ur 161 (A) NEGATIVE mg/dL   Nitrite NEGATIVE NEGATIVE   Leukocytes,Ua LARGE (A) NEGATIVE   RBC / HPF 21-50 0 - 5 RBC/hpf   WBC, UA >50 (H) 0 - 5 WBC/hpf   Bacteria, UA RARE (A) NONE SEEN   Squamous Epithelial / LPF 0-5 0 - 5   WBC Clumps PRESENT    Mucus PRESENT   Comprehensive metabolic panel     Status: Abnormal   Collection Time:  08/07/18  7:10 PM  Result Value Ref Range   Sodium 131 (L) 135 - 145 mmol/L   Potassium 3.1 (L) 3.5 - 5.1 mmol/L   Chloride 105 98 - 111 mmol/L   CO2 19 (L) 22 - 32 mmol/L   Glucose, Bld 107 (H) 70 - 99 mg/dL   BUN 6 6 - 20 mg/dL   Creatinine, Ser 0.96 0.44 - 1.00 mg/dL   Calcium 8.2 (L) 8.9 - 10.3 mg/dL   Total Protein 6.3 (L) 6.5 - 8.1 g/dL  Albumin 2.8 (L) 3.5 - 5.0 g/dL   AST 18 15 - 41 U/L   ALT 15 0 - 44 U/L   Alkaline Phosphatase 59 38 - 126 U/L   Total Bilirubin 0.6 0.3 - 1.2 mg/dL   GFR calc non Af Amer >60 >60 mL/min   GFR calc Af Amer >60 >60 mL/min   Anion gap 7 5 - 15  CBC with Differential     Status: Abnormal   Collection Time: 08/07/18  7:10 PM  Result Value Ref Range   WBC 11.1 (H) 4.0 - 10.5 K/uL   RBC 3.72 (L) 3.87 - 5.11 MIL/uL   Hemoglobin 10.4 (L) 12.0 - 15.0 g/dL   HCT 16.1 (L) 09.6 - 04.5 %   MCV 85.2 80.0 - 100.0 fL   MCH 28.0 26.0 - 34.0 pg   MCHC 32.8 30.0 - 36.0 g/dL   RDW 40.9 81.1 - 91.4 %   Platelets 199 150 - 400 K/uL   nRBC 0.0 0.0 - 0.2 %   Neutrophils Relative % 86 %   Neutro Abs 9.5 (H) 1.7 - 7.7 K/uL   Lymphocytes Relative 5 %   Lymphs Abs 0.5 (L) 0.7 - 4.0 K/uL   Monocytes Relative 8 %   Monocytes Absolute 0.9 0.1 - 1.0 K/uL   Eosinophils Relative 0 %   Eosinophils Absolute 0.0 0.0 - 0.5 K/uL   Basophils Relative 0 %   Basophils Absolute 0.0 0.0 - 0.1 K/uL   Immature Granulocytes 1 %   Abs Immature Granulocytes 0.07 0.00 - 0.07 K/uL  hCG, quantitative, pregnancy     Status: Abnormal   Collection Time: 08/07/18  7:11 PM  Result Value Ref Range   hCG, Beta Chain, Quant, S 22,075 (H) <5 mIU/mL  Culture, blood (routine x 2)     Status: None (Preliminary result)   Collection Time: 08/07/18  7:22 PM  Result Value Ref Range   Specimen Description BLOOD RIGHT ANTECUBITAL    Special Requests      BOTTLES DRAWN AEROBIC AND ANAEROBIC Blood Culture adequate volume   Culture  Setup Time      Organism ID to follow GRAM NEGATIVE  RODS ANAEROBIC BOTTLE ONLY CRITICAL RESULT CALLED TO, READ BACK BY AND VERIFIED WITH: Performed at [facility], [address] [place], [place] [address]    Culture GRAM NEGATIVE RODS    Report Status PENDING   Lactic acid, plasma     Status: None   Collection Time: 08/07/18  9:11 PM  Result Value Ref Range   Lactic Acid, Venous 1.0 0.5 - 1.9 mmol/L  Chlamydia/NGC rt PCR     Status: None   Collection Time: 08/07/18  9:50 PM  Result Value Ref Range   Specimen source GC/Chlam ENDOCERVICAL    Chlamydia Tr NOT DETECTED NOT DETECTED   N gonorrhoeae NOT DETECTED NOT DETECTED  Wet prep, genital     Status: Abnormal   Collection Time: 08/07/18  9:50 PM  Result Value Ref Range   Yeast Wet Prep HPF POC NONE SEEN NONE SEEN   Trich, Wet Prep NONE SEEN NONE SEEN   Clue Cells Wet Prep HPF POC NONE SEEN NONE SEEN   WBC, Wet Prep HPF POC RARE (A) NONE SEEN   Sperm NONE SEEN    Mr Pelvis Wo Contrast  Result Date: 08/08/2018 CLINICAL DATA:  Fever body aches pregnant patient EXAM: MRI ABDOMEN AND PELVIS WITHOUT CONTRAST TECHNIQUE: Multiplanar multisequence MR imaging of the abdomen and pelvis  was performed. No intravenous contrast was administered. Note that the examination was not tailored to evaluate the fetus or placenta. COMPARISON:  Ultrasound 08/07/2018 FINDINGS: COMBINED FINDINGS FOR BOTH MR ABDOMEN AND PELVIS Lower chest: Trace pleural effusions.  Normal heart size. Hepatobiliary: The liver is unremarkable. No calcified gallstones. No biliary dilatation. Pancreas: No mass, inflammatory changes, or other parenchymal abnormality identified. Spleen:  Within normal limits in size and appearance. Adrenals/Urinary Tract: Adrenal glands are normal. Mild enlargement of left extrarenal pelvis. Mild to moderate right hydronephrosis. Focal slightly wedge-shaped hypointensity within the mid right kidney. Mild right perinephric edema/perinephric fluid. The bladder is unremarkable. Stomach/Bowel: No  bowel wall thickening or edema. The appendix is not confidently identified however there is no right lower quadrant inflammatory process. Vascular/Lymphatic: Nonaneurysmal aorta. No grossly enlarged lymph nodes Reproductive: Single IUP.  Ovaries are unremarkable Other:  Free fluid in the pelvis, small amount. Musculoskeletal: No marrow edema IMPRESSION: 1. The appendix is not clearly identified however there is no right lower quadrant inflammatory process. 2. Moderate right hydronephrosis and hydroureter. There is mild right perinephric edema and fluid, raising possibility of pyelonephritis. Focal wedge-shaped area of altered intensity in the mid right kidney, question atypical appearance of pyelonephritis versus complex cyst versus infected lesion. Could attempt further characterisation with renal ultrasound. 3. Small amount of free fluid in the pelvis Electronically Signed   By: Jasmine Pang M.D.   On: 08/08/2018 01:44   Mr Abdomen Wo Contrast  Result Date: 08/08/2018 CLINICAL DATA:  Fever body aches pregnant patient EXAM: MRI ABDOMEN AND PELVIS WITHOUT CONTRAST TECHNIQUE: Multiplanar multisequence MR imaging of the abdomen and pelvis was performed. No intravenous contrast was administered. Note that the examination was not tailored to evaluate the fetus or placenta. COMPARISON:  Ultrasound 08/07/2018 FINDINGS: COMBINED FINDINGS FOR BOTH MR ABDOMEN AND PELVIS Lower chest: Trace pleural effusions.  Normal heart size. Hepatobiliary: The liver is unremarkable. No calcified gallstones. No biliary dilatation. Pancreas: No mass, inflammatory changes, or other parenchymal abnormality identified. Spleen:  Within normal limits in size and appearance. Adrenals/Urinary Tract: Adrenal glands are normal. Mild enlargement of left extrarenal pelvis. Mild to moderate right hydronephrosis. Focal slightly wedge-shaped hypointensity within the mid right kidney. Mild right perinephric edema/perinephric fluid. The bladder is  unremarkable. Stomach/Bowel: No bowel wall thickening or edema. The appendix is not confidently identified however there is no right lower quadrant inflammatory process. Vascular/Lymphatic: Nonaneurysmal aorta. No grossly enlarged lymph nodes Reproductive: Single IUP.  Ovaries are unremarkable Other:  Free fluid in the pelvis, small amount. Musculoskeletal: No marrow edema IMPRESSION: 1. The appendix is not clearly identified however there is no right lower quadrant inflammatory process. 2. Moderate right hydronephrosis and hydroureter. There is mild right perinephric edema and fluid, raising possibility of pyelonephritis. Focal wedge-shaped area of altered intensity in the mid right kidney, question atypical appearance of pyelonephritis versus complex cyst versus infected lesion. Could attempt further characterisation with renal ultrasound. 3. Small amount of free fluid in the pelvis Electronically Signed   By: Jasmine Pang M.D.   On: 08/08/2018 01:44   US Ob Limited  Result Date: 08/07/2018 CLINICAL DATA:  Right lower quadrant pain. EXAM: LIMITED OBSTETRIC ULTRASOUND FINDINGS: Number of Fetuses: 1 Heart Rate:  165 bpm Movement: Visualized. Presentation: Cephalic Placental Location: Posterior Previa: Marginal Amniotic Fluid (Subjective):  Within normal limits. BPD: 3.9 cm 17 w  6 d MATERNAL FINDINGS: Cervix:  Appears closed. Uterus/Adnexae: No abnormality visualized. IMPRESSION: Single living intrauterine gestation at estimated 17 week 6 day  gestational age by BPD. Marginal placenta previa without acute findings. This exam is performed on an emergent basis and does not comprehensively evaluate fetal size, dating, or anatomy; follow-up complete OB US should be considered if further fetal assessment is warranted. Electronically Signed   By: Kennith Center M.D.   On: 08/07/2018 21:12    Assessment:  Molly Graves is a 28 y.o. [id number] female at [redacted]w[redacted]d with right pyelonephritis.   Plan:  1. Admit to M-C unit.  Discussed admission with Dr Bonney Aid. 2. Continue IV hydration with NS at 121ml/hour. I&O 3. Rocephin 1 gm every 12 hours 4. Zofran for nausea prn 5. Tylenol for fever/ pain. Roxicodone for moderate to severe pain 6. Vital signs q4 hours 7. Urine culture and blood cultures pending 8. Diet as tolerated  Farrel Conners  08/08/2018 5:16 AM

## 2018-08-08 NOTE — ED Notes (Signed)
Jenna RN, aware of bed assigned  

## 2018-08-08 NOTE — Progress Notes (Signed)
Entered room with off going RN for bedside report. Pt asleep in bed with S/O beside her.

## 2018-08-08 NOTE — ED Notes (Signed)
Provided pt with [place] sandwich, grape juice and cranberry  Juice

## 2018-08-08 NOTE — Progress Notes (Signed)
PHARMACY - PHYSICIAN COMMUNICATION CRITICAL VALUE ALERT - BLOOD CULTURE IDENTIFICATION (BCID)  Molly Graves is an 28 y.o. female who presented to [facility] on 08/07/2018 with a chief complaint of Right Pyelonephritis   Assessment:  BCID showing E.Coli in 1 of 4 bottles.   Name of physician (or Provider) Contacted: Marcelyn Bruins, CNM  Current antibiotics: Ceftriaxone 1g IV every 12 hours.   Changes to prescribed antibiotics recommended:  Ceftriaxone 2g IV every 24 hours   Results for orders placed or performed during the hospital encounter of 08/07/18  Blood Culture ID Panel (Reflexed) (Collected: 08/07/2018  7:22 PM)  Result Value Ref Range   Enterococcus species NOT DETECTED NOT DETECTED   Listeria monocytogenes NOT DETECTED NOT DETECTED   Staphylococcus species NOT DETECTED NOT DETECTED   Staphylococcus aureus (BCID) NOT DETECTED NOT DETECTED   Streptococcus species NOT DETECTED NOT DETECTED   Streptococcus agalactiae NOT DETECTED NOT DETECTED   Streptococcus pneumoniae NOT DETECTED NOT DETECTED   Streptococcus pyogenes NOT DETECTED NOT DETECTED   Acinetobacter baumannii NOT DETECTED NOT DETECTED   Enterobacteriaceae species DETECTED (A) NOT DETECTED   Enterobacter cloacae complex NOT DETECTED NOT DETECTED   Escherichia coli DETECTED (A) NOT DETECTED   Klebsiella oxytoca NOT DETECTED NOT DETECTED   Klebsiella pneumoniae NOT DETECTED NOT DETECTED   Proteus species NOT DETECTED NOT DETECTED   Serratia marcescens NOT DETECTED NOT DETECTED   Carbapenem resistance NOT DETECTED NOT DETECTED   Haemophilus influenzae NOT DETECTED NOT DETECTED   Neisseria meningitidis NOT DETECTED NOT DETECTED   Pseudomonas aeruginosa NOT DETECTED NOT DETECTED   Candida albicans NOT DETECTED NOT DETECTED   Candida glabrata NOT DETECTED NOT DETECTED   Candida krusei NOT DETECTED NOT DETECTED   Candida parapsilosis NOT DETECTED NOT DETECTED   Candida tropicalis NOT DETECTED NOT DETECTED     Gardner Candle, PharmD, BCPS Clinical Pharmacist 08/08/2018 8:16 AM

## 2018-08-08 NOTE — Progress Notes (Signed)
CHIEF COMPLAINT:   Chief Complaint  Patient presents with  . Generalized Body Aches    Subjective  Patient in bed resting. Alert, oriented, family at bedside. Has been tolerating diet. Has had continued fevers and chills. Antibiotic recently changed.  She reports normal fetal movement.  Nursing obtained FHR 167bpm.   Objective   Examination:  General exam: Appears calm and comfortable  Respiratory system: Clear to auscultation. Respiratory effort normal. HEENT: [place]/AT, PERRLA, no thrush, no stridor. Cardiovascular system: Tachycardia Gastrointestinal system: Abdomen is nondistended, soft and nontender. No organomegaly or masses felt. Normal bowel sounds heard. No fundal tenderness Central nervous system: Alert and oriented. No focal neurological deficits. Extremities: Symmetric 5 x 5 power. Skin: No rashes, lesions or ulcers. Warm to the touch. Psychiatry: Judgement and insight appear normal. Mood & affect appropriate.   VITALS:  height is 5\' 4"  (1.626 m) and weight is 76 kg. Her axillary temperature is 102.2 F (39 C) (abnormal). Her blood pressure is 102/61 and her pulse is 107 (abnormal). Her respiration is 20 and oxygen saturation is 98%.   I personally reviewed Labs under Results section.  Radiology Reports Mr Pelvis Wo Contrast  Result Date: 08/08/2018 CLINICAL DATA:  Fever body aches pregnant patient EXAM: MRI ABDOMEN AND PELVIS WITHOUT CONTRAST TECHNIQUE: Multiplanar multisequence MR imaging of the abdomen and pelvis was performed. No intravenous contrast was administered. Note that the examination was not tailored to evaluate the fetus or placenta. COMPARISON:  Ultrasound 08/07/2018 FINDINGS: COMBINED FINDINGS FOR BOTH MR ABDOMEN AND PELVIS Lower chest: Trace pleural effusions.  Normal heart size. Hepatobiliary: The liver is unremarkable. No calcified gallstones. No biliary dilatation. Pancreas: No mass, inflammatory changes, or other parenchymal abnormality identified.  Spleen:  Within normal limits in size and appearance. Adrenals/Urinary Tract: Adrenal glands are normal. Mild enlargement of left extrarenal pelvis. Mild to moderate right hydronephrosis. Focal slightly wedge-shaped hypointensity within the mid right kidney. Mild right perinephric edema/perinephric fluid. The bladder is unremarkable. Stomach/Bowel: No bowel wall thickening or edema. The appendix is not confidently identified however there is no right lower quadrant inflammatory process. Vascular/Lymphatic: Nonaneurysmal aorta. No grossly enlarged lymph nodes Reproductive: Single IUP.  Ovaries are unremarkable Other:  Free fluid in the pelvis, small amount. Musculoskeletal: No marrow edema IMPRESSION: 1. The appendix is not clearly identified however there is no right lower quadrant inflammatory process. 2. Moderate right hydronephrosis and hydroureter. There is mild right perinephric edema and fluid, raising possibility of pyelonephritis. Focal wedge-shaped area of altered intensity in the mid right kidney, question atypical appearance of pyelonephritis versus complex cyst versus infected lesion. Could attempt further characterisation with renal ultrasound. 3. Small amount of free fluid in the pelvis Electronically Signed   By: Jasmine Pang M.D.   On: 08/08/2018 01:44   Mr Abdomen Wo Contrast  Result Date: 08/08/2018 CLINICAL DATA:  Fever body aches pregnant patient EXAM: MRI ABDOMEN AND PELVIS WITHOUT CONTRAST TECHNIQUE: Multiplanar multisequence MR imaging of the abdomen and pelvis was performed. No intravenous contrast was administered. Note that the examination was not tailored to evaluate the fetus or placenta. COMPARISON:  Ultrasound 08/07/2018 FINDINGS: COMBINED FINDINGS FOR BOTH MR ABDOMEN AND PELVIS Lower chest: Trace pleural effusions.  Normal heart size. Hepatobiliary: The liver is unremarkable. No calcified gallstones. No biliary dilatation. Pancreas: No mass, inflammatory changes, or other  parenchymal abnormality identified. Spleen:  Within normal limits in size and appearance. Adrenals/Urinary Tract: Adrenal glands are normal. Mild enlargement of left extrarenal pelvis. Mild to moderate right  hydronephrosis. Focal slightly wedge-shaped hypointensity within the mid right kidney. Mild right perinephric edema/perinephric fluid. The bladder is unremarkable. Stomach/Bowel: No bowel wall thickening or edema. The appendix is not confidently identified however there is no right lower quadrant inflammatory process. Vascular/Lymphatic: Nonaneurysmal aorta. No grossly enlarged lymph nodes Reproductive: Single IUP.  Ovaries are unremarkable Other:  Free fluid in the pelvis, small amount. Musculoskeletal: No marrow edema IMPRESSION: 1. The appendix is not clearly identified however there is no right lower quadrant inflammatory process. 2. Moderate right hydronephrosis and hydroureter. There is mild right perinephric edema and fluid, raising possibility of pyelonephritis. Focal wedge-shaped area of altered intensity in the mid right kidney, question atypical appearance of pyelonephritis versus complex cyst versus infected lesion. Could attempt further characterisation with renal ultrasound. 3. Small amount of free fluid in the pelvis Electronically Signed   By: Jasmine Pang M.D.   On: 08/08/2018 01:44   US Ob Limited  Result Date: 08/07/2018 CLINICAL DATA:  Right lower quadrant pain. EXAM: LIMITED OBSTETRIC ULTRASOUND FINDINGS: Number of Fetuses: 1 Heart Rate:  165 bpm Movement: Visualized. Presentation: Cephalic Placental Location: Posterior Previa: Marginal Amniotic Fluid (Subjective):  Within normal limits. BPD: 3.9 cm 17 w  6 d MATERNAL FINDINGS: Cervix:  Appears closed. Uterus/Adnexae: No abnormality visualized. IMPRESSION: Single living intrauterine gestation at estimated 17 week 6 day gestational age by BPD. Marginal placenta previa without acute findings. This exam is performed on an emergent basis  and does not comprehensively evaluate fetal size, dating, or anatomy; follow-up complete OB US should be considered if further fetal assessment is warranted. Electronically Signed   By: Kennith Center M.D.   On: 08/07/2018 21:12       Assessment/Plan:  28 yo with Enterobacter and E.Coli Sepsis, likely urinary source 1. Antibiotics recently switched to Meropenem since she has not had clinical improvement.  2. Tylenol 1,000mg  q6 hrs 3. IVF: 0.9 NS at 125 cc hr 4. Electrolyte abnormalities, will check panel and replace as appropriate.  5. Evening labs planned for 20:00 6. Continue FHT daily 7. Discussed Fan, ice pack, and possible shower this evening if Jeffifer is feeling up for it.   Code Status: Full  Family Communication:  At bedside Time spent: 15 minutes   LOS: 0 days   Emillee Talsma R Colsen Modi

## 2018-08-08 NOTE — ED Notes (Signed)
Patient's guest to nursing station to report that patient is actively vomiting. Silvia RN to bedside.

## 2018-08-08 NOTE — ED Provider Notes (Signed)
Assumed care of the patient from Dr. Sharma Covert at 11:00 PM with recommendation to follow-up on MRI results.  MRI findings consistent with right pyelonephritis.  As such patient discussed with Sharen Hones NP for hospital admission for further management.   Darci Current, MD 08/08/18 [phone]

## 2018-08-08 NOTE — ED Notes (Signed)
ED TO INPATIENT HANDOFF REPORT  Name/Age/Gender Molly Graves 28 y.o. female  Code Status    Code Status Orders  (From admission, onward)         Start     Ordered   08/08/18 0304  Full code  Continuous     08/08/18 0306        Code Status History    Date Active Date Inactive Code Status Order ID Comments User Context   08/08/2018 0257 08/08/2018 0306 Full Code [id number]  Farrel Conners, CNM Outpatient   08/08/2018 0257 08/08/2018 0257 Full Code [id number]  Farrel Conners, CNM Outpatient   08/04/2015 1226 08/06/2015 1750 Full Code [id number]  Farrel Conners, CNM Inpatient   08/03/2015 1117 08/04/2015 1226 Full Code [id number]  Marta Antu, CNM Inpatient   08/03/2015 1104 08/03/2015 1117 Full Code [id number]  Farrel Conners, CNM Inpatient      Home/SNF/Other Home  Chief Complaint flu like symptoms 17 weeks preg  Level of Care/Admitting Diagnosis ED Disposition    ED Disposition Condition Comment   Admit  Hospital Area: [facility] [[id number]]  Level of Care: Antepartum [20]  Diagnosis: Pyelonephritis affecting pregnancy in second trimester [1499300]  Admitting Physician: Vena Austria [id number]  Attending Physician: Farrel Conners [phone]  Estimated length of stay: past midnight tomorrow  Certification:: I certify this patient will need inpatient services for at least 2 midnights  Bed request comments: 351  PT Class (Do Not Modify): Inpatient [101]  PT Acc Code (Do Not Modify): Private [1]       Medical History Past Medical History:  Diagnosis Date  . Anemia   . Depression    post partum depression  . Headache    MIGRAINES  . Herpes genitalia     Allergies No Known Allergies  IV Location/Drains/Wounds Patient Lines/Drains/Airways Status   Active Line/Drains/Airways    Name:   Placement date:   Placement time:   Site:   Days:   Peripheral IV 08/07/18 Right Antecubital   08/07/18    1941    Antecubital   1    Incision (Closed) 04/19/16 Abdomen   04/19/16    1427     841   Incision - 2 Ports Abdomen Umbilicus Right;Lower   04/19/16    1410     841          Labs/Imaging Results for orders placed or performed during the hospital encounter of 08/07/18 (from the past 48 hour(s))  Influenza panel by PCR (type A & B)     Status: None   Collection Time: 08/07/18  5:05 PM  Result Value Ref Range   Influenza A By PCR NEGATIVE NEGATIVE   Influenza B By PCR NEGATIVE NEGATIVE    Comment: (NOTE) The Xpert Xpress Flu assay is intended as an aid in the diagnosis of  influenza and should not be used as a sole basis for treatment.  This  assay is FDA approved for nasopharyngeal swab specimens only. Nasal  washings and aspirates are unacceptable for Xpert Xpress Flu testing. Performed at [facility], [address] [place], [place] [address]   Urinalysis, Complete w Microscopic     Status: Abnormal   Collection Time: 08/07/18  5:08 PM  Result Value Ref Range   Color, Urine YELLOW (A) YELLOW   APPearance CLOUDY (A) CLEAR   Specific Gravity, Urine 1.015 1.005 - 1.030   pH 6.0 5.0 - 8.0   Glucose, UA NEGATIVE NEGATIVE mg/dL   Hgb  urine dipstick NEGATIVE NEGATIVE   Bilirubin Urine NEGATIVE NEGATIVE   Ketones, ur 20 (A) NEGATIVE mg/dL   Protein, ur 784 (A) NEGATIVE mg/dL   Nitrite NEGATIVE NEGATIVE   Leukocytes,Ua LARGE (A) NEGATIVE   RBC / HPF 21-50 0 - 5 RBC/hpf   WBC, UA >50 (H) 0 - 5 WBC/hpf   Bacteria, UA RARE (A) NONE SEEN   Squamous Epithelial / LPF 0-5 0 - 5   WBC Clumps PRESENT    Mucus PRESENT     Comment: Performed at [facility], [address] [place], [place] [address]  Comprehensive metabolic panel     Status: Abnormal   Collection Time: 08/07/18  7:10 PM  Result Value Ref Range   Sodium 131 (L) 135 - 145 mmol/L   Potassium 3.1 (L) 3.5 - 5.1 mmol/L   Chloride 105 98 - 111 mmol/L   CO2 19 (L) 22 - 32 mmol/L   Glucose, Bld 107 (H) 70 - 99 mg/dL   BUN 6 6 -  20 mg/dL   Creatinine, Ser 5.28 0.44 - 1.00 mg/dL   Calcium 8.2 (L) 8.9 - 10.3 mg/dL   Total Protein 6.3 (L) 6.5 - 8.1 g/dL   Albumin 2.8 (L) 3.5 - 5.0 g/dL   AST 18 15 - 41 U/L   ALT 15 0 - 44 U/L   Alkaline Phosphatase 59 38 - 126 U/L   Total Bilirubin 0.6 0.3 - 1.2 mg/dL   GFR calc non Af Amer >60 >60 mL/min   GFR calc Af Amer >60 >60 mL/min   Anion gap 7 5 - 15    Comment: Performed at [facility], [address] [place], [place] [address]  CBC with Differential     Status: Abnormal   Collection Time: 08/07/18  7:10 PM  Result Value Ref Range   WBC 11.1 (H) 4.0 - 10.5 K/uL   RBC 3.72 (L) 3.87 - 5.11 MIL/uL   Hemoglobin 10.4 (L) 12.0 - 15.0 g/dL   HCT 40.1 (L) 02.7 - 25.3 %   MCV 85.2 80.0 - 100.0 fL   MCH 28.0 26.0 - 34.0 pg   MCHC 32.8 30.0 - 36.0 g/dL   RDW 66.4 40.3 - 47.4 %   Platelets 199 150 - 400 K/uL   nRBC 0.0 0.0 - 0.2 %   Neutrophils Relative % 86 %   Neutro Abs 9.5 (H) 1.7 - 7.7 K/uL   Lymphocytes Relative 5 %   Lymphs Abs 0.5 (L) 0.7 - 4.0 K/uL   Monocytes Relative 8 %   Monocytes Absolute 0.9 0.1 - 1.0 K/uL   Eosinophils Relative 0 %   Eosinophils Absolute 0.0 0.0 - 0.5 K/uL   Basophils Relative 0 %   Basophils Absolute 0.0 0.0 - 0.1 K/uL   Immature Granulocytes 1 %   Abs Immature Granulocytes 0.07 0.00 - 0.07 K/uL    Comment: Performed at [facility], [address] [place], [place] [address]  hCG, quantitative, pregnancy     Status: Abnormal   Collection Time: 08/07/18  7:11 PM  Result Value Ref Range   hCG, Beta Chain, Quant, S 22,075 (H) <5 mIU/mL    Comment:          GEST. AGE      CONC.  (mIU/mL)   <=1 WEEK        5 - 50     2 WEEKS       50 - 500  3 WEEKS       100 - 10,000     4 WEEKS     1,000 - 30,000     5 WEEKS     3,500 - 115,000   6-8 WEEKS     12,000 - 270,000    12 WEEKS     15,000 - 220,000        FEMALE AND NON-PREGNANT FEMALE:     LESS THAN 5 mIU/mL Performed at [facility], [address] [place], [place] [address]   Lactic acid, plasma     Status: None   Collection Time: 08/07/18  9:11 PM  Result Value Ref Range   Lactic Acid, Venous 1.0 0.5 - 1.9 mmol/L    Comment: Performed at [facility], [address] [place], [place] [address]  Chlamydia/NGC rt PCR     Status: None   Collection Time: 08/07/18  9:50 PM  Result Value Ref Range   Specimen source GC/Chlam ENDOCERVICAL    Chlamydia Tr NOT DETECTED NOT DETECTED   N gonorrhoeae NOT DETECTED NOT DETECTED    Comment: (NOTE) This CT/NG assay has not been evaluated in patients with a history of  hysterectomy. Performed at [facility], [address] [place], [place] [address]   Wet prep, genital     Status: Abnormal   Collection Time: 08/07/18  9:50 PM  Result Value Ref Range   Yeast Wet Prep HPF POC NONE SEEN NONE SEEN   Trich, Wet Prep NONE SEEN NONE SEEN   Clue Cells Wet Prep HPF POC NONE SEEN NONE SEEN   WBC, Wet Prep HPF POC RARE (A) NONE SEEN   Sperm NONE SEEN     Comment: Performed at [facility], [address], [place], [place] [address]   Mr Pelvis Wo Contrast  Result Date: 08/08/2018 CLINICAL DATA:  Fever body aches pregnant patient EXAM: MRI ABDOMEN AND PELVIS WITHOUT CONTRAST TECHNIQUE: Multiplanar multisequence MR imaging of the abdomen and pelvis was performed. No intravenous contrast was administered. Note that the examination was not tailored to evaluate the fetus or placenta. COMPARISON:  Ultrasound 08/07/2018 FINDINGS: COMBINED FINDINGS FOR BOTH MR ABDOMEN AND PELVIS Lower chest: Trace pleural effusions.  Normal heart size. Hepatobiliary: The liver is unremarkable. No calcified gallstones. No biliary dilatation. Pancreas: No mass, inflammatory changes, or other parenchymal abnormality identified. Spleen:  Within normal limits in size and appearance. Adrenals/Urinary Tract: Adrenal glands are normal. Mild enlargement of left extrarenal pelvis. Mild to moderate right  hydronephrosis. Focal slightly wedge-shaped hypointensity within the mid right kidney. Mild right perinephric edema/perinephric fluid. The bladder is unremarkable. Stomach/Bowel: No bowel wall thickening or edema. The appendix is not confidently identified however there is no right lower quadrant inflammatory process. Vascular/Lymphatic: Nonaneurysmal aorta. No grossly enlarged lymph nodes Reproductive: Single IUP.  Ovaries are unremarkable Other:  Free fluid in the pelvis, small amount. Musculoskeletal: No marrow edema IMPRESSION: 1. The appendix is not clearly identified however there is no right lower quadrant inflammatory process. 2. Moderate right hydronephrosis and hydroureter. There is mild right perinephric edema and fluid, raising possibility of pyelonephritis. Focal wedge-shaped area of altered intensity in the mid right kidney, question atypical appearance of pyelonephritis versus complex cyst versus infected lesion. Could attempt further characterisation with renal ultrasound. 3. Small amount of free fluid in the pelvis Electronically Signed   By: Jasmine Pang M.D.   On: 08/08/2018 01:44   Mr Abdomen Wo Contrast  Result Date: 08/08/2018 CLINICAL  DATA:  Fever body aches pregnant patient EXAM: MRI ABDOMEN AND PELVIS WITHOUT CONTRAST TECHNIQUE: Multiplanar multisequence MR imaging of the abdomen and pelvis was performed. No intravenous contrast was administered. Note that the examination was not tailored to evaluate the fetus or placenta. COMPARISON:  Ultrasound 08/07/2018 FINDINGS: COMBINED FINDINGS FOR BOTH MR ABDOMEN AND PELVIS Lower chest: Trace pleural effusions.  Normal heart size. Hepatobiliary: The liver is unremarkable. No calcified gallstones. No biliary dilatation. Pancreas: No mass, inflammatory changes, or other parenchymal abnormality identified. Spleen:  Within normal limits in size and appearance. Adrenals/Urinary Tract: Adrenal glands are normal. Mild enlargement of left extrarenal  pelvis. Mild to moderate right hydronephrosis. Focal slightly wedge-shaped hypointensity within the mid right kidney. Mild right perinephric edema/perinephric fluid. The bladder is unremarkable. Stomach/Bowel: No bowel wall thickening or edema. The appendix is not confidently identified however there is no right lower quadrant inflammatory process. Vascular/Lymphatic: Nonaneurysmal aorta. No grossly enlarged lymph nodes Reproductive: Single IUP.  Ovaries are unremarkable Other:  Free fluid in the pelvis, small amount. Musculoskeletal: No marrow edema IMPRESSION: 1. The appendix is not clearly identified however there is no right lower quadrant inflammatory process. 2. Moderate right hydronephrosis and hydroureter. There is mild right perinephric edema and fluid, raising possibility of pyelonephritis. Focal wedge-shaped area of altered intensity in the mid right kidney, question atypical appearance of pyelonephritis versus complex cyst versus infected lesion. Could attempt further characterisation with renal ultrasound. 3. Small amount of free fluid in the pelvis Electronically Signed   By: Jasmine PangKim  Fujinaga M.D.   On: 08/08/2018 01:44   [place] Ob Limited  Result Date: 08/07/2018 CLINICAL DATA:  Right lower quadrant pain. EXAM: LIMITED OBSTETRIC ULTRASOUND FINDINGS: Number of Fetuses: 1 Heart Rate:  165 bpm Movement: Visualized. Presentation: Cephalic Placental Location: Posterior Previa: Marginal Amniotic Fluid (Subjective):  Within normal limits. BPD: 3.9 cm 17 w  6 d MATERNAL FINDINGS: Cervix:  Appears closed. Uterus/Adnexae: No abnormality visualized. IMPRESSION: Single living intrauterine gestation at estimated 17 week 6 day gestational age by BPD. Marginal placenta previa without acute findings. This exam is performed on an emergent basis and does not comprehensively evaluate fetal size, dating, or anatomy; follow-up complete OB US should be considered if further fetal assessment is warranted. Electronically Signed    By: Kennith CenterEric  Mansell M.D.   On: 08/07/2018 21:12    Pending Labs Unresulted Labs (From admission, onward)    Start     Ordered   08/08/18 0304  Type and screen [facility]  ONCE - STAT,   STAT    Comments:  [facility]    08/08/18 0306   08/07/18 1911  Culture, blood (routine x 2)  BLOOD CULTURE X 2,   STAT     08/07/18 1914   08/07/18 1705  Urine culture  Once,   STAT    Question:  Patient immune status  Answer:  Normal   08/07/18 1704          Vitals/Pain Today's Vitals   08/07/18 2307 08/07/18 2330 08/08/18 0154 08/08/18 0155  BP:  100/62 114/67   Pulse:  93 97   Resp:  20 16   Temp:   98.4 F (36.9 C)   TempSrc:   Oral   SpO2:  99% 100%   Weight:    76 kg  Height:    5\' 4"  (1.626 m)  PainSc: 4   4      Isolation Precautions Droplet precaution  Medications Medications  acetaminophen (TYLENOL) tablet 650 mg (has no administration in time range)  docusate sodium (COLACE) capsule 100 mg (has no administration in time range)  calcium carbonate (TUMS - dosed in mg elemental calcium) chewable tablet 400 mg of elemental calcium (has no administration in time range)  prenatal multivitamin tablet 1 tablet (has no administration in time range)  0.9 %  sodium chloride infusion (has no administration in time range)  cefTRIAXone (ROCEPHIN) 1 g in sodium chloride 0.9 % 100 mL IVPB (1 g Intravenous Not Given 08/08/18 0323)  metoCLOPramide (REGLAN) injection 10 mg (has no administration in time range)  sodium chloride 0.9 % bolus 1,000 mL (0 mLs Intravenous Stopped 08/07/18 2142)  acetaminophen (TYLENOL) tablet 650 mg (650 mg Oral Given 08/07/18 2004)  acetaminophen (TYLENOL) tablet 500 mg (500 mg Oral Given 08/07/18 2149)  sodium chloride 0.9 % bolus 1,000 mL (0 mLs Intravenous Stopped 08/07/18 2353)  cefTRIAXone (ROCEPHIN) 1 g in sodium chloride 0.9 % 100 mL IVPB (0 g Intravenous Stopped 08/07/18 2307)    Mobility walks

## 2018-08-08 NOTE — ED Notes (Signed)
Dr. Brown at bedside

## 2018-08-09 LAB — CBC
HCT: 26.2 % — ABNORMAL LOW (ref 36.0–46.0)
Hemoglobin: 8.5 g/dL — ABNORMAL LOW (ref 12.0–15.0)
MCH: 28.2 pg (ref 26.0–34.0)
MCHC: 32.4 g/dL (ref 30.0–36.0)
MCV: 87 fL (ref 80.0–100.0)
Platelets: 152 10*3/uL (ref 150–400)
RBC: 3.01 MIL/uL — ABNORMAL LOW (ref 3.87–5.11)
RDW: 13.9 % (ref 11.5–15.5)
WBC: 10.8 10*3/uL — ABNORMAL HIGH (ref 4.0–10.5)
nRBC: 0 % (ref 0.0–0.2)

## 2018-08-09 NOTE — Plan of Care (Signed)
Patient's vital signs stable; pain controlled with po tylenol; incentive spirometry given and patient demonstrated use (1250); good appetite; good po fluids; voiding; showered sitting in shower chair with husband assist; feels fetal movement; FHT 150; no vaginal bleeding; IV fluids continued; IV site clear.

## 2018-08-09 NOTE — Progress Notes (Signed)
Daily Antepartum Note  Admission Date: 08/07/2018 Current Date: 08/09/2018 9:57 AM  Molly Graves is a 28 y.o. [id number] @ [redacted]w[redacted]d admitted for right pyelonephritis and found to have Enterobacter and E.coli sepsis.   Patient Active Problem List   Diagnosis Date Noted  . Pyelonephritis affecting pregnancy in second trimester 08/08/2018  . Supervision of other normal pregnancy, antepartum 05/22/2018  . Complication of intrauterine device (IUD) (HCC) 04/19/2016    Overnight/24hr events:  Afebrile since 1940 last night. Decreasing WBC with AM labs.  Subjective:  States that she is feeling "slightly better" this morning. Was able to shower last night. Pain is about the same. Eating breakfast, no nausea currently.   Objective:   Vitals:   08/09/18 0402 08/09/18 0825  BP: (!) 91/55 (!) 88/57  Pulse: 94 83  Resp: 20 18  Temp: 98.7 F (37.1 C) 98.4 F (36.9 C)  SpO2: 100%    Temp:  [98 F (36.7 C)-102.9 F (39.4 C)] 98.4 F (36.9 C) (02/23 0825) Pulse Rate:  [76-107] 83 (02/23 0825) Resp:  [16-20] 18 (02/23 0825) BP: (88-102)/(52-61) 88/57 (02/23 0825) SpO2:  [98 %-100 %] 100 % (02/23 0402) Temp (24hrs), Avg:100 F (37.8 C), Min:98 F (36.7 C), Max:102.9 F (39.4 C)   Intake/Output Summary (Last 24 hours) at 08/09/2018 0957 Last data filed at 08/09/2018 0537 Gross per 24 hour  Intake 2381.11 ml  Output 1675 ml  Net 706.11 ml     Current Vital Signs 24h Vital Sign Ranges  T 98.4 F (36.9 C) Temp  Avg: 100 F (37.8 C)  Min: 98 F (36.7 C)  Max: 102.9 F (39.4 C)  BP (!) 88/57 BP  Min: 88/57  Max: 102/61  HR 83 Pulse  Avg: 91.3  Min: 76  Max: 107  RR 18 Resp  Avg: 18.9  Min: 16  Max: 20  SaO2 100 % Room Air SpO2  Avg: 99.3 %  Min: 98 %  Max: 100 %       24 Hour I/O Current Shift I/O  Time Ins Outs 02/22 0701 - 02/23 0700 In: 2881.1 [P.O.:360; I.V.:2221.7] Out: 1675 [Urine:1675] No intake/output data recorded.   Patient Vitals for the past 24 hrs:  BP Temp Temp  src Pulse Resp SpO2  08/09/18 0825 (!) 88/57 98.4 F (36.9 C) Oral 83 18 -  08/09/18 0402 (!) 91/55 98.7 F (37.1 C) Oral 94 20 100 %  08/09/18 0009 (!) 89/59 98 F (36.7 C) Oral 76 16 100 %  08/08/18 1939 (!) 93/55 98.6 F (37 C) Oral 81 18 99 %  08/08/18 1800 - (!) 102.4 F (39.1 C) Axillary - - -  08/08/18 1656 - (!) 102.2 F (39 C) Axillary - - -  08/08/18 1601 102/61 99.4 F (37.4 C) Oral (!) 107 20 98 %  08/08/18 1339 (!) 98/54 98.5 F (36.9 C) Oral 98 20 100 %  08/08/18 1245 - (!) 100.5 F (38.1 C) - - - -  08/08/18 1133 (!) 100/52 (!) 102.9 F (39.4 C) - 100 20 99 %    Physical exam: General: Well nourished, well developed female in no acute distress. Abdomen: Mild tenderness on right side Cardiovascular: Regular rate Respiratory: No increased work of breathing Extremities: no clubbing, cyanosis or edema Skin: Warm and dry Neuro: Alert and oriented  Medications: Current Facility-Administered Medications  Medication Dose Route Frequency Provider Last Rate Last Dose  . 0.9 %  sodium chloride infusion   Intravenous Continuous Schuman, Christanna R,  MD 125 mL/hr at 08/09/18 0534    . acetaminophen (TYLENOL) tablet 1,000 mg  1,000 mg Oral Q6H Schuman, Christanna R, MD   1,000 mg at 08/09/18 0532  . calcium carbonate (TUMS - dosed in mg elemental calcium) chewable tablet 400 mg of elemental calcium  2 tablet Oral Q4H PRN Farrel Conners, CNM      . meropenem (MERREM) 500 mg in sodium chloride 0.9 % 100 mL IVPB  500 mg Intravenous Q8H Schuman, Christanna R, MD 200 mL/hr at 08/09/18 0537 500 mg at 08/09/18 0537  . ondansetron (ZOFRAN) injection 4 mg  4 mg Intravenous Q4H PRN Farrel Conners, CNM   4 mg at 08/08/18 1759  . oxyCODONE (Oxy IR/ROXICODONE) immediate release tablet 5 mg  5 mg Oral Q4H PRN Farrel Conners, CNM   5 mg at 08/08/18 1652  . prenatal multivitamin tablet 1 tablet  1 tablet Oral Q1200 Farrel Conners, CNM   1 tablet at 08/08/18 1410    Labs:   Recent Labs  Lab 08/08/18 1301 08/08/18 1948 08/09/18 0635  WBC 14.5* 13.8* 10.8*  HGB 8.9* 8.6* 8.5*  HCT 27.4* 26.7* 26.2*  PLT 178 172 152    Recent Labs  Lab 08/07/18 1910 08/08/18 1948  NA 131* 132*  K 3.1* 3.6  CL 105 110  CO2 19* 18*  BUN 6  --   CREATININE 0.60  --   CALCIUM 8.2*  --   PROT 6.3*  --   BILITOT 0.6  --   ALKPHOS 59  --   ALT 15  --   AST 18  --   GLUCOSE 107*  --     Assessment & Plan:  28 year old G5P2012 at [redacted]w[redacted]d with pyelonephritis and sepsis.  1) Continue meropenem 500 mg every 8 hours 2) Tylenol 1g every 6 hours; PRN medication for pain/nausea 3) Continue NS 125 ml/h and intake and output measurements 4) Regular diet 5) Doppler FHT daily 6) May shower as desired.  Marcelyn Bruins, CNM 08/09/2018

## 2018-08-10 ENCOUNTER — Inpatient Hospital Stay: Payer: Medicaid Other

## 2018-08-10 LAB — CBC
HCT: 28.9 % — ABNORMAL LOW (ref 36.0–46.0)
Hemoglobin: 9.1 g/dL — ABNORMAL LOW (ref 12.0–15.0)
MCH: 27.5 pg (ref 26.0–34.0)
MCHC: 31.5 g/dL (ref 30.0–36.0)
MCV: 87.3 fL (ref 80.0–100.0)
Platelets: 208 10*3/uL (ref 150–400)
RBC: 3.31 MIL/uL — ABNORMAL LOW (ref 3.87–5.11)
RDW: 13.8 % (ref 11.5–15.5)
WBC: 9.1 10*3/uL (ref 4.0–10.5)
nRBC: 0 % (ref 0.0–0.2)

## 2018-08-10 LAB — URINE CULTURE
Culture: >=100000 — AB
SPECIAL REQUESTS: NORMAL

## 2018-08-10 LAB — CULTURE, BLOOD (ROUTINE X 2): Special Requests: ADEQUATE

## 2018-08-10 MED ORDER — DOCUSATE SODIUM 100 MG PO CAPS
100.0000 mg | ORAL_CAPSULE | Freq: Every day | ORAL | Status: DC | PRN
  Administered 2018-08-10: 100 mg via ORAL
  Filled 2018-08-10: qty 1

## 2018-08-10 MED ORDER — SODIUM CHLORIDE 0.9 % IV SOLN
2.0000 g | INTRAVENOUS | Status: DC
  Administered 2018-08-10: 2 g via INTRAVENOUS
  Filled 2018-08-10: qty 2
  Filled 2018-08-10: qty 20

## 2018-08-10 MED ORDER — ACETAMINOPHEN 500 MG PO TABS
1000.0000 mg | ORAL_TABLET | Freq: Four times a day (QID) | ORAL | Status: DC | PRN
  Administered 2018-08-11: 1000 mg via ORAL
  Filled 2018-08-10: qty 2

## 2018-08-10 MED ORDER — BUTALBITAL-APAP-CAFFEINE 50-325-40 MG PO TABS
2.0000 | ORAL_TABLET | Freq: Four times a day (QID) | ORAL | Status: DC | PRN
  Administered 2018-08-10 – 2018-08-11 (×3): 2 via ORAL
  Filled 2018-08-10 (×5): qty 2

## 2018-08-10 MED ORDER — FERROUS FUMARATE 324 (106 FE) MG PO TABS
1.0000 | ORAL_TABLET | Freq: Every day | ORAL | Status: DC
  Administered 2018-08-10: 106 mg via ORAL
  Filled 2018-08-10 (×4): qty 1

## 2018-08-10 NOTE — Progress Notes (Signed)
Admission Date: 08/07/2018 Current Date: 08/10/2018   Molly Graves is a 28 y.o. [id number] @ [redacted] weeks gestation admitted for right pyelonephritis and found to have E.coli sepsis.       Patient Active Problem List   Diagnosis Date Noted  . Pyelonephritis affecting pregnancy in second trimester 08/08/2018  . Supervision of other normal pregnancy, antepartum 05/22/2018  . Complication of intrauterine device (IUD) (HCC) 04/19/2016    Overnight/24hr events:  Afebrile x 36 hours. Had chills last night, but no fever.   Subjective:  Tolerating regular diet. No BM since admission. Feeling a little better. Back pain slowly improving, and taking only Tylenol for pain. Has had frontal headache since admission. Denies sinus congestion, rhinorrhea, sore throat. Headache improved with Tylenol, but never goes away. Objective:  Vital signs: BP 96/60 (BP Location: Left Arm)   Pulse 64   Temp 97.8 F (36.6 C) (Oral)   Resp 18   Ht 5\' 4"  (1.626 m)   Wt 76 kg   LMP 04/06/2018 (Exact Date)   SpO2 99%   BMI 28.76 kg/m  Patient Vitals for the past 24 hrs:  BP Temp Temp src Pulse Resp SpO2  08/10/18 1134 96/60 97.8 F (36.6 C) Oral 64 18 -  08/10/18 0815 97/63 97.8 F (36.6 C) Oral 67 18 99 %  08/10/18 0330 107/62 98.5 F (36.9 C) Oral 92 20 100 %  08/10/18 0000 (!) 94/57 98.4 F (36.9 C) Oral 86 20 100 %  08/09/18 2030 - 98.7 F (37.1 C) Oral - - -  08/09/18 1930 106/67 99.1 F (37.3 C) Oral 91 20 100 %  08/09/18 1643 (!) 89/60 98.4 F (36.9 C) Oral 86 20 100 %  08/09/18 1424 - 98.1 F (36.7 C) Oral - - -  08/09/18 1420 - 97.9 F (36.6 C) Oral - - -  08/09/18 1330 - 99.6 F (37.6 C) Oral - - -    Intake/Output Summary (Last 24 hours) at 08/10/2018 1311 Last data filed at 08/10/2018 1134 Gross per 24 hour  Intake 3876.37 ml  Output 3000 ml  Net 876.37 ml   Physical exam: General: Well nourished, well developed female in no acute distress. Abdomen: soft, gravid, FHT  140 Cardiovascular: RRR without murmur Respiratory: No increased work of breathing/ CTAB Back: mild CVAT on right Extremities: no clubbing, cyanosis or edema Skin: Warm and dry Neuro: Alert and oriented  Medications:          Current Facility-Administered Medications  Medication Dose Route Frequency Provider Last Rate Last Dose  . 0.9 %  sodium chloride infusion   Intravenous Continuous Natale Milch, MD 125 mL/hr at 08/09/18 0534    . acetaminophen (TYLENOL) tablet 1,000 mg  1,000 mg Oral Q6H Schuman, Christanna R, MD   1,000 mg at 08/09/18 0532  . calcium carbonate (TUMS - dosed in mg elemental calcium) chewable tablet 400 mg of elemental calcium  2 tablet Oral Q4H PRN Farrel Conners, CNM      . meropenem (MERREM) 500 mg in sodium chloride 0.9 % 100 mL IVPB  500 mg Intravenous Q8H Schuman, Christanna R, MD 200 mL/hr at 08/09/18 0537 500 mg at 08/09/18 0537  . ondansetron (ZOFRAN) injection 4 mg  4 mg Intravenous Q4H PRN Farrel Conners, CNM   4 mg at 08/08/18 1759  . oxyCODONE (Oxy IR/ROXICODONE) immediate release tablet 5 mg  5 mg Oral Q4H PRN Farrel Conners, CNM   5 mg at 08/08/18 1652  . prenatal multivitamin  tablet 1 tablet  1 tablet Oral Q1200 Farrel Conners, CNM   1 tablet at 08/08/18 1410    CBC Latest Ref Rng & Units 08/10/2018 08/09/2018 08/08/2018  WBC 4.0 - 10.5 K/uL 9.1 10.8(H) 13.8(H)  Hemoglobin 12.0 - 15.0 g/dL 3.2(Z) 2.2(Q) 8.2(N)  Hematocrit 36.0 - 46.0 % 28.9(L) 26.2(L) 26.7(L)  Platelets 150 - 400 K/uL 208 152 172   LastLabs      Recent Labs  Lab 08/07/18 1910 08/08/18 1948  NA 131* 132*  K 3.1* 3.6  CL 105 110  CO2 19* 18*  BUN 6  --   CREATININE 0.60  --   CALCIUM 8.2*  --   PROT 6.3*  --   BILITOT 0.6  --   ALKPHOS 59  --   ALT 15  --   AST 18  --   GLUCOSE 107*  --       (important suggestion)  Newer results are available. Click to view them now.  Component 3d ago  Specimen Description URINE, RANDOM  Performed at  [facility], [address] [place], [place] [address]   Special Requests Normal  Performed at [facility], [address] [place], [place] [address]   Culture >=100,000 COLONIES/mL ESCHERICHIA COLIAbnormal    Report Status 08/10/2018 FINAL   Organism ID, Bacteria ESCHERICHIA COLIAbnormal    Resulting Agency [facility]  Susceptibility    Escherichia coli    MIC    AMPICILLIN <=2 SENSITIVE  Sensitive    AMPICILLIN/SULBACTAM <=2 SENSITIVE  Sensitive    CEFAZOLIN <=4 SENSITIVE  Sensitive    CEFTRIAXONE <=1 SENSITIVE  Sensitive    CIPROFLOXACIN <=0.25 SENS... Sensitive    Extended ESBL NEGATIVE  Sensitive    GENTAMICIN <=1 SENSITIVE  Sensitive    IMIPENEM <=0.25 SENS... Sensitive    NITROFURANTOIN <=16 SENSIT... Sensitive    PIP/TAZO <=4 SENSITIVE  Sensitive    TRIMETH/SULFA <=20 SENSIT... Sensitive         Susceptibility Comments   Escherichia coli Blood culture  >=100,000 COLONIES/mL ESCHERICHIA COLI      Specimen Collected: 08/07/18 17:08     Escherichia coli NOT DETECTED DETECTEDAbnormal    Comment: CRITICAL RESULT CALLED TO, READ BACK BY AND VERIFIED WITH:  SHEEMA HALLAJI AT 1694 08/08/18 SDR   Klebsiella oxytoca NOT DETECTED NOT DETECTED   Klebsiella pneumoniae NOT DETECTED NOT DETECTED   Proteus species NOT DETECTED NOT DETECTED   Serratia marcescens NOT DETECTED NOT DETECTED   Carbapenem resistance NOT DETECTED NOT DETECTED   Haemophilus influenzae NOT DETECTED NOT DETECTED   Neisseria meningitidis NOT DETECTED NOT DETECTED   Pseudomonas aeruginosa NOT DETECTED NOT DETECTED   Candida albicans NOT DETECTED NOT DETECTED   Candida glabrata NOT DETECTED NOT DETECTED   Candida krusei NOT DETECTED NOT DETECTED   Candida parapsilosis NOT DETECTED NOT DETECTED   Candida tropicalis NOT DETECTED NOT DETECTED   Comment: Performed at [facility], [address], [place], [place] [address]  Resulting Agency  [facility]      Specimen  Collected: 08/07/18 19:22 Last Resulted: 08/08/18 07:42       MRI IMPRESSION from 08/08/18 1. The appendix is not clearly identified however there is no right lower quadrant inflammatory process. 2. Moderate right hydronephrosis and hydroureter. There is mild right perinephric edema and fluid, raising possibility of pyelonephritis. Focal wedge-shaped area of altered intensity in the mid right kidney, question atypical appearance of pyelonephritis versus complex cyst versus infected lesion.  Could attempt further characterisation with renal ultrasound.  Assessment & Plan:  28 year old [id number] at [redacted]w[redacted]d with E. Coli pyelonephritis and sepsis. Headache possibly related to sepsis and or anemia  1) Discussed antibiotics with pharmacist Dahlia Client and Dr Anne Shutter. Coli pansensitive, will change antibiotics back to Rocephin 2gm every 24 hours. If remains afebrile for 48 hours, can switch to oral antibiotics in preparation for discharge. Kidney ultrasound ordered 2) Tylenol 1g every 6 hours prn. Will try Fioricet for headache 3) Decrease IV to 50 ml/hr. Continue I&O 4) Regular diet 5) Doppler FHT daily 6) May shower as desired.  Farrel Conners, CNM

## 2018-08-11 MED ORDER — CEPHALEXIN 500 MG PO CAPS: 500.0000 mg | ORAL_CAPSULE | Freq: Four times a day (QID) | ORAL | 0 refills | Status: DC

## 2018-08-11 MED ORDER — NITROFURANTOIN MONOHYD MACRO 100 MG PO CAPS: 100.0000 mg | ORAL_CAPSULE | Freq: Every day | ORAL | 4 refills | Status: DC

## 2018-08-11 MED ORDER — OXYCODONE HCL 5 MG PO TABS: 5.0000 mg | ORAL_TABLET | ORAL | 0 refills | Status: DC | PRN

## 2018-08-11 MED ORDER — FLUCONAZOLE 100 MG PO TABS
150.0000 mg | ORAL_TABLET | Freq: Once | ORAL | Status: AC
  Administered 2018-08-11: 150 mg via ORAL
  Filled 2018-08-11 (×2): qty 1.5

## 2018-08-11 MED ORDER — CEPHALEXIN 500 MG PO CAPS
500.0000 mg | ORAL_CAPSULE | Freq: Four times a day (QID) | ORAL | Status: DC
  Filled 2018-08-11: qty 1

## 2018-08-11 MED ORDER — CEPHALEXIN 500 MG PO CAPS
500.0000 mg | ORAL_CAPSULE | Freq: Four times a day (QID) | ORAL | Status: DC
  Administered 2018-08-11: 500 mg via ORAL
  Filled 2018-08-11 (×2): qty 1

## 2018-08-11 NOTE — Discharge Summary (Signed)
Physician Discharge Summary  Patient ID: Molly Graves MRN: [id number] DOB/AGE: Aug 01, 1990 28 y.o.  Admit date: 08/07/2018 Discharge date: 08/11/2018  Admission Diagnoses: Pain, 18 weeks pregnancy, UTI  Discharge Diagnoses:  Principal Problem:   Pyelonephritis affecting pregnancy in second trimester   18 weeks pregnancy  Discharged Condition: good  Hospital Course: She presents to L&D from the ER with a diagnosis of pyelonephritis after presenting with body aches, malaise, dysuria, chills, and urinary frequency. She spiked a fever to 101.2 and was tachycardic with pulse up to  130 BPM. WBC 11.1 and urinalysis remarkable for large amount of leukocytes, +2 proteinuria, >50 WBC/hpf and rare bacteria. Intravenous hydration was started and she received Rocephin IV. Culture pos for E Coli.  Meropenam ABX next.  Then Keflex.  Afebrile and without kidney pain for >48 hours.  Outpatient management discussed.    Consults: None  Significant Diagnostic Studies: Fetal monitoring normal.  Treatments: IV hydration and antibiotics  Discharge Exam: Blood pressure (!) 84/48, pulse 64, temperature 97.9 F (36.6 C), temperature source Oral, resp. rate 18, height 5\' 4"  (1.626 m), weight 76 kg, last menstrual period 04/06/2018, SpO2 100 %. General appearance: alert, cooperative and no distress GI: soft, non-tender; bowel sounds normal; no masses,  no organomegaly Extremities: extremities normal, atraumatic, no cyanosis or edema Skin: Skin color, texture, turgor normal. No rashes or lesions Neurologic: Grossly normal Back: No CVAT nor flank T, mostly lower MS T to palpation  Disposition: Discharge disposition: 01-Home or Self Care       Discharge Instructions    Call MD for:   Complete by:  As directed    Worsening contractions or pain; leakage of fluid; bleeding.   Diet general   Complete by:  As directed    Increase activity slowly   Complete by:  As directed      Allergies as of  08/11/2018   No Known Allergies     Medication List    TAKE these medications   acetaminophen 500 MG tablet Commonly known as:  TYLENOL Take 500 mg by mouth every 6 (six) hours as needed for mild pain, moderate pain, fever or headache.   cephALEXin 500 MG capsule Commonly known as:  KEFLEX Take 1 capsule (500 mg total) by mouth every 6 (six) hours.   Doxylamine-Pyridoxine 10-10 MG Tbec Commonly known as:  DICLEGIS Take 2 tablets by mouth at bedtime. If symptoms persist, add one tablet in the morning and one in the afternoon   nitrofurantoin (macrocrystal-monohydrate) 100 MG capsule Commonly known as:  MACROBID Take 1 capsule (100 mg total) by mouth at bedtime. Suppression for UTI rest of pregnancy   oxyCODONE 5 MG immediate release tablet Commonly known as:  Oxy IR/ROXICODONE Take 1 tablet (5 mg total) by mouth every 4 (four) hours as needed for moderate pain or severe pain.   PROVIDA DHA 16-16-1.25-110 MG Caps Take 1 capsule by mouth daily.      Follow-up Information    Nadara Mustard, MD. Go in 2 week(s).   Specialty:  Obstetrics and Gynecology Why:  Keep Mar 11 appt Contact information: [address] [place] [address] [phone]           Signed: Letitia Libra 08/11/2018, 4:01 PM

## 2018-08-11 NOTE — Progress Notes (Signed)
Daily Antepartum Note  Admission Date: 08/07/2018 Current Date: 08/11/2018 9:33 AM  Molly Graves is a 28 y.o. [id number] @ [redacted]w[redacted]d admitted for right pyelonephritis and found to have Enterobacter and E. Coli sepsis.   Patient Active Problem List   Diagnosis Date Noted  . Pyelonephritis affecting pregnancy in second trimester 08/08/2018  . Supervision of other normal pregnancy, antepartum 05/22/2018  . Complication of intrauterine device (IUD) (HCC) 04/19/2016    Overnight/24hr events:  Afebrile since 2/22 at 1800. Has a thick white vaginal discharge now.  Subjective:  Feeling much better. Has not had any more chills.  Appetite improved; eating more food. States that she is ready to go home when she can.  Objective:   Vitals:   08/11/18 0310 08/11/18 0743  BP:  (!) 95/54  Pulse:  74  Resp:  18  Temp: 98.8 F (37.1 C) 98.5 F (36.9 C)  SpO2:  100%   Temp:  [97.8 F (36.6 C)-98.8 F (37.1 C)] 98.5 F (36.9 C) (02/25 0743) Pulse Rate:  [64-94] 74 (02/25 0743) Resp:  [18] 18 (02/25 0743) BP: (95-105)/(54-70) 95/54 (02/25 0743) SpO2:  [100 %] 100 % (02/25 0743) Temp (24hrs), Avg:98.3 F (36.8 C), Min:97.8 F (36.6 C), Max:98.8 F (37.1 C)   Intake/Output Summary (Last 24 hours) at 08/11/2018 0933 Last data filed at 08/10/2018 2333 Gross per 24 hour  Intake 992.45 ml  Output 1800 ml  Net -807.55 ml     Current Vital Signs 24h Vital Sign Ranges  T 98.5 F (36.9 C) Temp  Avg: 98.3 F (36.8 C)  Min: 97.8 F (36.6 C)  Max: 98.8 F (37.1 C)  BP (!) 95/54(nurse Robin Smith notified) BP  Min: 95/54  Max: 105/66  HR 74 Pulse  Avg: 79  Min: 64  Max: 94  RR 18 Resp  Avg: 18  Min: 18  Max: 18  SaO2 100 % Room Air SpO2  Avg: 100 %  Min: 100 %  Max: 100 %       24 Hour I/O Current Shift I/O  Time Ins Outs 02/24 0701 - 02/25 0700 In: 1528.9 [I.V.:1428.9] Out: 2100 [Urine:2100] No intake/output data recorded.   Patient Vitals for the past 24 hrs:  BP Temp Temp src Pulse  Resp SpO2  08/11/18 0743 (!) 95/54 98.5 F (36.9 C) Oral 74 18 100 %  08/11/18 0310 - 98.8 F (37.1 C) Oral - - -  08/10/18 2328 96/61 98.2 F (36.8 C) Oral 94 18 100 %  08/10/18 1933 105/66 98.3 F (36.8 C) Oral 81 18 100 %  08/10/18 1632 100/70 98.4 F (36.9 C) Oral 82 18 -  08/10/18 1134 96/60 97.8 F (36.6 C) Oral 64 18 -    Physical exam: General: Well nourished, well developed female in no acute distress. Abdomen: gravid, non-tender Cardiovascular: Regular rate Respiratory: No increased work of breathing Extremities: no clubbing, cyanosis or edema Skin: Warm and dry.  Neuro: Alert and oriented  Medications: Current Facility-Administered Medications  Medication Dose Route Frequency Provider Last Rate Last Dose  . acetaminophen (TYLENOL) tablet 1,000 mg  1,000 mg Oral Q6H PRN Farrel Conners, CNM   1,000 mg at 08/11/18 0650  . butalbital-acetaminophen-caffeine (FIORICET, ESGIC) 50-325-40 MG per tablet 2 tablet  2 tablet Oral Q6H PRN Farrel Conners, CNM   2 tablet at 08/10/18 2005  . calcium carbonate (TUMS - dosed in mg elemental calcium) chewable tablet 400 mg of elemental calcium  2 tablet Oral Q4H PRN Sharen Hones,  Colleen, CNM   400 mg of elemental calcium at 08/09/18 2338  . cephALEXin (KEFLEX) capsule 500 mg  500 mg Oral Q6H Oswaldo Conroy, CNM      . docusate sodium (COLACE) capsule 100 mg  100 mg Oral Daily PRN Farrel Conners, CNM   100 mg at 08/10/18 1347  . Ferrous Fumarate (HEMOCYTE - 106 mg FE) tablet 106 mg of iron  1 tablet Oral Daily Farrel Conners, CNM   106 mg of iron at 08/10/18 1347  . fluconazole (DIFLUCAN) tablet 150 mg  150 mg Oral Once Oswaldo Conroy, CNM      . ondansetron (ZOFRAN) injection 4 mg  4 mg Intravenous Q4H PRN Farrel Conners, CNM   4 mg at 08/10/18 2330  . oxyCODONE (Oxy IR/ROXICODONE) immediate release tablet 5 mg  5 mg Oral Q4H PRN Farrel Conners, CNM   5 mg at 08/11/18 3244  . prenatal multivitamin tablet 1  tablet  1 tablet Oral Q1200 Farrel Conners, CNM   1 tablet at 08/10/18 1132    Labs:  Recent Labs  Lab 08/08/18 1948 08/09/18 0635 08/10/18 0555  WBC 13.8* 10.8* 9.1  HGB 8.6* 8.5* 9.1*  HCT 26.7* 26.2* 28.9*  PLT 172 152 208    Recent Labs  Lab 08/07/18 1910 08/08/18 1948  NA 131* 132*  K 3.1* 3.6  CL 105 110  CO2 19* 18*  BUN 6  --   CREATININE 0.60  --   CALCIUM 8.2*  --   PROT 6.3*  --   BILITOT 0.6  --   ALKPHOS 59  --   ALT 15  --   AST 18  --   GLUCOSE 107*  --      Radiology: Renal ultrasound 2/24 IMPRESSION: 1. Persistent mild hydronephrosis on the right. 2. Mild prominence of the left renal pelvis is stable since the MRI from August 08, 2018. No caliectasis. 3. No other abnormalities.  Assessment & Plan:  28 year old [id number] with pyelonephritis and sepsis, recovering well.  1) Discontinue oral antibiotics and begin oral agent. Keflex 500 mg QID. Plan to continue at home for 7 days  2) Diflucan once to treat symptoms of yeast infection  3) Regular diet, discontinue IV fluids  4) Fetal heart tones daily  5) Pain medication PRN  6) Consider discharge later today if she remains afebrile and well, Macrobid suppressive therapy with Keflex on discharge.  Marcelyn Bruins, CNM 08/11/2018

## 2018-08-11 NOTE — Discharge Instructions (Signed)
Pyelonephritis During Pregnancy ° °What are the causes? °This condition is caused by a bacterial infection in the lower urinary tract that spreads to the kidney. °What increases the risk? °You are more likely to develop this condition if: °· You have diabetes. °· You have a history of frequent urinary tract infections. °What are the signs or symptoms? °Symptoms of this condition may begin with symptoms of a lower urinary tract infection. These may include: °· A frequent urge to pass urine. °· Burning pain when passing urine. °· Pain and pressure in your lower abdomen. °· Blood in your urine. °· Cloudy or smelly urine. °As the infection spreads to your kidney, you may have these symptoms: °· Fever. °· Chills. °· Pain and tenderness in your upper abdomen or in your back and sides (flank pain). Flank pain often affects one side of the body, usually the right side. °· Nausea, vomiting, or loss of appetite. °How is this diagnosed? °This condition may be diagnosed based on: °· Your symptoms and medical history. °· A physical exam. °· Tests to confirm the diagnosis. These may include: °? Blood tests to check kidney function and look for signs of infection. °? Urine tests to check for signs of infection, including bacteria, white or red blood cells, and protein. °? Tests to grow and identify the type of bacteria that is causing the infection (urine culture). °? Imaging studies of your kidneys to learn more about your condition. °How is this treated? °This condition is treated in the hospital with antibiotics that are given into one of your veins through an IV. Your health care provider: °· Will start you on an antibiotic that is effective against common urinary tract infections. °· May switch to another antibiotic if the results of your urine culture show that your infection is caused by different bacteria. °· Will be careful to choose antibiotics that are the safest during pregnancy. °Other treatments may include: °· IV  fluids if you are nauseous and not able to drink fluids. °· Pain and fever medicines. °· Medicines for nausea and vomiting. °You will be able to go home when your infection is under control. To prevent another infection, you may need to continue taking antibiotics by mouth until your baby is born. °Follow these instructions at home: °Medicines ° °· Take over-the-counter and prescription medicines only as told by your health care provider. °· Take your antibiotic medicine as told by your health care provider. Do not stop taking the antibiotic even if you start to feel better. °· Continue to take your prenatal vitamins. °Lifestyle ° °· Follow instructions from your health care provider about eating or drinking restrictions. You may need to avoid: °? Foods and drinks with added sugar. °? Caffeine and fruit juice. °· Drink enough fluid to keep your urine pale yellow. °· Go to the bathroom frequently. Do not hold your urine. Try to empty your bladder completely. °· Change your underwear every day. Wear all-cotton underwear. Do not wear tight underwear or pants. °General instructions ° °· Take these steps to lower the risk of bacteria getting into your urinary tract: °? Use liquid soap instead of bar soap when showering or bathing. Bacteria can grow on bar soap. °? When you wash yourself, clean the urethra opening first. Use a washcloth to clean the area between your vagina and anus. Pat the area dry with a clean towel. °? Wash your hands before and after you go to the bathroom. °? Wipe yourself from front to back   after going to the bathroom. °? Do not use douches, perfumed soap, creams, or powders. °? Do not soak in a bath for more than 30 minutes. °· Return to your normal activities as told by your health care provider. Ask your health care provider what activities are safe for you. °· Keep all follow-up visits as told by your health care provider. This is important. °Contact a health care provider if: °· You have  chills or a fever. °· You have any symptoms of infection that do not get better at home. °· Symptoms of infection come back. °· You have a reaction or side effects from your antibiotic. °Get help right away if: °· You start having contractions. °Summary °· Pyelonephritis is an infection of the kidney or kidneys. °· This condition results when a bacterial infection in the lower urinary tract spreads to the kidney. °· Lower urinary tract infections are common during pregnancy. °· Pyelonephritis causes chills, a fever, flank pain, and nausea. °· Pyelonephritis is a serious infection that is usually treated in the hospital with IV antibiotics. °This information is not intended to replace advice given to you by your health care provider. Make sure you discuss any questions you have with your health care provider. °Document Released: 09/04/2017 Document Revised: 09/04/2017 Document Reviewed: 09/04/2017 °Elsevier Interactive Patient Education © 2019 [employer] ° °

## 2018-08-11 NOTE — Progress Notes (Signed)
Dc to home.  To car via wc with staff.   

## 2018-08-11 NOTE — Progress Notes (Signed)
Dc instr reviewed with pt.  Verb u/o of how to take antibiotic and then how to take the suppressant antibiotic.

## 2018-08-12 LAB — CULTURE, BLOOD (ROUTINE X 2)
Culture: NO GROWTH
SPECIAL REQUESTS: ADEQUATE

## 2018-08-13 ENCOUNTER — Telehealth: Payer: Self-pay

## 2018-08-13 NOTE — Telephone Encounter (Signed)
Pt was d/c'd from hospital on Tues.  Has a really bad sore throat and yeast inf.  Nothing else going on.  She told the nurses at the hosp  They gave her something for the yeast inf but it didn't help.  [phone]

## 2018-08-17 ENCOUNTER — Encounter: Payer: Self-pay | Admitting: Obstetrics & Gynecology

## 2018-08-17 NOTE — Telephone Encounter (Signed)
This encounter was created in error - please disregard.

## 2018-08-18 ENCOUNTER — Other Ambulatory Visit: Payer: Self-pay | Admitting: Obstetrics & Gynecology

## 2018-08-26 ENCOUNTER — Ambulatory Visit (INDEPENDENT_AMBULATORY_CARE_PROVIDER_SITE_OTHER): Payer: Medicaid Other | Admitting: Obstetrics and Gynecology

## 2018-08-26 ENCOUNTER — Encounter: Payer: Self-pay | Admitting: Obstetrics and Gynecology

## 2018-08-26 ENCOUNTER — Other Ambulatory Visit: Payer: Self-pay

## 2018-08-26 ENCOUNTER — Ambulatory Visit (INDEPENDENT_AMBULATORY_CARE_PROVIDER_SITE_OTHER): Payer: Medicaid Other

## 2018-08-26 VITALS — BP 110/60 | Wt 170.0 lb

## 2018-08-26 DIAGNOSIS — Z3480 Encounter for supervision of other normal pregnancy, unspecified trimester: Secondary | ICD-10-CM

## 2018-08-26 DIAGNOSIS — Z3A17 17 weeks gestation of pregnancy: Secondary | ICD-10-CM

## 2018-08-26 DIAGNOSIS — Z3A20 20 weeks gestation of pregnancy: Secondary | ICD-10-CM

## 2018-08-26 DIAGNOSIS — O99012 Anemia complicating pregnancy, second trimester: Secondary | ICD-10-CM

## 2018-08-26 DIAGNOSIS — O99019 Anemia complicating pregnancy, unspecified trimester: Secondary | ICD-10-CM

## 2018-08-26 DIAGNOSIS — O2302 Infections of kidney in pregnancy, second trimester: Secondary | ICD-10-CM

## 2018-08-26 DIAGNOSIS — Z363 Encounter for antenatal screening for malformations: Secondary | ICD-10-CM

## 2018-08-26 MED ORDER — VITAFOL FE+ 90-0.6-0.4-200 MG PO CAPS: 1.0000 | ORAL_CAPSULE | Freq: Every day | ORAL | 11 refills | Status: DC

## 2018-08-26 NOTE — Telephone Encounter (Signed)
Anitbx changed by PH on 08/17/18.

## 2018-08-26 NOTE — Progress Notes (Signed)
ROB/Anatomy  Its a GIRL! C/o fatigue and back pain

## 2018-08-26 NOTE — Patient Instructions (Addendum)
Back Exercises If you have pain in your back, do these exercises 2-3 times each day or as told by your doctor. When the pain goes away, do the exercises once each day, but repeat the steps more times for each exercise (do more repetitions). If you do not have pain in your back, do these exercises once each day or as told by your doctor. Exercises Single Knee to Chest Do these steps 3-5 times in a row for each leg: 1. Lie on your back on a firm bed or the floor with your legs stretched out. 2. Bring one knee to your chest. 3. Hold your knee to your chest by grabbing your knee or thigh. 4. Pull on your knee until you feel a gentle stretch in your lower back. 5. Keep doing the stretch for 10-30 seconds. 6. Slowly let go of your leg and straighten it. Pelvic Tilt Do these steps 5-10 times in a row: 1. Lie on your back on a firm bed or the floor with your legs stretched out. 2. Bend your knees so they point up to the ceiling. Your feet should be flat on the floor. 3. Tighten your lower belly (abdomen) muscles to press your lower back against the floor. This will make your tailbone point up to the ceiling instead of pointing down to your feet or the floor. 4. Stay in this position for 5-10 seconds while you gently tighten your muscles and breathe evenly. Cat-Cow Do these steps until your lower back bends more easily: 1. Get on your hands and knees on a firm surface. Keep your hands under your shoulders, and keep your knees under your hips. You may put padding under your knees. 2. Let your head hang down, and make your tailbone point down to the floor so your lower back is round like the back of a cat. 3. Stay in this position for 5 seconds. 4. Slowly lift your head and make your tailbone point up to the ceiling so your back hangs low (sags) like the back of a cow. 5. Stay in this position for 5 seconds.  Press-Ups Do these steps 5-10 times in a row: 1. Lie on your belly (face-down) on the floor.  2. Place your hands near your head, about shoulder-width apart. 3. While you keep your back relaxed and keep your hips on the floor, slowly straighten your arms to raise the top half of your body and lift your shoulders. Do not use your back muscles. To make yourself more comfortable, you may change where you place your hands. 4. Stay in this position for 5 seconds. 5. Slowly return to lying flat on the floor.  Bridges Do these steps 10 times in a row: 1. Lie on your back on a firm surface. 2. Bend your knees so they point up to the ceiling. Your feet should be flat on the floor. 3. Tighten your butt muscles and lift your butt off of the floor until your waist is almost as high as your knees. If you do not feel the muscles working in your butt and the back of your thighs, slide your feet 1-2 inches farther away from your butt. 4. Stay in this position for 3-5 seconds. 5. Slowly lower your butt to the floor, and let your butt muscles relax. If this exercise is too easy, try doing it with your arms crossed over your chest. Belly Crunches Do these steps 5-10 times in a row: 1. Lie on your back on a  firm bed or the floor with your legs stretched out. 2. Bend your knees so they point up to the ceiling. Your feet should be flat on the floor. 3. Cross your arms over your chest. 4. Tip your chin a little bit toward your chest but do not bend your neck. 5. Tighten your belly muscles and slowly raise your chest just enough to lift your shoulder blades a tiny bit off of the floor. 6. Slowly lower your chest and your head to the floor. Back Lifts Do these steps 5-10 times in a row: 1. Lie on your belly (face-down) with your arms at your sides, and rest your forehead on the floor. 2. Tighten the muscles in your legs and your butt. 3. Slowly lift your chest off of the floor while you keep your hips on the floor. Keep the back of your head in line with the curve in your back. Look at the floor while you  do this. 4. Stay in this position for 3-5 seconds. 5. Slowly lower your chest and your face to the floor. Contact a doctor if:  Your back pain gets a lot worse when you do an exercise.  Your back pain does not lessen 2 hours after you exercise. If you have any of these problems, stop doing the exercises. Do not do them again unless your doctor says it is okay. Get help right away if:  You have sudden, very bad back pain. If this happens, stop doing the exercises. Do not do them again unless your doctor says it is okay. This information is not intended to replace advice given to you by your health care provider. Make sure you discuss any questions you have with your health care provider. Document Released: 07/06/2010 Document Revised: 02/25/2018 Document Reviewed: 07/28/2014 Elsevier Interactive Patient Education  2019 [employer].   Iron-Rich Diet  Iron is a mineral that helps your body to produce hemoglobin. Hemoglobin is a protein in red blood cells that carries oxygen to your body's tissues. Eating too little iron may cause you to feel weak and tired, and it can increase your risk of infection. Iron is naturally found in many foods, and many foods have iron added to them (iron-fortified foods). You may need to follow an iron-rich diet if you do not have enough iron in your body due to certain medical conditions. The amount of iron that you need each day depends on your age, your sex, and any medical conditions you have. Follow instructions from your health care provider or a diet and nutrition specialist (dietitian) about how much iron you should eat each day. What are tips for following this plan? Reading food labels  Check food labels to see how many milligrams (mg) of iron are in each serving. Cooking  Cook foods in pots and pans that are made from iron.  Take these steps to make it easier for your body to absorb iron from certain foods: ? Soak beans overnight before cooking. ?  Soak whole grains overnight and drain them before using. ? Ferment flours before baking, such as by using yeast in bread dough. Meal planning  When you eat foods that contain iron, you should eat them with foods that are high in vitamin C. These include oranges, peppers, tomatoes, potatoes, and mango. Vitamin C helps your body to absorb iron. General information  Take iron supplements only as told by your health care provider. An overdose of iron can be life-threatening. If you were prescribed iron  supplements, take them with orange juice or a vitamin C supplement.  When you eat iron-fortified foods or take an iron supplement, you should also eat foods that naturally contain iron, such as meat, poultry, and fish. Eating naturally iron-rich foods helps your body to absorb the iron that is added to other foods or contained in a supplement.  Certain foods and drinks prevent your body from absorbing iron properly. Avoid eating these foods in the same meal as iron-rich foods or with iron supplements. These foods include: ? Coffee, black tea, and red wine. ? Milk, dairy products, and foods that are high in calcium. ? Beans and soybeans. ? Whole grains. What foods should I eat? Fruits Prunes. Raisins. Eat fruits high in vitamin C, such as oranges, grapefruits, and strawberries, alongside iron-rich foods. Vegetables Spinach (cooked). Green peas. Broccoli. Fermented vegetables. Eat vegetables high in vitamin C, such as leafy greens, potatoes, bell peppers, and tomatoes, alongside iron-rich foods. Grains Iron-fortified breakfast cereal. Iron-fortified whole-wheat bread. Enriched rice. Sprouted grains. Meats and other proteins Beef liver. Oysters. Beef. Shrimp. [place]. Chicken. Tuna. Sardines. Chickpeas. Nuts. Tofu. Pumpkin seeds. Beverages Tomato juice. Fresh orange juice. Prune juice. Hibiscus tea. Fortified instant breakfast shakes. Sweets and desserts Blackstrap molasses. Seasonings and  condiments Tahini. Fermented soy sauce. Other foods Wheat germ. The items listed above may not be a complete list of recommended foods and beverages. Contact a dietitian for more information. What foods should I avoid? Grains Whole grains. Bran cereal. Bran flour. Oats. Meats and other proteins Soybeans. Products made from soy protein. Black beans. Lentils. Mung beans. Split peas. Dairy Milk. Cream. Cheese. Yogurt. Cottage cheese. Beverages Coffee. Black tea. Red wine. Sweets and desserts Cocoa. Chocolate. Ice cream. Other foods Basil. Oregano. Large amounts of parsley. The items listed above may not be a complete list of foods and beverages to avoid. Contact a dietitian for more information. Summary  Iron is a mineral that helps your body to produce hemoglobin. Hemoglobin is a protein in red blood cells that carries oxygen to your body's tissues.  Iron is naturally found in many foods, and many foods have iron added to them (iron-fortified foods).  When you eat foods that contain iron, you should eat them with foods that are high in vitamin C. Vitamin C helps your body to absorb iron.  Certain foods and drinks prevent your body from absorbing iron properly, such as whole grains and dairy products. You should avoid eating these foods in the same meal as iron-rich foods or with iron supplements. This information is not intended to replace advice given to you by your health care provider. Make sure you discuss any questions you have with your health care provider. Document Released: 01/15/2005 Document Revised: 04/29/2017 Document Reviewed: 04/29/2017 Elsevier Interactive Patient Education  2019 [employer].

## 2018-08-26 NOTE — Progress Notes (Signed)
Routine Prenatal Care Visit  Subjective  Molly Graves is a 28 y.o. [phone] at [redacted]w[redacted]d being seen today for ongoing prenatal care.  She is currently monitored for the following issues for this low-risk pregnancy and has Complication of intrauterine device (IUD) (HCC); Supervision of other normal pregnancy, antepartum; and Pyelonephritis affecting pregnancy in second trimester on their problem list.  ----------------------------------------------------------------------------------- Patient reports backache.  No dysuria, no fevers. She has been taking macrobid. Back hurts after working and being on her feet. Contractions: Not present. Vag. Bleeding: None.  Movement: Present. Denies leaking of fluid.  ----------------------------------------------------------------------------------- The following portions of the patient's history were reviewed and updated as appropriate: allergies, current medications, past family history, past medical history, past social history, past surgical history and problem list. Problem list updated.   Objective  Blood pressure 110/60, weight 170 lb (77.1 kg), last menstrual period 04/06/2018. Pregravid weight 164 lb (74.4 kg) Total Weight Gain 6 lb (2.722 kg) Urinalysis:      Fetal Status: Fetal Heart Rate (bpm): 145   Movement: Present  Presentation: Vertex  General:  Alert, oriented and cooperative. Patient is in no acute distress.  Skin: Skin is warm and dry. No rash noted.   Cardiovascular: Normal heart rate noted  Respiratory: Normal respiratory effort, no problems with respiration noted  Abdomen: Soft, gravid, appropriate for gestational age. Pain/Pressure: Present     Pelvic:  Cervical exam deferred        Extremities: Normal range of motion.  Edema: None  Mental Status: Normal mood and affect. Normal behavior. Normal judgment and thought content.   No CVA tenderness Tenderness to palpation over SI joint  Assessment   28 y.o. [id number] at [redacted]w[redacted]d by   01/11/2019, by Last Menstrual Period presenting for routine prenatal visit  Plan   fourth Problems (from 05/22/18 to present)    Problem Noted Resolved   Supervision of other normal pregnancy, antepartum 05/22/2018 by Oswaldo Conroy, CNM No   Overview Addendum 08/26/2018  4:59 PM by Natale Milch, MD    Clinic [facility] Prenatal Labs  Dating [place] Blood type: O/Positive/-- (12/23 1605)   Genetic Screen declines Antibody:Negative (12/23 1605)  Anatomic [place]  incomplete Rubella: 1.86 (12/23 1605) Varicella:    GTT Third trimester:  RPR: Non Reactive (12/23 1605)   Rhogam O+ HBsAg: Negative (12/23 1605)   TDaP vaccine              Flu Shot: HIV: Non Reactive (12/23 1605)   Baby Food                                GBS: pending  Contraception Declines BTL, IUD.  Undecided KPQ:AESLP PP  CBB     CS/VBAC na   Support Person                  Gestational age appropriate obstetric precautions including but not limited to vaginal bleeding, contractions, leaking of fluid and fetal movement were reviewed in detail with the patient.    Anemia- labs ordered, will return to have them collected. Referral to hematology. Vitafol Fe ordered, patient given samples. Anatomy US today incomplete, order placed to finish at hospital. SI joint pain, given exercises and stretches, discussed supportive care. Urine culture sent Given list of prenatal classes Given ready set baby book Discussed breast feeding, she breast fed her son, would like to again.  Given information about Doula  service at the hospital.   Return in about 4 weeks (around 09/23/2018) for ROB 4 weeks, lab appointment 1-2 days.  Natale Milch MD [facility], [facility] 08/26/2018, 5:23 PM

## 2018-08-28 ENCOUNTER — Other Ambulatory Visit: Payer: Medicaid Other

## 2018-08-28 ENCOUNTER — Other Ambulatory Visit: Payer: Self-pay

## 2018-08-28 DIAGNOSIS — O99019 Anemia complicating pregnancy, unspecified trimester: Secondary | ICD-10-CM

## 2018-08-29 LAB — IRON AND TIBC
IRON: 51 ug/dL (ref 27–159)
Iron Saturation: 12 % — ABNORMAL LOW (ref 15–55)
Total Iron Binding Capacity: 416 ug/dL (ref 250–450)
UIBC: 365 ug/dL (ref 131–425)

## 2018-08-29 LAB — VITAMIN B12: Vitamin B-12: 360 pg/mL (ref 232–1245)

## 2018-08-29 LAB — FOLATE: Folate: 8.3 ng/mL (ref >3.0)

## 2018-08-29 LAB — FERRITIN: Ferritin: 12 ng/mL — ABNORMAL LOW (ref 15–150)

## 2018-08-29 LAB — URINE CULTURE

## 2018-08-31 NOTE — Progress Notes (Signed)
Released to mychart with note- has hematology appointment this week.

## 2018-09-03 ENCOUNTER — Inpatient Hospital Stay: Payer: Medicaid Other | Attending: Oncology | Admitting: Oncology

## 2018-09-03 ENCOUNTER — Other Ambulatory Visit: Payer: Self-pay

## 2018-09-03 ENCOUNTER — Encounter: Payer: Self-pay | Admitting: Oncology

## 2018-09-03 VITALS — BP 103/67 | HR 72 | Temp 96.5°F | Resp 18 | Wt 175.7 lb

## 2018-09-03 DIAGNOSIS — Z79899 Other long term (current) drug therapy: Secondary | ICD-10-CM

## 2018-09-03 DIAGNOSIS — O99012 Anemia complicating pregnancy, second trimester: Secondary | ICD-10-CM | POA: Diagnosis present

## 2018-09-03 DIAGNOSIS — D509 Iron deficiency anemia, unspecified: Secondary | ICD-10-CM | POA: Insufficient documentation

## 2018-09-03 DIAGNOSIS — Z3A21 21 weeks gestation of pregnancy: Secondary | ICD-10-CM | POA: Diagnosis not present

## 2018-09-03 DIAGNOSIS — R5383 Other fatigue: Secondary | ICD-10-CM

## 2018-09-03 NOTE — Progress Notes (Signed)
Patient here for initial visit. Pt is [redacted] weeks pregnant. Complains of extreme fatigue and heartburn.

## 2018-09-03 NOTE — Patient Instructions (Signed)
Coronavirus (COVID-19) Are you at risk?  Are you at risk for the Coronavirus (COVID-19)?  To be considered HIGH RISK for Coronavirus (COVID-19), you have to meet the following criteria:  . Traveled to [place], [place], [place], [place] or [place]; or in the [place] to [place], [place], [place], or [place]; and have fever, cough, and shortness of breath within the last 2 weeks of travel OR . Been in close contact with a person diagnosed with COVID-19 within the last 2 weeks and have fever, cough, and shortness of breath . IF YOU DO NOT MEET THESE CRITERIA, YOU ARE CONSIDERED LOW RISK FOR COVID-19.  What to do if you are HIGH RISK for COVID-19?  . If you are having a medical emergency, call 911. . Seek medical care right away. Before you go to a doctor's office, urgent care or emergency department, call ahead and tell them about your recent travel, contact with someone diagnosed with COVID-19, and your symptoms. You should receive instructions from your physician's office regarding next steps of care.  . When you arrive at healthcare provider, tell the healthcare staff immediately you have returned from visiting [place], [place], [place], [place] or [place]; or traveled in the [place] to [place], [place], [place], or [place]; in the last two weeks or you have been in close contact with a person diagnosed with COVID-19 in the last 2 weeks.   . Tell the health care staff about your symptoms: fever, cough and shortness of breath. . After you have been seen by a medical provider, you will be either: o Tested for (COVID-19) and discharged home on quarantine except to seek medical care if symptoms worsen, and asked to  - Stay home and avoid contact with others until you get your results (4-5 days)  - Avoid travel on public transportation if possible (such as bus, train, or airplane) or o Sent to the Emergency Department by EMS for evaluation, COVID-19 testing, and possible  admission depending on your condition and test results.  What to do if you are LOW RISK for COVID-19?  Reduce your risk of any infection by using the same precautions used for avoiding the common cold or flu:  . Wash your hands often with soap and warm water for at least 20 seconds.  If soap and water are not readily available, use an alcohol-based hand sanitizer with at least 60% alcohol.  . If coughing or sneezing, cover your mouth and nose by coughing or sneezing into the elbow areas of your shirt or coat, into a tissue or into your sleeve (not your hands). . Avoid shaking hands with others and consider head nods or verbal greetings only. . Avoid touching your eyes, nose, or mouth with unwashed hands.  . Avoid close contact with people who are sick. . Avoid places or events with large numbers of people in one location, like concerts or sporting events. . Carefully consider travel plans you have or are making. . If you are planning any travel outside or inside the [place], visit the CDC's Travelers' Health webpage for the latest health notices. . If you have some symptoms but not all symptoms, continue to monitor at home and seek medical attention if your symptoms worsen. . If you are having a medical emergency, call 911.   ADDITIONAL HEALTHCARE OPTIONS FOR PATIENTS  [facility] / e-Visit: [email or web address]         [facility]: [phone]  [facility]: [phone]                   [facility]: [phone]   

## 2018-09-03 NOTE — Progress Notes (Signed)
Hematology/Oncology Consult note [facility] Telephone:([phone] [phone]   Patient Care Team: Nira Retort as PCP - General  REFERRING PROVIDER: GYN Dr. Jerene Pitch CHIEF COMPLAINTS/REASON FOR VISIT:  Evaluation of iron deficiency anemia in pregnancy  HISTORY OF PRESENTING ILLNESS:  Molly Graves is a  28 y.o.  female with PMH listed below who was referred to me for evaluation of iron deficiency anemia in pregnancy. Patient currently is in her 21-week pregnancy.  Molly Graves has 2 kids. Reports history of iron deficiency anemia during her second pregnancy and Molly Graves has to have blood transfusions after delivery. Molly Graves has tried oral iron supplementations in the past.  Molly Graves experiences GI toxicities including stomach discomfort and severe constipation. Denies any vaginal bleeding, blood in the stool or black tarry stool. Molly Graves was recently hospitalized due to pyelonephritis and finished antibiotics course. Reports feeling extremely tired and fatigued.  Mild shortness of breath with exertion.  Craving orange juice.   Review of Systems  Constitutional: Positive for fatigue. Negative for appetite change, chills and fever.  HENT:   Negative for hearing loss and voice change.   Eyes: Negative for eye problems.  Respiratory: Positive for shortness of breath. Negative for chest tightness and cough.   Cardiovascular: Negative for chest pain.  Gastrointestinal: Positive for constipation. Negative for abdominal distention, abdominal pain and blood in stool.  Endocrine: Negative for hot flashes.  Genitourinary: Negative for difficulty urinating and frequency.   Musculoskeletal: Negative for arthralgias.  Skin: Negative for itching and rash.  Neurological: Negative for extremity weakness.  Hematological: Negative for adenopathy.  Psychiatric/Behavioral: Negative for confusion.    MEDICAL HISTORY:  Past Medical History:  Diagnosis Date  . Anemia   . Depression     post partum depression  . Headache    MIGRAINES  . Herpes genitalia     SURGICAL HISTORY: Past Surgical History:  Procedure Laterality Date  . BREAST SURGERY     lumpectomy left breast  . DIAGNOSTIC LAPAROSCOPY  2015   to rule out uterine injury after D&E  . HYSTEROSCOPY W/D&C N/A 04/19/2016   Procedure: DILATATION AND CURETTAGE /HYSTEROSCOPY;  Surgeon: Nadara Mustard, MD;  Location: [facility] ORS;  Service: Gynecology;  Laterality: N/A;  . INDUCED ABORTION  2015  . IUD REMOVAL N/A 04/19/2016   Procedure: INTRAUTERINE DEVICE (IUD) REMOVAL;  Surgeon: Nadara Mustard, MD;  Location: [facility] ORS;  Service: Gynecology;  Laterality: N/A;  . LAPAROSCOPY N/A 04/19/2016   Procedure: LAPAROSCOPY DIAGNOSTIC;  Surgeon: Nadara Mustard, MD;  Location: [facility] ORS;  Service: Gynecology;  Laterality: N/A;    SOCIAL HISTORY: Social History   Socioeconomic History  . Marital status: Single    Spouse name: Not on file  . Number of children: Not on file  . Years of education: Not on file  . Highest education level: Not on file  Occupational History  . Not on file  Social Needs  . Financial resource strain: Not on file  . Food insecurity:    Worry: Not on file    Inability: Not on file  . Transportation needs:    Medical: Not on file    Non-medical: Not on file  Tobacco Use  . Smoking status: Former Smoker    Years: 7.00    Types: Cigars    Last attempt to quit: 04/17/2018    Years since quitting: 0.3  . Smokeless tobacco: Never Used  Substance and Sexual Activity  . Alcohol use: Not Currently  . Drug  use: No  . Sexual activity: Yes    Partners: Male    Birth control/protection: None  Lifestyle  . Physical activity:    Days per week: 6 days    Minutes per session: Not on file  . Stress: To some extent  Relationships  . Social connections:    Talks on phone: More than three times a week    Gets together: More than three times a week    Attends religious service: Never    Active  member of club or organization: Yes    Attends meetings of clubs or organizations: Never    Relationship status: Living with partner  . Intimate partner violence:    Fear of current or ex partner: No    Emotionally abused: No    Physically abused: No    Forced sexual activity: No  Other Topics Concern  . Not on file  Social History Narrative  . Not on file    FAMILY HISTORY: Family History  Problem Relation Age of Onset  . Hypertension Maternal Aunt   . Hypertension Maternal Grandmother   . Cancer Other 29       Cervical, Malignant  . Depression Mother   . Hypertension Mother   . Cancer Maternal Uncle   . Cancer Paternal Aunt     ALLERGIES:  has No Known Allergies.  MEDICATIONS:  Current Outpatient Medications  Medication Sig Dispense Refill  . acetaminophen (TYLENOL) 500 MG tablet Take 500 mg by mouth every 6 (six) hours as needed for mild pain, moderate pain, fever or headache.    . Doxylamine-Pyridoxine (DICLEGIS) 10-10 MG TBEC Take 2 tablets by mouth at bedtime. If symptoms persist, add one tablet in the morning and one in the afternoon 100 tablet 5  . nitrofurantoin, macrocrystal-monohydrate, (MACROBID) 100 MG capsule Take 1 capsule (100 mg total) by mouth at bedtime. Suppression for UTI rest of pregnancy 30 capsule 4  . Prenat-Fe Poly-Methfol-FA-DHA (VITAFOL FE+) 90-0.6-0.4-200 MG CAPS Take 1 tablet by mouth daily. 30 capsule 11  . oxyCODONE (OXY IR/ROXICODONE) 5 MG immediate release tablet Take 1 tablet (5 mg total) by mouth every 4 (four) hours as needed for moderate pain or severe pain. (Patient not taking: Reported on 08/26/2018) 10 tablet 0  . Prenat-FeFum-FePo-FA-DHA w/o A (PROVIDA DHA) 16-16-1.25-110 MG CAPS Take 1 capsule by mouth daily. (Patient not taking: Reported on 09/03/2018) 30 capsule 11   No current facility-administered medications for this visit.      PHYSICAL EXAMINATION: ECOG PERFORMANCE STATUS: 1 - Symptomatic but completely ambulatory Vitals:    09/03/18 0913  BP: 103/67  Pulse: 72  Resp: 18  Temp: (!) 96.5 F (35.8 C)   Filed Weights   09/03/18 0913  Weight: 175 lb 11.2 oz (79.7 kg)    Physical Exam Constitutional:      General: Molly Graves is not in acute distress. HENT:     Head: Normocephalic and atraumatic.  Eyes:     General: No scleral icterus.    Pupils: Pupils are equal, round, and reactive to light.  Neck:     Musculoskeletal: Normal range of motion and neck supple.  Cardiovascular:     Rate and Rhythm: Normal rate and regular rhythm.     Heart sounds: Normal heart sounds.  Pulmonary:     Effort: Pulmonary effort is normal. No respiratory distress.     Breath sounds: No wheezing.  Abdominal:     General: Bowel sounds are normal. There is no distension.  Palpations: Abdomen is soft. There is no mass.     Tenderness: There is no abdominal tenderness.     Comments: Gravid uterus  Musculoskeletal: Normal range of motion.        General: No deformity.  Skin:    General: Skin is warm and dry.     Coloration: Skin is pale.     Findings: No erythema or rash.  Neurological:     Mental Status: Molly Graves is alert and oriented to person, place, and time.     Cranial Nerves: No cranial nerve deficit.     Coordination: Coordination normal.  Psychiatric:        Behavior: Behavior normal.        Thought Content: Thought content normal.     RADIOGRAPHIC STUDIES: I have personally reviewed the radiological images as listed and agreed with the findings in the report.  CMP Latest Ref Rng & Units 08/08/2018  Glucose 70 - 99 mg/dL -  BUN 6 - 20 mg/dL -  Creatinine 8.29 - 9.37 mg/dL -  Sodium 169 - 678 mmol/L 132(L)  Potassium 3.5 - 5.1 mmol/L 3.6  Chloride 98 - 111 mmol/L 110  CO2 22 - 32 mmol/L 18(L)  Calcium 8.9 - 10.3 mg/dL -  Total Protein 6.5 - 8.1 g/dL -  Total Bilirubin 0.3 - 1.2 mg/dL -  Alkaline Phos 38 - 938 U/L -  AST 15 - 41 U/L -  ALT 0 - 44 U/L -   CBC Latest Ref Rng & Units 08/10/2018  WBC 4.0 - 10.5  K/uL 9.1  Hemoglobin 12.0 - 15.0 g/dL 1.0(F)  Hematocrit 75.1 - 46.0 % 28.9(L)  Platelets 150 - 400 K/uL 208    LABORATORY DATA:  I have reviewed the data as listed Lab Results  Component Value Date   WBC 9.1 08/10/2018   HGB 9.1 (L) 08/10/2018   HCT 28.9 (L) 08/10/2018   MCV 87.3 08/10/2018   PLT 208 08/10/2018   Recent Labs    06/05/18 0819 08/07/18 1910 08/08/18 1948  NA 136 131* 132*  K 3.7 3.1* 3.6  CL 107 105 110  CO2 22 19* 18*  GLUCOSE 96 107*  --   BUN 12 6  --   CREATININE 0.84 0.60  --   CALCIUM 8.8* 8.2*  --   GFRNONAA >60 >60  --   GFRAA >60 >60  --   PROT 6.7 6.3*  --   ALBUMIN 3.4* 2.8*  --   AST 19 18  --   ALT 16 15  --   ALKPHOS 49 59  --   BILITOT 0.4 0.6  --    Iron/TIBC/Ferritin/ %Sat    Component Value Date/Time   IRON 51 08/28/2018 0903   TIBC 416 08/28/2018 0903   FERRITIN 12 (L) 08/28/2018 0903   IRONPCTSAT 12 (L) 08/28/2018 0903        ASSESSMENT & PLAN:  1. Anemia in pregnancy, second trimester   2. Iron deficiency anemia, unspecified iron deficiency anemia type   3. Other fatigue    Labs reviewed and discussed with patient.  Molly Graves has severe anemia with hemoglobin between 8-9 with decreased ferritin level at 12, decreased TSAT at 12 increased TIBC 416.  These are consistent with iron deficiency anemia in pregnancy. I discussed about trials of oral iron supplementation which is effective but may take a long period of time to replete her iron stores. Other option will be proceeding with IV iron infusion. Plan IV iron  with Venofer  weekly x 4 doses. Allergy reactions/infusion reaction including anaphylactic reaction discussed with patient. Other side effects include but not limited to high blood pressure, skin rash, weight gain, leg swelling, etc. Patient voices understanding and willing to proceed. Patient prefers IV iron infusion as Molly Graves feels profoundly weak and fatigued due to iron deficiency. Recommend patient to repeat CBC,  iron, TIBC ferritin, MD assessment to assess treatment response.  Orders Placed This Encounter  Procedures  . CBC with Differential/Platelet    Standing Status:   Future    Standing Expiration Date:   09/03/2019  . Ferritin    Standing Status:   Future    Standing Expiration Date:   09/03/2019  . Iron and TIBC    Standing Status:   Future    Standing Expiration Date:   09/03/2019    All questions were answered. The patient knows to call the clinic with any problems questions or concerns.  Return of visit: 8 weeks Thank you for this kind referral and the opportunity to participate in the care of this patient. A copy of today's note is routed to referring provider  Total face to face encounter time for this patient visit was 45 min. >50% of the time was  spent in counseling and coordination of care.    Rickard Patience, MD, PhD Hematology Oncology [facility] Pager- [phone] 09/03/2018

## 2018-09-08 ENCOUNTER — Ambulatory Visit: Admission: RE | Admit: 2018-09-08 | Payer: Medicaid Other | Source: Ambulatory Visit

## 2018-09-08 ENCOUNTER — Other Ambulatory Visit: Payer: Self-pay

## 2018-09-09 ENCOUNTER — Inpatient Hospital Stay: Payer: Medicaid Other

## 2018-09-09 ENCOUNTER — Other Ambulatory Visit: Payer: Self-pay

## 2018-09-09 ENCOUNTER — Other Ambulatory Visit: Payer: Self-pay | Admitting: Obstetrics and Gynecology

## 2018-09-09 VITALS — BP 111/74 | HR 71 | Temp 96.0°F | Resp 20

## 2018-09-09 DIAGNOSIS — Z3480 Encounter for supervision of other normal pregnancy, unspecified trimester: Secondary | ICD-10-CM

## 2018-09-09 DIAGNOSIS — O99012 Anemia complicating pregnancy, second trimester: Secondary | ICD-10-CM | POA: Diagnosis not present

## 2018-09-09 DIAGNOSIS — D509 Iron deficiency anemia, unspecified: Secondary | ICD-10-CM

## 2018-09-09 MED ORDER — SODIUM CHLORIDE 0.9 % IV SOLN: 200.0000 mg | Freq: Once | INTRAVENOUS | Status: DC

## 2018-09-09 MED ORDER — IRON SUCROSE 20 MG/ML IV SOLN
200.0000 mg | Freq: Once | INTRAVENOUS | Status: AC
  Administered 2018-09-09: 200 mg via INTRAVENOUS
  Filled 2018-09-09: qty 10

## 2018-09-09 MED ORDER — SODIUM CHLORIDE 0.9 % IV SOLN
Freq: Once | INTRAVENOUS | Status: AC
  Administered 2018-09-09: 14:00:00 via INTRAVENOUS
  Filled 2018-09-09: qty 250

## 2018-09-10 ENCOUNTER — Ambulatory Visit (INDEPENDENT_AMBULATORY_CARE_PROVIDER_SITE_OTHER): Payer: Medicaid Other | Admitting: Obstetrics and Gynecology

## 2018-09-10 ENCOUNTER — Encounter: Payer: Self-pay | Admitting: Obstetrics and Gynecology

## 2018-09-10 VITALS — BP 100/50 | Wt 173.0 lb

## 2018-09-10 DIAGNOSIS — B3731 Acute candidiasis of vulva and vagina: Secondary | ICD-10-CM

## 2018-09-10 DIAGNOSIS — O4290 Premature rupture of membranes, unspecified as to length of time between rupture and onset of labor, unspecified weeks of gestation: Secondary | ICD-10-CM

## 2018-09-10 DIAGNOSIS — B373 Candidiasis of vulva and vagina: Secondary | ICD-10-CM | POA: Diagnosis not present

## 2018-09-10 DIAGNOSIS — Z3482 Encounter for supervision of other normal pregnancy, second trimester: Secondary | ICD-10-CM

## 2018-09-10 DIAGNOSIS — Z3A22 22 weeks gestation of pregnancy: Secondary | ICD-10-CM

## 2018-09-10 MED ORDER — FLUCONAZOLE 150 MG PO TABS: 150.0000 mg | ORAL_TABLET | ORAL | 1 refills | Status: DC

## 2018-09-10 NOTE — Progress Notes (Signed)
    Routine Prenatal Care Visit  Subjective  Molly Graves is a 28 y.o. [phone] at [redacted]w[redacted]d being seen today for ongoing prenatal care.  She is currently monitored for the following issues for this low-risk pregnancy and has Complication of intrauterine device (IUD) (HCC); Supervision of other normal pregnancy, antepartum; Pyelonephritis affecting pregnancy in second trimester; and Iron deficiency anemia on their problem list.  ----------------------------------------------------------------------------------- Patient reports pelvic pressure and feeling cloudy discharge leaking out like a faucet.   Contractions: Not present. Vag. Bleeding: None.  Movement: Present. Denies leaking of fluid.  ----------------------------------------------------------------------------------- The following portions of the patient's history were reviewed and updated as appropriate: allergies, current medications, past family history, past medical history, past social history, past surgical history and problem list. Problem list updated.   Objective  Blood pressure (!) 100/50, weight 173 lb (78.5 kg), last menstrual period 04/06/2018. Pregravid weight 164 lb (74.4 kg) Total Weight Gain 9 lb (4.082 kg) Urinalysis:      Fetal Status: Fetal Heart Rate (bpm): 150   Movement: Present     General:  Alert, oriented and cooperative. Patient is in no acute distress.  Skin: Skin is warm and dry. No rash noted.   Cardiovascular: Normal heart rate noted  Respiratory: Normal respiratory effort, no problems with respiration noted  Abdomen: Soft, gravid, appropriate for gestational age. Pain/Pressure: Present     Pelvic:  Cervical exam performed        Extremities: Normal range of motion.  Edema: None  Mental Status: Normal mood and affect. Normal behavior. Normal judgment and thought content.   Wet Prep: Clue Cells: Negative Fungal elements: Positive Trichomonas: Negative No ferning seen   Assessment   28 y.o.  [id number] at [redacted]w[redacted]d by  01/11/2019, by Last Menstrual Period presenting for work-in prenatal visit  Plan   fourth Problems (from 05/22/18 to present)    Problem Noted Resolved   Supervision of other normal pregnancy, antepartum 05/22/2018 by Oswaldo Conroy, CNM No   Overview Addendum 08/26/2018  4:59 PM by Natale Milch, MD    Clinic [facility] Prenatal Labs  Dating [place] Blood type: O/Positive/-- (12/23 1605)   Genetic Screen declines Antibody:Negative (12/23 1605)  Anatomic [place]  incomplete Rubella: 1.86 (12/23 1605) Varicella:    GTT Third trimester:  RPR: Non Reactive (12/23 1605)   Rhogam O+ HBsAg: Negative (12/23 1605)   TDaP vaccine              Flu Shot: HIV: Non Reactive (12/23 1605)   Baby Food                                GBS: pending  Contraception Declines BTL, IUD.  Undecided [id number] PP  CBB     CS/VBAC na   Support Person                  Gestational age appropriate obstetric precautions including but not limited to vaginal bleeding, contractions, leaking of fluid and fetal movement were reviewed in detail with the patient.    Yeast infection. Sent rx for diflucan AFI 12.5 on bedside US, infant cephalic  Return for ROB as scheduled.  Natale Milch MD [facility], [facility] 09/10/2018, 3:27 PM

## 2018-09-10 NOTE — Progress Notes (Signed)
OB problem visit C/o leaking fluid and a lot of pelvic pressure x3 days

## 2018-09-10 NOTE — Telephone Encounter (Signed)
Called and discussed with with patient- reports constant clear/cloudy drainage into the toilet like a faucet, will have her come in for a visit in office.

## 2018-09-10 NOTE — Patient Instructions (Signed)
 Hello,  Given the current COVID-19 pandemic, our practice is making changes in how we are providing care to our patients. We are limiting in-person visits for the safety of all of our patients.   As a practice, we have met to discuss the best way to minimize visits, but still provide excellent care to our expecting mothers.  We have decided on the following visit structure for low-risk pregnancies.  Initial Pregnancy visit will be conducted as a telephone or web visit.  Between 10-14 weeks  there will be one in-person visit for an ultrasound, lab work, and genetic screening. 20 weeks in-person visit with an anatomy ultrasound  28 weeks in-person office visit for a 1-hour glucose test and a TDAP vaccination 32 weeks in-person office visit 34 weeks telephone visit 36 weeks in-person office visit for GBS, chlamydia, and gonorrhea testing 38 weeks in-person office visit 40 weeks in-person office visit  Understandably, some patients will require more visits than what is outlined above. Additional visits will be determined on a case-by-case basis.   We will, as always, be available for emergencies or to address concerns that might arise between in-person visits. We ask that you allow us the opportunity to address any concerns over the phone or through a virtual visit first. We will be available to return your phone calls throughout the day.   If you are able to purchase a scale, a blood pressure machine, and a home fetal doppler visits could be limited further. This will help decrease your exposure risks, but these purchases are not a necessity.   Things seem to change daily and there is the possibility that this structure could change, please be patient as we adapt to a new way of caring for patients.   Thank you for trusting us with your prenatal care. Our practice values you and looks forward to providing you with excellent care.   Sincerely,   [facility], [facility]      COVID-19 and Your Pregnancy FAQ  How can I prevent infection with COVID-19 during my pregnancy? Social distancing is key. Please limit any interactions in public. Try and work from home if possible. Frequently wash your hands after touching possibly contaminated surfaces. Avoid touching your face.  Minimize trips to the store. Consider online ordering when possible.   Should I wear a mask? Masks should only be worn by those experiencing symptoms of COVID-19 or those with confirmed COVID-19 when they are in public or around other individuals.  What are the symptoms of COVID-19? Fever (greater than 100.4 F), dry cough, shortness of breath.  Am I more at risk for COVID-19 since I am pregnant? There is not currently data showing that pregnant women are more adversely impacted by COVID-19 than the general population. However, we know that pregnant women tend to have worse respiratory complications from similar diseases such as the flu and SARS and for this reason should be considered an at-risk population.  What do I do if I am experiencing the symptoms of COVID-19? Testing is being limited because of test availability. If you are experiencing symptoms you should quarantine yourself, and the members of your family, for at least 2 weeks at home.   Please visit this website for more information: https://www.cdc.gov/coronavirus/2019-ncov/if-you-are-sick/steps-when-sick.html  When should I go to the Emergency Room? Please go to the emergency room if you are experiencing ANY of these symptoms*:  1.    Difficulty breathing or shortness of breath 2.      Persistent pain or pressure in the chest 3.    Confusion or difficulty being aroused (or awakened) 4.    Bluish lips or face  *This list is not all inclusive. Please consult our office for any other symptoms that are severe or concerning.  What do I do if I am having difficulty breathing? You should go to the Emergency Room for  evaluation. At this time they have a tent set up for evaluating patients with COVID-19 symptoms.   How will my prenatal care be different because of the COVID-19 pandemic? It has been recommended to reduce the frequency of face-to-face visits and use resources such as telephone and virtual visits when possible. Using a scale, blood pressure machine and fetal doppler at home can further help reduce face-to-face visits. You will be provided with additional information on this topic.  We ask that you come to your visits alone to minimize potential exposures to  COVID-19.  How can I receive childbirth education? At this time in-person classes have been cancelled. You can register for online childbirth education, breastfeeding, and newborn care classes.  Please visit:  [email or web address] for more information  How will my hospital birth experience be different? The hospital is currently limiting visitors. This means that while you are in labor you can only have one person at the hospital with you. Additional family members will not be allowed to wait in the building or outside your room. Your one support person can be the father of the baby, a relative, a doula, or a friend. Once one support person is designated that person will wear a band. This band cannot be shared with multiple people.  How long will I stay in the hospital for after giving birth? It is also recommended that discharge home be expedited during the COVID-19 outbreak. This means staying for 1 day after a vaginal delivery and 2 days after a cesarean section.  What if I have COVID-19 and I am in labor? We ask that you wear a mask while on labor and delivery. We will try and accommodate you being placed in a room that is capable of filtering the air. Please call ahead if you are in labor and on your way to the hospital. The phone number for labor and delivery at  [facility] is [phone].  If I have  COVID-19 when my baby is born how can I prevent my baby from contracting COVID-19? This is an issue that will have to be discussed on a case-by-case basis. Current recommendations suggest providing separate isolation rooms for both the mother and new infant as well as limiting visitors. However, there are practical challenges to this recommendation. The situation will assuredly change and decisions will be influenced by the desires of the mother and availability of space.  Some suggestions are the use of a curtain or physical barrier between mom and infant, hand hygiene, mom wearing a mask, or 6 feet of spacing between a mom and infant.   Can I breastfeed during the COVID-19 pandemic?   Yes, breastfeeding is encouraged.  Can I breastfeed if I have COVID-19? Yes. Covid-19 has not been found in breast milk. This means you cannot give COVID-19 to your child through breast milk. Breast feeding will also help pass antibodies to fight infection to your baby.   What precautions should I take when breastfeeding if I have COVID-19? If a mother and newborn do room-in and the mother wishes to feed at the breast, she should   put on a facemask and practice hand hygiene before each feeding.  What precautions should I take when pumping if I have COVID-19? Prior to expressing breast milk, mothers should practice hand hygiene. After each pumping session, all parts that come into contact with breast milk should be thoroughly washed and the entire pump should be appropriately disinfected per the manufacturer's instructions. This expressed breast milk should be fed to the newborn by a healthy caregiver.  What if I am pregnant and work in healthcare? Based on limited data regarding COVID-19 and pregnancy, ACOG currently does not propose creating additional restrictions on pregnant health care personnel because of COVID-19 alone. Pregnant women do not appear to be at higher risk of severe disease related to COVID-19.  Pregnant health care personnel should follow CDC risk assessment and infection control guidelines for health care personnel exposed to patients with suspected or confirmed COVID-19. Adherence to recommended infection prevention and control practices is an important part of protecting all health care personnel in health care settings.    Information on COVID-19 in pregnancy is very limited; however, facilities may want to consider limiting exposure of pregnant health care personnel to patients with confirmed or suspected COVID-19 infection, especially during higher-risk procedures (eg, aerosol-generating procedures), if feasible, based on staffing availability.    

## 2018-09-15 ENCOUNTER — Other Ambulatory Visit: Payer: Self-pay

## 2018-09-16 ENCOUNTER — Inpatient Hospital Stay: Payer: Medicaid Other | Attending: Oncology

## 2018-09-16 ENCOUNTER — Other Ambulatory Visit: Payer: Self-pay

## 2018-09-16 VITALS — BP 103/65 | HR 76 | Temp 96.5°F | Resp 18

## 2018-09-16 DIAGNOSIS — O99012 Anemia complicating pregnancy, second trimester: Secondary | ICD-10-CM | POA: Insufficient documentation

## 2018-09-16 DIAGNOSIS — D509 Iron deficiency anemia, unspecified: Secondary | ICD-10-CM | POA: Insufficient documentation

## 2018-09-16 DIAGNOSIS — Z79899 Other long term (current) drug therapy: Secondary | ICD-10-CM | POA: Insufficient documentation

## 2018-09-16 MED ORDER — IRON SUCROSE 20 MG/ML IV SOLN
200.0000 mg | Freq: Once | INTRAVENOUS | Status: AC
  Administered 2018-09-16: 200 mg via INTRAVENOUS
  Filled 2018-09-16: qty 10

## 2018-09-16 MED ORDER — SODIUM CHLORIDE 0.9 % IV SOLN
Freq: Once | INTRAVENOUS | Status: AC
  Administered 2018-09-16: 15:00:00 via INTRAVENOUS
  Filled 2018-09-16: qty 250

## 2018-09-21 ENCOUNTER — Other Ambulatory Visit: Payer: Medicaid Other

## 2018-09-21 ENCOUNTER — Encounter: Payer: Medicaid Other | Admitting: Obstetrics and Gynecology

## 2018-09-23 ENCOUNTER — Inpatient Hospital Stay: Payer: Medicaid Other

## 2018-09-23 ENCOUNTER — Encounter: Payer: Medicaid Other | Admitting: Obstetrics and Gynecology

## 2018-09-29 ENCOUNTER — Other Ambulatory Visit: Payer: Self-pay

## 2018-09-30 ENCOUNTER — Inpatient Hospital Stay: Payer: Medicaid Other

## 2018-10-02 ENCOUNTER — Ambulatory Visit (INDEPENDENT_AMBULATORY_CARE_PROVIDER_SITE_OTHER): Payer: Medicaid Other | Admitting: Obstetrics and Gynecology

## 2018-10-02 ENCOUNTER — Other Ambulatory Visit: Payer: Self-pay

## 2018-10-02 ENCOUNTER — Telehealth: Payer: Self-pay | Admitting: Obstetrics and Gynecology

## 2018-10-02 VITALS — BP 100/60 | Temp 97.7°F | Wt 176.0 lb

## 2018-10-02 DIAGNOSIS — B009 Herpesviral infection, unspecified: Secondary | ICD-10-CM

## 2018-10-02 DIAGNOSIS — Z3A25 25 weeks gestation of pregnancy: Secondary | ICD-10-CM

## 2018-10-02 DIAGNOSIS — Z3480 Encounter for supervision of other normal pregnancy, unspecified trimester: Secondary | ICD-10-CM

## 2018-10-02 DIAGNOSIS — O26892 Other specified pregnancy related conditions, second trimester: Secondary | ICD-10-CM

## 2018-10-02 DIAGNOSIS — O2302 Infections of kidney in pregnancy, second trimester: Secondary | ICD-10-CM

## 2018-10-02 DIAGNOSIS — O98512 Other viral diseases complicating pregnancy, second trimester: Secondary | ICD-10-CM

## 2018-10-02 DIAGNOSIS — A6000 Herpesviral infection of urogenital system, unspecified: Secondary | ICD-10-CM | POA: Insufficient documentation

## 2018-10-02 DIAGNOSIS — R300 Dysuria: Secondary | ICD-10-CM

## 2018-10-02 DIAGNOSIS — O98519 Other viral diseases complicating pregnancy, unspecified trimester: Secondary | ICD-10-CM

## 2018-10-02 LAB — POCT URINALYSIS DIPSTICK OB
Bilirubin, UA: NEGATIVE
Blood, UA: NEGATIVE
Glucose, UA: NEGATIVE
Nitrite, UA: NEGATIVE
Spec Grav, UA: 1.015 (ref 1.010–1.025)
Urobilinogen, UA: 0.2 E.U./dL
pH, UA: 6 (ref 5.0–8.0)

## 2018-10-02 MED ORDER — NITROFURANTOIN MONOHYD MACRO 100 MG PO CAPS: 100.0000 mg | ORAL_CAPSULE | Freq: Two times a day (BID) | ORAL | 0 refills | Status: DC

## 2018-10-02 NOTE — Addendum Note (Signed)
Addended by: Donnetta Hail on: 10/02/2018 02:32 PM   Modules accepted: Orders

## 2018-10-02 NOTE — Telephone Encounter (Signed)
-----   Message from Natale Milch, MD sent at 10/02/2018  7:14 AM EDT ----- This patient needs an appointment for today 10/02/2018 in person for possible pyelonephritis.  Thank you,  Dr. Jerene Pitch

## 2018-10-02 NOTE — Progress Notes (Signed)
Routine Prenatal Care Visit  Subjective  Molly Graves is a 28 y.o. [phone] at [redacted]w[redacted]d being seen today for ongoing prenatal care.  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  ----------------------------------------------------------------------------------- Patient reports dysuria, back pain in her lower back. Denies fevers, chills.  She note an odor. She had pyelonephritis earlier in the pregnancy and stopped taking the suppression medication several weeks ago. She has anterior pelvic pain and hip pain. Denies contractions.  Contractions: Not present. Vag. Bleeding: None.  Movement: Present. Denies leaking of fluid.  ----------------------------------------------------------------------------------- The following portions of the patient's history were reviewed and updated as appropriate: allergies, current medications, past family history, past medical history, past social history, past surgical history and problem list. Problem list updated.   Objective  Blood pressure 100/60, temperature 97.7 F (36.5 C), weight 176 lb (79.8 kg), last menstrual period 04/06/2018. Pulse 75 Pregravid weight 164 lb (74.4 kg) Total Weight Gain 12 lb (5.443 kg) Urinalysis: Urine Protein    Urine Glucose    Fetal Status: Fetal Heart Rate (bpm): 130   Movement: Present     General:  Alert, oriented and cooperative. Patient is in no acute distress.  Skin: Skin is warm and dry. No rash noted.   Cardiovascular: Normal heart rate noted  Respiratory: Normal respiratory effort, no problems with respiration noted  Abdomen: Soft, gravid, appropriate for gestational age. Pain/Pressure: Present   No CVAT  Pelvic:   Cervical exam deferred        Extremities: Normal range of motion.  Edema: None  Mental Status: Normal mood and affect. Normal behavior. Normal judgment and thought content.   Labs: Urinalysis LE: trace  Blood: neg Protein: trae PH: 6.0 Spec Grav: 1.015 Ketones: trace  Assessment   28 y.o. 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 10/02/2018 by Conard Novak, MD No   Supervision of other normal pregnancy, antepartum 05/22/2018 by Oswaldo Conroy, CNM No   Overview Addendum 08/26/2018  4:59 PM by Natale Milch, MD    [facility] Prenatal Labs  Dating [place] Blood type: O/Positive/-- (12/23 1605)   Genetic Screen declines Antibody:Negative (12/23 1605)  Anatomic [place]  incomplete Rubella: 1.86 (12/23 1605) Varicella:    GTT Third trimester:  RPR: Non Reactive (12/23 1605)   Rhogam O+ HBsAg: Negative (12/23 1605)   TDaP vaccine              Flu Shot: HIV: Non Reactive (12/23 1605)   Baby Food                                GBS: pending  Contraception Declines BTL, IUD.  Undecided RDE:YCXKG PP  CBB     CS/VBAC na   Support Person                  Preterm labor symptoms and general obstetric precautions including but not limited to vaginal bleeding, contractions, leaking of fluid and fetal movement were reviewed in detail with the patient. Please refer to  After Visit Summary for other counseling recommendations.   Return in 3 days (on 10/05/2018) for Keep previously scheduled appointments.  Thomasene MohairStephen , MD, Merlinda FrederickFACOG [facility], [facility] 10/02/2018 12:20 PM

## 2018-10-02 NOTE — Telephone Encounter (Signed)
Patient is schedule 10/02/18 with SDJ

## 2018-10-04 LAB — URINE CULTURE

## 2018-10-05 ENCOUNTER — Ambulatory Visit: Payer: Medicaid Other

## 2018-10-05 ENCOUNTER — Encounter: Payer: Medicaid Other | Admitting: Obstetrics and Gynecology

## 2018-10-06 ENCOUNTER — Telehealth: Payer: Self-pay

## 2018-10-06 NOTE — Telephone Encounter (Signed)
Pt states she can't request refill of nausea medicine thru my chart & can't request from pharmacy bc she doesn't have the rx# for it. She is requesting rf to be sent to pharmacy. TW#[phone]

## 2018-10-06 NOTE — Telephone Encounter (Signed)
Spoke w/patient. Notified rx sent on 07/06/2018 with 5 refills. Advised to contact pharmacy and speak to a pharmacy associate to notify of her need of refill since she doesn't have the rx # & can't do the automated request. Refills are on file & available at the pharmacy.

## 2018-10-09 ENCOUNTER — Other Ambulatory Visit: Payer: Self-pay

## 2018-10-09 DIAGNOSIS — Z79899 Other long term (current) drug therapy: Secondary | ICD-10-CM | POA: Diagnosis not present

## 2018-10-09 DIAGNOSIS — O99343 Other mental disorders complicating pregnancy, third trimester: Secondary | ICD-10-CM | POA: Insufficient documentation

## 2018-10-09 DIAGNOSIS — Z3A27 27 weeks gestation of pregnancy: Secondary | ICD-10-CM | POA: Insufficient documentation

## 2018-10-09 DIAGNOSIS — Z87891 Personal history of nicotine dependence: Secondary | ICD-10-CM | POA: Diagnosis not present

## 2018-10-09 DIAGNOSIS — R45851 Suicidal ideations: Secondary | ICD-10-CM | POA: Insufficient documentation

## 2018-10-09 DIAGNOSIS — Z046 Encounter for general psychiatric examination, requested by authority: Secondary | ICD-10-CM | POA: Insufficient documentation

## 2018-10-09 DIAGNOSIS — F329 Major depressive disorder, single episode, unspecified: Secondary | ICD-10-CM | POA: Insufficient documentation

## 2018-10-10 ENCOUNTER — Inpatient Hospital Stay
Admission: AD | Admit: 2018-10-10 | Discharge: 2018-10-12 | DRG: 832 | Disposition: A | Discharge status: Home / Self Care | Payer: Medicaid Other | Attending: Psychiatry | Admitting: Psychiatry

## 2018-10-10 ENCOUNTER — Other Ambulatory Visit: Payer: Self-pay

## 2018-10-10 ENCOUNTER — Emergency Department
Admission: EM | Admit: 2018-10-10 | Discharge: 2018-10-10 | Disposition: A | Discharge status: Other Acute Inpatient Hospital | Payer: Medicaid Other | Attending: Emergency Medicine | Admitting: Emergency Medicine

## 2018-10-10 DIAGNOSIS — R45851 Suicidal ideations: Secondary | ICD-10-CM | POA: Diagnosis present

## 2018-10-10 DIAGNOSIS — F329 Major depressive disorder, single episode, unspecified: Secondary | ICD-10-CM | POA: Diagnosis present

## 2018-10-10 DIAGNOSIS — Z3480 Encounter for supervision of other normal pregnancy, unspecified trimester: Secondary | ICD-10-CM

## 2018-10-10 DIAGNOSIS — O99342 Other mental disorders complicating pregnancy, second trimester: Principal | ICD-10-CM | POA: Diagnosis present

## 2018-10-10 DIAGNOSIS — G4700 Insomnia, unspecified: Secondary | ICD-10-CM | POA: Diagnosis present

## 2018-10-10 DIAGNOSIS — D509 Iron deficiency anemia, unspecified: Secondary | ICD-10-CM | POA: Diagnosis present

## 2018-10-10 DIAGNOSIS — Z792 Long term (current) use of antibiotics: Secondary | ICD-10-CM | POA: Diagnosis not present

## 2018-10-10 DIAGNOSIS — O26892 Other specified pregnancy related conditions, second trimester: Secondary | ICD-10-CM | POA: Diagnosis present

## 2018-10-10 DIAGNOSIS — A6009 Herpesviral infection of other urogenital tract: Secondary | ICD-10-CM | POA: Diagnosis present

## 2018-10-10 DIAGNOSIS — Z818 Family history of other mental and behavioral disorders: Secondary | ICD-10-CM

## 2018-10-10 DIAGNOSIS — Z87891 Personal history of nicotine dependence: Secondary | ICD-10-CM

## 2018-10-10 DIAGNOSIS — Z79899 Other long term (current) drug therapy: Secondary | ICD-10-CM

## 2018-10-10 DIAGNOSIS — O98312 Other infections with a predominantly sexual mode of transmission complicating pregnancy, second trimester: Secondary | ICD-10-CM | POA: Diagnosis present

## 2018-10-10 DIAGNOSIS — O99343 Other mental disorders complicating pregnancy, third trimester: Secondary | ICD-10-CM | POA: Diagnosis not present

## 2018-10-10 DIAGNOSIS — R4589 Other symptoms and signs involving emotional state: Secondary | ICD-10-CM

## 2018-10-10 DIAGNOSIS — O99012 Anemia complicating pregnancy, second trimester: Secondary | ICD-10-CM | POA: Diagnosis present

## 2018-10-10 DIAGNOSIS — Z3493 Encounter for supervision of normal pregnancy, unspecified, third trimester: Secondary | ICD-10-CM

## 2018-10-10 DIAGNOSIS — F332 Major depressive disorder, recurrent severe without psychotic features: Secondary | ICD-10-CM | POA: Diagnosis present

## 2018-10-10 LAB — ETHANOL: Alcohol, Ethyl (B): <10 mg/dL (ref <10)

## 2018-10-10 LAB — URINE DRUG SCREEN, QUALITATIVE (ARMC ONLY)
Amphetamines, Ur Screen: NOT DETECTED
Barbiturates, Ur Screen: NOT DETECTED
Benzodiazepine, Ur Scrn: NOT DETECTED
Cannabinoid 50 Ng, Ur ~~LOC~~: NOT DETECTED
Cocaine Metabolite,Ur ~~LOC~~: NOT DETECTED
MDMA (Ecstasy)Ur Screen: NOT DETECTED
Methadone Scn, Ur: NOT DETECTED
Opiate, Ur Screen: NOT DETECTED
Phencyclidine (PCP) Ur S: NOT DETECTED
Tricyclic, Ur Screen: NOT DETECTED

## 2018-10-10 LAB — COMPREHENSIVE METABOLIC PANEL
ALT: 15 U/L (ref 0–44)
AST: 16 U/L (ref 15–41)
Albumin: 3.1 g/dL — ABNORMAL LOW (ref 3.5–5.0)
Alkaline Phosphatase: 87 U/L (ref 38–126)
Anion gap: 9 (ref 5–15)
BUN: 8 mg/dL (ref 6–20)
CO2: 21 mmol/L — ABNORMAL LOW (ref 22–32)
Calcium: 9 mg/dL (ref 8.9–10.3)
Chloride: 105 mmol/L (ref 98–111)
Creatinine, Ser: 0.57 mg/dL (ref 0.44–1.00)
GFR calc Af Amer: >60 mL/min (ref >60)
GFR calc non Af Amer: >60 mL/min (ref >60)
Glucose, Bld: 113 mg/dL — ABNORMAL HIGH (ref 70–99)
Potassium: 3.3 mmol/L — ABNORMAL LOW (ref 3.5–5.1)
Sodium: 135 mmol/L (ref 135–145)
Total Bilirubin: 0.5 mg/dL (ref 0.3–1.2)
Total Protein: 6.8 g/dL (ref 6.5–8.1)

## 2018-10-10 LAB — CBC
HCT: 33.6 % — ABNORMAL LOW (ref 36.0–46.0)
Hemoglobin: 11 g/dL — ABNORMAL LOW (ref 12.0–15.0)
MCH: 27.9 pg (ref 26.0–34.0)
MCHC: 32.7 g/dL (ref 30.0–36.0)
MCV: 85.3 fL (ref 80.0–100.0)
Platelets: 281 10*3/uL (ref 150–400)
RBC: 3.94 MIL/uL (ref 3.87–5.11)
RDW: 14.5 % (ref 11.5–15.5)
WBC: 13.7 10*3/uL — ABNORMAL HIGH (ref 4.0–10.5)
nRBC: 0 % (ref 0.0–0.2)

## 2018-10-10 LAB — ACETAMINOPHEN LEVEL: Acetaminophen (Tylenol), Serum: <10 ug/mL — ABNORMAL LOW (ref 10–30)

## 2018-10-10 LAB — PREGNANCY, URINE: Preg Test, Ur: POSITIVE — AB

## 2018-10-10 LAB — SALICYLATE LEVEL: Salicylate Lvl: <7 mg/dL (ref 2.8–30.0)

## 2018-10-10 MED ORDER — FLUOXETINE HCL 20 MG PO CAPS
20.0000 mg | ORAL_CAPSULE | Freq: Every day | ORAL | Status: DC
  Administered 2018-10-10 – 2018-10-12 (×3): 20 mg via ORAL
  Filled 2018-10-10 (×3): qty 1

## 2018-10-10 MED ORDER — NITROFURANTOIN MACROCRYSTAL 50 MG PO CAPS: 100.0000 mg | ORAL_CAPSULE | Freq: Two times a day (BID) | ORAL | Status: DC

## 2018-10-10 MED ORDER — ACETAMINOPHEN 325 MG PO TABS: 650.0000 mg | ORAL_TABLET | Freq: Four times a day (QID) | ORAL | Status: DC | PRN

## 2018-10-10 MED ORDER — PRENATAL PLUS 27-1 MG PO TABS
1.0000 | ORAL_TABLET | Freq: Every day | ORAL | Status: DC
  Administered 2018-10-10 – 2018-10-12 (×3): 1 via ORAL
  Filled 2018-10-10 (×3): qty 1

## 2018-10-10 MED ORDER — NITROFURANTOIN MACROCRYSTAL 50 MG PO CAPS
100.0000 mg | ORAL_CAPSULE | Freq: Every day | ORAL | Status: DC
  Administered 2018-10-10 – 2018-10-11 (×2): 100 mg via ORAL
  Filled 2018-10-10 (×3): qty 2

## 2018-10-10 NOTE — ED Notes (Signed)
Pt given meal tray.

## 2018-10-10 NOTE — ED Notes (Signed)
Nurse talked with patient and she states that she has having bouts of depression her entire life and that he mother suffered with depression also, she states that she crys all the time, and she and boyfriend has broken up, lives with her aunt at this time with her 2 children, and she is pregnant with 3rd child now, states she is much more emotional than usual, denies Si at this time, will continue to monitor. Patient is aware that she will be admitted to BMU.

## 2018-10-10 NOTE — ED Notes (Signed)
SOC in progress.  

## 2018-10-10 NOTE — ED Notes (Signed)
SOC complete.  

## 2018-10-10 NOTE — ED Provider Notes (Signed)
-----------------------------------------   3:35 AM on 10/10/2018 -----------------------------------------  Psychiatry consult note reviewed, recommends inpatient psychiatric treatment and involuntary commitment at this time.  I will initiate IVC.   Sharman Cheek, MD 10/10/18 [phone]

## 2018-10-10 NOTE — BH Assessment (Signed)
Patient is to be admitted to [facility] by Dr. Reita May.  Attending Physician will be Dr. Toni Amend.   Patient has been assigned to room 309, by [facility] Charge Nurse T'Yawn.   ER staff is aware of the admission:  Glenda, ER Secretary    Dr. Derrill Kay, ER MD   Fredia Beets., Patient's Nurse   Dondra Prader, Patient Access.

## 2018-10-10 NOTE — ED Notes (Signed)
Patient is alert and oriented, voices understanding of discharge to admit to BMU 309, all belongings taken with her, she transferred via w/c with escort of tech and police.

## 2018-10-10 NOTE — Plan of Care (Signed)
PT rates both depression and anxiety at 7/10. Pt denies SI, HI and AVH. PT has been educated to care plan and oriented to the unit. Pt verbalizes understanding. Torrie Mayers RN Problem: Education: Goal: Utilization of techniques to improve thought processes will improve Outcome: Progressing Goal: Knowledge of the prescribed therapeutic regimen will improve Outcome: Progressing   Problem: Activity: Goal: Interest or engagement in leisure activities will improve Outcome: Not Progressing Goal: Imbalance in normal sleep/wake cycle will improve Outcome: Not Progressing   Problem: Coping: Goal: Coping ability will improve Outcome: Progressing Goal: Will verbalize feelings Outcome: Progressing   Problem: Health Behavior/Discharge Planning: Goal: Ability to make decisions will improve Outcome: Not Progressing Goal: Compliance with therapeutic regimen will improve Outcome: Progressing   Problem: Self-Concept: Goal: Ability to identify factors that promote anxiety will improve Outcome: Progressing Goal: Level of anxiety will decrease Outcome: Not Progressing Goal: Ability to modify response to factors that promote anxiety will improve Outcome: Progressing

## 2018-10-10 NOTE — Tx Team (Signed)
Initial Treatment Plan 10/10/2018 4:20 PM [name] ERX:[id number]    PATIENT STRESSORS: Loss of relationship, financial    PATIENT STRENGTHS: Ability for insight Average or above average intelligence Supportive family/friends   PATIENT IDENTIFIED PROBLEMS: Depression   Anxiety                    DISCHARGE CRITERIA:  Ability to meet basic life and health needs Adequate post-discharge living arrangements Motivation to continue treatment in a less acute level of care  PRELIMINARY DISCHARGE PLAN: Outpatient therapy Return to previous living arrangement  PATIENT/FAMILY INVOLVEMENT: This treatment plan has been presented to and reviewed with the patient, [name]. The patient and family have been given the opportunity to ask questions and make suggestions.  Chalmers Cater, RN 10/10/2018, 4:20 PM

## 2018-10-10 NOTE — BH Assessment (Signed)
Assessment Note  Molly Graves is an 28 y.o. female who presents to the ED with c/o of worsening depression. Pt reports several issues in her home life that have been large stressors for the past few months. Pt reports that since she has become pregnant for the thirst time, lost her "good job' lost her homes, and car she just cant see to "get it together". Pt states that she is having thoughts of ending her own life because the burned of her personal struggles are so great. Pt reports that she and her children are currently living wit her aunt and she feels like she is a burden on her family.   During the assessment the pt was calm and cooperative, answering each question appropriately. Pt was soft spoken with poor eye contact but was forthcoming with information. Pt reports that she feels like her life spiraled out of control when she lost her job at [employer] is 2019. Pt spoke with [facility] and was recommended for inpatient treatment. Pt is currently under IVC. Pt endorses SI but denies HI A/V H/D.   Diagnosis: Major Depression  Past Medical History:  Past Medical History:  Diagnosis Date  . Anemia   . Depression    post partum depression  . Headache    MIGRAINES  . Herpes genitalia     Past Surgical History:  Procedure Laterality Date  . BREAST SURGERY     lumpectomy left breast  . DIAGNOSTIC LAPAROSCOPY  2015   to rule out uterine injury after D&E  . HYSTEROSCOPY W/D&C N/A 04/19/2016   Procedure: DILATATION AND CURETTAGE /HYSTEROSCOPY;  Surgeon: Nadara Mustard, MD;  Location: [facility] ORS;  Service: Gynecology;  Laterality: N/A;  . INDUCED ABORTION  2015  . IUD REMOVAL N/A 04/19/2016   Procedure: INTRAUTERINE DEVICE (IUD) REMOVAL;  Surgeon: Nadara Mustard, MD;  Location: [facility] ORS;  Service: Gynecology;  Laterality: N/A;  . LAPAROSCOPY N/A 04/19/2016   Procedure: LAPAROSCOPY DIAGNOSTIC;  Surgeon: Nadara Mustard, MD;  Location: [facility] ORS;  Service: Gynecology;  Laterality: N/A;    Family  History:  Family History  Problem Relation Age of Onset  . Hypertension Maternal Aunt   . Hypertension Maternal Grandmother   . Cancer Other 29       Cervical, Malignant  . Depression Mother   . Hypertension Mother   . Cancer Maternal Uncle   . Cancer Paternal Aunt     Social History:  reports that she quit smoking about 5 months ago. Her smoking use included cigars. She quit after 7.00 years of use. She has never used smokeless tobacco. She reports previous alcohol use. She reports that she does not use drugs.  Additional Social History:  Alcohol / Drug Use Pain Medications: SEE MAR Prescriptions: SEE MAR Over the Counter: SEE MAR History of alcohol / drug use?: No history of alcohol / drug abuse  CIWA: CIWA-Ar BP: 119/69 Pulse Rate: 83 COWS:    Allergies: No Known Allergies  Home Medications: (Not in a hospital admission)   OB/GYN Status:  Patient's last menstrual period was 04/06/2018 (exact date).  General Assessment Data Location of Assessment: [facility] ED TTS Assessment: In system Is this a Tele or Face-to-Face Assessment?: Tele Assessment Is this an Initial Assessment or a Re-assessment for this encounter?: Initial Assessment Patient Accompanied by:: N/A Language Other than English: No Living Arrangements: Other (Comment) What gender do you identify as?: Female Marital status: Single Pregnancy Status: Yes (Comment: include estimated delivery date) Living  Arrangements: Spouse/significant other Can pt return to current living arrangement?: Yes Admission Status: Involuntary Is patient capable of signing voluntary admission?: No Referral Source: Self/Family/Friend Insurance type: Medicaid  Medical Screening Exam [facility] Walk-in ONLY) Medical Exam completed: Yes  Crisis Care Plan Living Arrangements: Spouse/significant other Legal Guardian: Other:(self) Name of Psychiatrist: n/a Name of Therapist: n/a  Education Status Is patient currently in school?: No Is  the patient employed, unemployed or receiving disability?: Unemployed  Risk to self with the past 6 months Suicidal Ideation: Yes-Currently Present Has patient been a risk to self within the past 6 months prior to admission? : No Suicidal Intent: No-Not Currently/Within Last 6 Months Has patient had any suicidal intent within the past 6 months prior to admission? : Yes Is patient at risk for suicide?: Yes Suicidal Plan?: No-Not Currently/Within Last 6 Months Has patient had any suicidal plan within the past 6 months prior to admission? : No Access to Means: Yes Previous Attempts/Gestures: Yes How many times?: 2 Other Self Harm Risks: none Triggers for Past Attempts: Family contact, Spouse contact Intentional Self Injurious Behavior: None Family Suicide History: No Recent stressful life event(s): Conflict (Comment), Loss (Comment), Financial Problems, Job Loss, Trauma (Comment), Recent negative physical changes Persecutory voices/beliefs?: No Depression: Yes Depression Symptoms: Insomnia, Tearfulness, Isolating, Fatigue, Guilt, Loss of interest in usual pleasures, Feeling angry/irritable, Feeling worthless/self pity Substance abuse history and/or treatment for substance abuse?: No  Risk to Others within the past 6 months Homicidal Ideation: No Does patient have any lifetime risk of violence toward others beyond the six months prior to admission? : No Thoughts of Harm to Others: No Current Homicidal Intent: No Current Homicidal Plan: No Access to Homicidal Means: No Identified Victim: n/a History of harm to others?: No Assessment of Violence: None Noted Does patient have access to weapons?: No Criminal Charges Pending?: No Does patient have a court date: No Is patient on probation?: No  Psychosis Hallucinations: None noted Delusions: None noted  Mental Status Report Appearance/Hygiene: In scrubs Eye Contact: Fair Motor Activity: Freedom of movement Speech:  Logical/coherent, Soft, Slow Level of Consciousness: Sleeping Mood: Depressed, Anxious, Despair, Sad, Helpless Affect: Appropriate to circumstance, Depressed, Sad Anxiety Level: Minimal Thought Processes: Coherent, Relevant Judgement: Unimpaired Orientation: Person, Place, Time, Situation, Appropriate for developmental age Obsessive Compulsive Thoughts/Behaviors: None  Cognitive Functioning Memory: Recent Intact, Remote Intact Is patient IDD: No Insight: Fair Impulse Control: Good Appetite: Fair Have you had any weight changes? : Gain Amount of the weight change? (lbs): 15 lbs(due to pregnancy ) Sleep: Decreased Total Hours of Sleep: 6 Vegetative Symptoms: None  ADLScreening [facility] Assessment Services) Patient's cognitive ability adequate to safely complete daily activities?: Yes Patient able to express need for assistance with ADLs?: Yes Independently performs ADLs?: Yes (appropriate for developmental age)  Prior Inpatient Therapy Prior Inpatient Therapy: No  Prior Outpatient Therapy Prior Outpatient Therapy: No Does patient have an ACCT team?: No Does patient have Intensive In-House Services?  : No Does patient have [facility] services? : No Does patient have P4CC services?: No  ADL Screening (condition at time of admission) Patient's cognitive ability adequate to safely complete daily activities?: Yes Is the patient deaf or have difficulty hearing?: No Does the patient have difficulty seeing, even when wearing glasses/contacts?: No Does the patient have difficulty concentrating, remembering, or making decisions?: No Patient able to express need for assistance with ADLs?: Yes Does the patient have difficulty dressing or bathing?: No Independently performs ADLs?: Yes (appropriate for developmental age) Does the  patient have difficulty walking or climbing stairs?: No Weakness of Legs: None Weakness of Arms/Hands: None  Home Assistive Devices/Equipment Home Assistive  Devices/Equipment: None  Therapy Consults (therapy consults require a physician order) PT Evaluation Needed: No OT Evalulation Needed: No SLP Evaluation Needed: No Abuse/Neglect Assessment (Assessment to be complete while patient is alone) Abuse/Neglect Assessment Can Be Completed: Yes Physical Abuse: Denies Verbal Abuse: Denies Sexual Abuse: Denies Exploitation of patient/patient's resources: Denies Self-Neglect: Denies Values / Beliefs Cultural Requests During Hospitalization: None Spiritual Requests During Hospitalization: None Consults Spiritual Care Consult Needed: No Social Work Consult Needed: No Merchant navy officerAdvance Directives (For Healthcare) Does Patient Have a Medical Advance Directive?: No Would patient like information on creating a medical advance directive?: No - Patient declined       Child/Adolescent Assessment Running Away Risk: Denies  Disposition:  Disposition Initial Assessment Completed for this Encounter: Yes Disposition of Patient: Admit Type of inpatient treatment program: Adult Patient refused recommended treatment: No Mode of transportation if patient is discharged/movement?: Car  On Site Evaluation by:   Reviewed with Physician:    Treena Cosman D Carley Glendenning 10/10/2018 5:21 AM

## 2018-10-10 NOTE — BHH Suicide Risk Assessment (Signed)
[facility] Admission Suicide Risk Assessment   Nursing information obtained from:    Demographic factors:    Current Mental Status:    Loss Factors:    Historical Factors:    Risk Reduction Factors:     Total Time spent with patient: 1 hour Principal Problem: Severe recurrent major depression without psychotic features (HCC) Diagnosis:  Principal Problem:   Severe recurrent major depression without psychotic features (HCC) Active Problems:   Supervision of other normal pregnancy, antepartum   Iron deficiency anemia   MDD (major depressive disorder)  Subjective Data: Patient seen and chart reviewed.  Patient with multiple symptoms of severe depression recurrent.  No psychotic symptoms.  Has had thoughts about "not being here" but denies any intention to harm her self.  Denies any actual thought of doing anything specific to harm her self.  Denies psychotic symptoms denies homicidal ideation.  Shows good insight into her depression.  Continued Clinical Symptoms:    The "Alcohol Use Disorders Identification Test", Guidelines for Use in Primary Care, Second Edition.  World Science writer [facility]). Score between 0-7:  no or low risk or alcohol related problems. Score between 8-15:  moderate risk of alcohol related problems. Score between 16-19:  high risk of alcohol related problems. Score 20 or above:  warrants further diagnostic evaluation for alcohol dependence and treatment.   CLINICAL FACTORS:   Depression:   Anhedonia   Musculoskeletal: Strength & Muscle Tone: within normal limits Gait & Station: normal Patient leans: N/A  Psychiatric Specialty Exam: Physical Exam  Nursing note and vitals reviewed. Constitutional: She appears well-developed and well-nourished.  HENT:  Head: Normocephalic and atraumatic.  Eyes: Pupils are equal, round, and reactive to light. Conjunctivae are normal.  Neck: Normal range of motion.  Cardiovascular: Regular rhythm and normal heart sounds.   Respiratory: Effort normal. No respiratory distress.  GI: Soft.    Musculoskeletal: Normal range of motion.  Neurological: She is alert.  Skin: Skin is warm and dry.  Psychiatric: Judgment normal. Her affect is blunt. Her speech is delayed. She is slowed. Thought content is not paranoid. Cognition and memory are normal. She exhibits a depressed mood. She expresses suicidal ideation. She expresses no homicidal ideation. She expresses no suicidal plans.    Review of Systems  Constitutional: Negative.   HENT: Negative.   Eyes: Negative.   Respiratory: Negative.   Cardiovascular: Negative.   Gastrointestinal: Negative.   Musculoskeletal: Negative.   Skin: Negative.   Neurological: Negative.   Psychiatric/Behavioral: Positive for depression and suicidal ideas. Negative for hallucinations, memory loss and substance abuse. The patient is nervous/anxious and has insomnia.     Last menstrual period 04/06/2018.There is no height or weight on file to calculate BMI.  General Appearance: Casual  Eye Contact:  Good  Speech:  Slow  Volume:  Decreased  Mood:  Depressed  Affect:  Depressed  Thought Process:  Coherent  Orientation:  Full (Time, Place, and Person)  Thought Content:  Logical  Suicidal Thoughts:  Yes.  without intent/plan  Homicidal Thoughts:  No  Memory:  Immediate;   Fair Recent;   Fair Remote;   Fair  Judgement:  Fair  Insight:  Fair  Psychomotor Activity:  Decreased  Concentration:  Concentration: Fair  Recall:  [employer] of Knowledge:  Fair  Language:  Fair  Akathisia:  No  Handed:  Right  AIMS (if indicated):     Assets:  Desire for Improvement Housing Physical Health Resilience Social Support  ADL's:  Intact  Cognition:  WNL  Sleep:         COGNITIVE FEATURES THAT CONTRIBUTE TO RISK:  Thought constriction (tunnel vision)    SUICIDE RISK:   Mild:  Suicidal ideation of limited frequency, intensity, duration, and specificity.  There are no identifiable  plans, no associated intent, mild dysphoria and related symptoms, good self-control (both objective and subjective assessment), few other risk factors, and identifiable protective factors, including available and accessible social support.  PLAN OF CARE: Patient will be kept on 15-minute checks.  Engage in individual and group therapy.  Full treatment team assessment.  Initiate antidepressant medicine.  Work on establishing safe discharge plan.  I certify that inpatient services furnished can reasonably be expected to improve the patient's condition.   Mordecai RasmussenJohn Clapacs, MD 10/10/2018, 2:53 PM

## 2018-10-10 NOTE — ED Notes (Signed)
Psych Dr. Sharion Dove patient. Patient is calm and cooperative.

## 2018-10-10 NOTE — H&P (Signed)
Psychiatric Admission Assessment Adult  Patient Identification: Molly Graves MRN:  [id number] Date of Evaluation:  10/10/2018 Chief Complaint:  Depression Principal Diagnosis: Severe recurrent major depression without psychotic features (HCC) Diagnosis:  Principal Problem:   Severe recurrent major depression without psychotic features (HCC) Active Problems:   Supervision of other normal pregnancy, antepartum   Iron deficiency anemia   MDD (major depressive disorder)  History of Present Illness: Patient seen chart reviewed.  This is a patient with a history of major depression who presented to the emergency room today voluntarily seeking help for depressed mood.  Patient says she has been feeling depressed for about a year but that it has been worse over the last several months.  She is currently in her sixth month of pregnancy and notes that the depression gets much worse when she is pregnant.  She is feeling sad and down and negative most of the time.  Has little motivation.  Energy level very low.  Sleep is poor at night.  Patient admits that she has had thoughts about "not wanting to be here" but denies to me that there were any thoughts of specifically trying to kill herself or do anything violent.  Says that she loves her children too much to do that.  Denies homicidal ideation.  Denies psychotic symptoms.  She is not currently on any antidepressant or receiving any treatment for depression.  Has major stresses and that she has 2 young children at home already and has not been working due to the to the virus situation.  Denies alcohol abuse denies drug abuse.  She is getting appropriate prenatal care. Associated Signs/Symptoms: Depression Symptoms:  depressed mood, anhedonia, insomnia, psychomotor retardation, feelings of worthlessness/guilt, difficulty concentrating, hopelessness, suicidal thoughts without plan, (Hypo) Manic Symptoms:  None reported Anxiety Symptoms:  Excessive  Worry, Psychotic Symptoms:  None reported PTSD Symptoms: Negative Total Time spent with patient: 1 hour  Past Psychiatric History: Patient has a history of prior severe depression also during pregnancy.  She tells me that she took medicine during her previous pregnancy which helped with her depression.  She did not recall what the medicine was.  Looking back in her old chart I could not find a record of it although there is at least 1 mention of Zoloft.  I propose Zoloft to the patient and she did not remember the name in the past.  She denies any prior suicide attempts.  No previous hospitalizations.  No history of mania or psychosis.  Is the patient at risk to self? Yes.    Has the patient been a risk to self in the past 6 months? Yes.    Has the patient been a risk to self within the distant past? Yes.    Is the patient a risk to others? No.  Has the patient been a risk to others in the past 6 months? No.  Has the patient been a risk to others within the distant past? No.   Prior Inpatient Therapy:   Prior Outpatient Therapy:    Alcohol Screening:   Substance Abuse History in the last 12 months:  No. Consequences of Substance Abuse: Negative Previous Psychotropic Medications: Yes  Psychological Evaluations: Yes  Past Medical History:  Past Medical History:  Diagnosis Date  . Anemia   . Depression    post partum depression  . Headache    MIGRAINES  . Herpes genitalia     Past Surgical History:  Procedure Laterality Date  . BREAST SURGERY  lumpectomy left breast  . DIAGNOSTIC LAPAROSCOPY  2015   to rule out uterine injury after D&E  . HYSTEROSCOPY W/D&C N/A 04/19/2016   Procedure: DILATATION AND CURETTAGE /HYSTEROSCOPY;  Surgeon: Nadara Mustard, MD;  Location: [facility] ORS;  Service: Gynecology;  Laterality: N/A;  . INDUCED ABORTION  2015  . IUD REMOVAL N/A 04/19/2016   Procedure: INTRAUTERINE DEVICE (IUD) REMOVAL;  Surgeon: Nadara Mustard, MD;  Location: [facility] ORS;   Service: Gynecology;  Laterality: N/A;  . LAPAROSCOPY N/A 04/19/2016   Procedure: LAPAROSCOPY DIAGNOSTIC;  Surgeon: Nadara Mustard, MD;  Location: [facility] ORS;  Service: Gynecology;  Laterality: N/A;   Family History:  Family History  Problem Relation Age of Onset  . Hypertension Maternal Aunt   . Hypertension Maternal Grandmother   . Cancer Other 29       Cervical, Malignant  . Depression Mother   . Hypertension Mother   . Cancer Maternal Uncle   . Cancer Paternal Aunt    Family Psychiatric  History: Patient reports that her mother her grandmother and several other members of her family have also struggled with depression including postpartum depression.  No known family history of suicide attempts. Tobacco Screening:   Social History:  Social History   Substance and Sexual Activity  Alcohol Use Not Currently     Social History   Substance and Sexual Activity  Drug Use No    Additional Social History:                           Allergies:  No Known Allergies Lab Results:  Results for orders placed or performed during the hospital encounter of 10/10/18 (from the past 48 hour(s))  Comprehensive metabolic panel     Status: Abnormal   Collection Time: 10/10/18 12:30 AM  Result Value Ref Range   Sodium 135 135 - 145 mmol/L   Potassium 3.3 (L) 3.5 - 5.1 mmol/L   Chloride 105 98 - 111 mmol/L   CO2 21 (L) 22 - 32 mmol/L   Glucose, Bld 113 (H) 70 - 99 mg/dL   BUN 8 6 - 20 mg/dL   Creatinine, Ser 4.09 0.44 - 1.00 mg/dL   Calcium 9.0 8.9 - 81.1 mg/dL   Total Protein 6.8 6.5 - 8.1 g/dL   Albumin 3.1 (L) 3.5 - 5.0 g/dL   AST 16 15 - 41 U/L   ALT 15 0 - 44 U/L   Alkaline Phosphatase 87 38 - 126 U/L   Total Bilirubin 0.5 0.3 - 1.2 mg/dL   GFR calc non Af Amer >60 >60 mL/min   GFR calc Af Amer >60 >60 mL/min   Anion gap 9 5 - 15    Comment: Performed at [facility], [address] [place], [place] [address]  Ethanol     Status: None   Collection Time:  10/10/18 12:30 AM  Result Value Ref Range   Alcohol, Ethyl (B) <10 <10 mg/dL    Comment: (NOTE) Lowest detectable limit for serum alcohol is 10 mg/dL. For medical purposes only. Performed at [facility], [address] [place], [place] [address]   Salicylate level     Status: None   Collection Time: 10/10/18 12:30 AM  Result Value Ref Range   Salicylate Lvl <7.0 2.8 - 30.0 mg/dL    Comment: Performed at [facility], [address] [place], [place] [address]  Acetaminophen level     Status: Abnormal  Collection Time: 10/10/18 12:30 AM  Result Value Ref Range   Acetaminophen (Tylenol), Serum <10 (L) 10 - 30 ug/mL    Comment: (NOTE) Therapeutic concentrations vary significantly. A range of 10-30 ug/mL  may be an effective concentration for many patients. However, some  are best treated at concentrations outside of this range. Acetaminophen concentrations >150 ug/mL at 4 hours after ingestion  and >50 ug/mL at 12 hours after ingestion are often associated with  toxic reactions. Performed at [facility], [address] [place], [place] [address]   cbc     Status: Abnormal   Collection Time: 10/10/18 12:30 AM  Result Value Ref Range   WBC 13.7 (H) 4.0 - 10.5 K/uL   RBC 3.94 3.87 - 5.11 MIL/uL   Hemoglobin 11.0 (L) 12.0 - 15.0 g/dL   HCT 91.4 (L) 78.2 - 95.6 %   MCV 85.3 80.0 - 100.0 fL   MCH 27.9 26.0 - 34.0 pg   MCHC 32.7 30.0 - 36.0 g/dL   RDW 21.3 08.6 - 57.8 %   Platelets 281 150 - 400 K/uL   nRBC 0.0 0.0 - 0.2 %    Comment: Performed at [facility], [address], [place], [place] [address]  Urine Drug Screen, Qualitative     Status: None   Collection Time: 10/10/18 12:30 AM  Result Value Ref Range   Tricyclic, Ur Screen NONE DETECTED NONE DETECTED   Amphetamines, Ur Screen NONE DETECTED NONE DETECTED   MDMA (Ecstasy)Ur Screen NONE DETECTED NONE DETECTED   Cocaine Metabolite,Ur Unity NONE DETECTED NONE DETECTED   Opiate, Ur  Screen NONE DETECTED NONE DETECTED   Phencyclidine (PCP) Ur S NONE DETECTED NONE DETECTED   Cannabinoid 50 Ng, Ur Mamou NONE DETECTED NONE DETECTED   Barbiturates, Ur Screen NONE DETECTED NONE DETECTED   Benzodiazepine, Ur Scrn NONE DETECTED NONE DETECTED   Methadone Scn, Ur NONE DETECTED NONE DETECTED    Comment: (NOTE) Tricyclics + metabolites, urine    Cutoff 1000 ng/mL Amphetamines + metabolites, urine  Cutoff 1000 ng/mL MDMA (Ecstasy), urine              Cutoff 500 ng/mL Cocaine Metabolite, urine          Cutoff 300 ng/mL Opiate + metabolites, urine        Cutoff 300 ng/mL Phencyclidine (PCP), urine         Cutoff 25 ng/mL Cannabinoid, urine                 Cutoff 50 ng/mL Barbiturates + metabolites, urine  Cutoff 200 ng/mL Benzodiazepine, urine              Cutoff 200 ng/mL Methadone, urine                   Cutoff 300 ng/mL The urine drug screen provides only a preliminary, unconfirmed analytical test result and should not be used for non-medical purposes. Clinical consideration and professional judgment should be applied to any positive drug screen result due to possible interfering substances. A more specific alternate chemical method must be used in order to obtain a confirmed analytical result. Gas chromatography / mass spectrometry (GC/MS) is the preferred confirmat ory method. Performed at [facility], [address] [place], [place] [address]   Pregnancy, urine     Status: Abnormal   Collection Time: 10/10/18 12:30 AM  Result Value Ref Range   Preg Test, Ur POSITIVE (A) NEGATIVE    Comment: Performed  at [facility], [address], [place], [place] [address]    Blood Alcohol level:  Lab Results  Component Value Date   [facility] <10 10/10/2018    Metabolic Disorder Labs:  No results found for: HGBA1C, MPG No results found for: PROLACTIN No results found for: CHOL, TRIG, HDL, CHOLHDL, VLDL, LDLCALC  Current Medications: Current  Facility-Administered Medications  Medication Dose Route Frequency Provider Last Rate Last Dose  . acetaminophen (TYLENOL) tablet 650 mg  650 mg Oral Q6H PRN Reggie Pile, MD      . FLUoxetine (PROZAC) capsule 20 mg  20 mg Oral Daily Clapacs, John T, MD      . nitrofurantoin (MACRODANTIN) capsule 100 mg  100 mg Oral QHS Clapacs, John T, MD      . prenatal vitamin w/FE, FA (PRENATAL 1 + 1) 27-1 MG tablet 1 tablet  1 tablet Oral Daily Reggie Pile, MD       PTA Medications: Medications Prior to Admission  Medication Sig Dispense Refill Last Dose  . acetaminophen (TYLENOL) 500 MG tablet Take 500-1,000 mg by mouth every 6 (six) hours as needed for mild pain, moderate pain, fever or headache.    Unknown at PRN  . Doxylamine-Pyridoxine (DICLEGIS) 10-10 MG TBEC Take 2 tablets by mouth at bedtime. If symptoms persist, add one tablet in the morning and one in the afternoon (Patient not taking: Reported on 10/10/2018) 100 tablet 5 Not Taking at Unknown time  . nitrofurantoin, macrocrystal-monohydrate, (MACROBID) 100 MG capsule Take 1 capsule (100 mg total) by mouth 2 (two) times daily. (Patient taking differently: Take 100 mg by mouth every evening. ) 14 capsule 0 Past Week at Unknown time  . Prenat-Fe Poly-Methfol-FA-DHA (VITAFOL FE+) 90-0.6-0.4-200 MG CAPS Take 1 tablet by mouth daily. 30 capsule 11 Past Week at Unknown time    Musculoskeletal: Strength & Muscle Tone: within normal limits Gait & Station: normal Patient leans: N/A  Psychiatric Specialty Exam: Physical Exam  Nursing note and vitals reviewed. Constitutional: She appears well-developed and well-nourished.  HENT:  Head: Normocephalic and atraumatic.  Eyes: Pupils are equal, round, and reactive to light. Conjunctivae are normal.  Neck: Normal range of motion.  Cardiovascular: Regular rhythm and normal heart sounds.  Respiratory: Effort normal.  GI: Soft.  Musculoskeletal: Normal range of motion.  Neurological: She is alert.  Skin:  Skin is warm and dry.  Psychiatric: Judgment normal. Her speech is delayed. She is slowed. Cognition and memory are normal. She exhibits a depressed mood. She expresses suicidal ideation. She expresses no suicidal plans.    Review of Systems  Constitutional: Negative.   HENT: Negative.   Eyes: Negative.   Respiratory: Negative.   Cardiovascular: Negative.   Gastrointestinal: Negative.   Musculoskeletal: Negative.   Skin: Negative.   Neurological: Negative.   Psychiatric/Behavioral: Positive for depression and suicidal ideas. Negative for hallucinations and substance abuse. The patient is nervous/anxious and has insomnia.     Last menstrual period 04/06/2018.There is no height or weight on file to calculate BMI.  General Appearance: Casual  Eye Contact:  Fair  Speech:  Slow  Volume:  Decreased  Mood:  Depressed  Affect:  Depressed  Thought Process:  Goal Directed  Orientation:  Full (Time, Place, and Person)  Thought Content:  Logical  Suicidal Thoughts:  Yes.  without intent/plan  Homicidal Thoughts:  No  Memory:  Immediate;   Fair Recent;   Fair Remote;   Fair  Judgement:  Fair  Insight:  Fair  Psychomotor Activity:  Decreased  Concentration:  Concentration: Fair  Recall:  [employer]Fair  Fund of Knowledge:  Fair  Language:  Fair  Akathisia:  No  Handed:  Right  AIMS (if indicated):     Assets:  Desire for Improvement Financial Resources/Insurance Housing Physical Health Resilience Social Support  ADL's:  Intact  Cognition:  WNL  Sleep:       Treatment Plan Summary: Daily contact with patient to assess and evaluate symptoms and progress in treatment, Medication management and Plan We discussed antidepressant medication.  I advised the patient that Zoloft is usually considered the recommended antidepressant of choice in pregnancy.  Patient states she does not want to take Zoloft because she saw a television commercial advertising a lawsuit related to Zoloft.  I suggested to  her that Prozac is probably the second most common and recommended antidepressant although again I emphasized that on average Zoloft is usually considered the safest.  Patient felt more comfortable with the fluoxetine.  Prozac will be initiated for treatment of depression.  15-minute checks in place.  Full assessment by treatment team.  Patient does not need to be under commitment as long as she is agreeable to treatment.  Anticipate a length of stay of probably only about 2 days.  Meanwhile continue her vitamins for her anemia and pregnant condition and her antibiotic that she is supposed to take chronically for suppression of urinary tract infection  Observation Level/Precautions:  15 minute checks  Laboratory:  UDS  Psychotherapy:    Medications:    Consultations:    Discharge Concerns:    Estimated LOS:  Other:     Physician Treatment Plan for Primary Diagnosis: Severe recurrent major depression without psychotic features (HCC) Long Term Goal(s): Improvement in symptoms so as ready for discharge  Short Term Goals: Ability to verbalize feelings will improve and Ability to disclose and discuss suicidal ideas  Physician Treatment Plan for Secondary Diagnosis: Principal Problem:   Severe recurrent major depression without psychotic features (HCC) Active Problems:   Supervision of other normal pregnancy, antepartum   Iron deficiency anemia   MDD (major depressive disorder)  Long Term Goal(s): Improvement in symptoms so as ready for discharge  Short Term Goals: Compliance with prescribed medications will improve  I certify that inpatient services furnished can reasonably be expected to improve the patient's condition.    Mordecai RasmussenJohn Clapacs, MD 4/25/20202:56 PM

## 2018-10-10 NOTE — ED Notes (Signed)
Patient placed in room 2. Patient denies SI/HI/AVH. Patient states she was having bad thoughts on hurting herself and did not want to do that so she came to hospital to get help. Patient is 6 months pregnant and states that she gets depressed when she is pregnant and has to be placed on medications.

## 2018-10-10 NOTE — ED Triage Notes (Signed)
Patient is suffering with depression. Is also pregnant. Patient states she has been having suicidal thoughts for about a month but has not acted on them. Patient with flat affect in triage, very quiet when answering questions.

## 2018-10-10 NOTE — ED Notes (Signed)
SOC recommends inpatient psych at this time.

## 2018-10-10 NOTE — ED Notes (Signed)
Report given to SOC. Camera set up in room. 

## 2018-10-10 NOTE — ED Notes (Signed)
TTS speaking with patient via video chat on computer.

## 2018-10-10 NOTE — ED Provider Notes (Signed)
[facility] Emergency Department Provider Note  ____________________________________________  Time seen: Approximately 3:09 AM  I have reviewed the triage vital signs and the nursing notes.   HISTORY  Chief Complaint Suicidal    HPI Molly Graves is a 28 y.o. female with a history of peripartum depression, currently about [redacted] weeks pregnant, who complains of depressed mood and passive suicidal thoughts for the past 4 weeks.  Denies any plans or intent to harm or kill herself.  She wants help.  No HI or hallucinations.  Symptoms are waxing and waning without aggravating or alleviating factors.  She reports depression in her third trimester with her previous pregnancy, treated with an antidepressant which she is no longer taking.    Denies any pain.  No contractions vaginal bleeding or acute leakage of fluid.  Normal fetal movements.  Past Medical History:  Diagnosis Date  . Anemia   . Depression    post partum depression  . Headache    MIGRAINES  . Herpes genitalia      Patient Active Problem List   Diagnosis Date Noted  . 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 Iron deficiency anemia 09/03/2018  . Pyelonephritis affecting pregnancy in second trimester 08/08/2018  . Supervision of other normal pregnancy, antepartum 05/22/2018  . Complication of intrauterine device (IUD) (HCC) 04/19/2016     Past Surgical History:  Procedure Laterality Date  . BREAST SURGERY     lumpectomy left breast  . DIAGNOSTIC LAPAROSCOPY  2015   to rule out uterine injury after D&E  . HYSTEROSCOPY W/D&C N/A 04/19/2016   Procedure: DILATATION AND CURETTAGE /HYSTEROSCOPY;  Surgeon: Nadara Mustard, MD;  Location: [facility] ORS;  Service: Gynecology;  Laterality: N/A;  . INDUCED ABORTION  2015  . IUD REMOVAL N/A 04/19/2016   Procedure: INTRAUTERINE DEVICE (IUD) REMOVAL;  Surgeon: Nadara Mustard, MD;  Location: [facility] ORS;  Service: Gynecology;   Laterality: N/A;  . LAPAROSCOPY N/A 04/19/2016   Procedure: LAPAROSCOPY DIAGNOSTIC;  Surgeon: Nadara Mustard, MD;  Location: [facility] ORS;  Service: Gynecology;  Laterality: N/A;     Prior to Admission medications   Medication Sig Start Date End Date Taking? Authorizing Provider  acetaminophen (TYLENOL) 500 MG tablet Take 500 mg by mouth every 6 (six) hours as needed for mild pain, moderate pain, fever or headache.    [provider]  Doxylamine-Pyridoxine (DICLEGIS) 10-10 MG TBEC Take 2 tablets by mouth at bedtime. If symptoms persist, add one tablet in the morning and one in the afternoon 07/06/18   Nadara Mustard, MD  nitrofurantoin, macrocrystal-monohydrate, (MACROBID) 100 MG capsule Take 1 capsule (100 mg total) by mouth 2 (two) times daily. 10/02/18   Conard Novak, MD  Prenat-Fe Poly-Methfol-FA-DHA (VITAFOL FE+) 90-0.6-0.4-200 MG CAPS Take 1 tablet by mouth daily. 08/26/18   Natale Milch, MD     Allergies Patient has no known allergies.   Family History  Problem Relation Age of Onset  . Hypertension Maternal Aunt   . Hypertension Maternal Grandmother   . Cancer Other 29       Cervical, Malignant  . Depression Mother   . Hypertension Mother   . Cancer Maternal Uncle   . Cancer Paternal Aunt     Social History Social History   Tobacco Use  . Smoking status: Former Smoker    Years: 7.00    Types: Cigars    Last attempt to quit: 04/17/2018    Years since quitting:  0.4  . Smokeless tobacco: Never Used  Substance Use Topics  . Alcohol use: Not Currently  . Drug use: No    Review of Systems  Constitutional:   No fever or chills.  ENT:   No sore throat. No rhinorrhea. Cardiovascular:   No chest pain or syncope. Respiratory:   No dyspnea or cough. Gastrointestinal:   Negative for abdominal pain, vomiting and diarrhea.  Musculoskeletal:   Negative for focal pain or swelling All other systems reviewed and are negative except as documented above in  ROS and HPI.  ____________________________________________   PHYSICAL EXAM:  VITAL SIGNS: ED Triage Vitals [10/10/18 0019]  Enc Vitals Group     BP 119/69     Pulse Rate 83     Resp 18     Temp 98.6 F (37 C)     Temp Source Oral     SpO2 97 %     Weight 175 lb (79.4 kg)     Height 5\' 4"  (1.626 m)     Head Circumference      Peak Flow      Pain Score 0     Pain Loc      Pain Edu?      Excl. in GC?     Vital signs reviewed, nursing assessments reviewed.   Constitutional:   Alert and oriented. Non-toxic appearance. Eyes:   Conjunctivae are normal. EOMI. PERRL. ENT      Head:   Normocephalic and atraumatic.      Nose:   No congestion/rhinnorhea.       Mouth/Throat:   MMM, no pharyngeal erythema. No peritonsillar mass.       Neck:   No meningismus. Full ROM. Hematological/Lymphatic/Immunilogical:   No cervical lymphadenopathy. Cardiovascular:   RRR. Symmetric bilateral radial and DP pulses.  No murmurs. Cap refill less than 2 seconds. Respiratory:   Normal respiratory effort without tachypnea/retractions. Breath sounds are clear and equal bilaterally. No wheezes/rales/rhonchi. Gastrointestinal:   Soft and nontender.  Gravid, size consistent with dates there is no CVA tenderness.  No rebound, rigidity, or guarding. Musculoskeletal:   Normal range of motion in all extremities. No joint effusions.  No lower extremity tenderness.  No edema. Neurologic:   Normal speech and language.  Motor grossly intact. No acute focal neurologic deficits are appreciated.  Skin:    Skin is warm, dry and intact. No rash noted.  No petechiae, purpura, or bullae.  ____________________________________________    LABS (pertinent positives/negatives) (all labs ordered are listed, but only abnormal results are displayed) Labs Reviewed  COMPREHENSIVE METABOLIC PANEL - Abnormal; Notable for the following components:      Result Value   Potassium 3.3 (*)    CO2 21 (*)    Glucose, Bld 113 (*)     Albumin 3.1 (*)    All other components within normal limits  ACETAMINOPHEN LEVEL - Abnormal; Notable for the following components:   Acetaminophen (Tylenol), Serum <10 (*)    All other components within normal limits  CBC - Abnormal; Notable for the following components:   WBC 13.7 (*)    Hemoglobin 11.0 (*)    HCT 33.6 (*)    All other components within normal limits  ETHANOL  SALICYLATE LEVEL  URINE DRUG SCREEN, QUALITATIVE ([facility] ONLY)   ____________________________________________   EKG    ____________________________________________    RADIOLOGY  No results found.  ____________________________________________   PROCEDURES Procedures  ____________________________________________    CLINICAL IMPRESSION / ASSESSMENT AND PLAN /  ED COURSE  Medications ordered in the ED: Medications - No data to display  Pertinent labs & imaging results that were available during my care of the patient were reviewed by me and considered in my medical decision making (see chart for details).  Molly Graves was evaluated in Emergency Department on 10/10/2018 for the symptoms described in the history of present illness. She was evaluated in the context of the global COVID-19 pandemic, which necessitated consideration that the patient might be at risk for infection with the SARS-CoV-2 virus that causes COVID-19. Institutional protocols and algorithms that pertain to the evaluation of patients at risk for COVID-19 are in a state of rapid change based on information released by regulatory bodies including the CDC and federal and state organizations. These policies and algorithms were followed during the patient's care in the ED.   Patient presents with symptoms of depression.  Vital signs are normal, no acute pregnancy concerns or medical complaints.  She is medically stable.   I do not think she is an acute danger to herself or others and not committable at this time.  Will obtain  psychiatry evaluation.       ____________________________________________   FINAL CLINICAL IMPRESSION(S) / ED DIAGNOSES    Final diagnoses:  Depressed mood  Third trimester pregnancy     ED Discharge Orders    None      Portions of this note were generated with dragon dictation software. Dictation errors may occur despite best attempts at proofreading.   Sharman Cheek, MD 10/10/18 [phone]

## 2018-10-10 NOTE — ED Notes (Addendum)
Patient belongings include sweat pants, tennis shoes, burgundy bra, red underwear, little black socks, and a black shirt. Brown pocket book and a hair scrunchie also placed into the bag. Two personal belongings bags in total

## 2018-10-11 MED ORDER — PYRIDOXINE HCL 25 MG PO TABS
12.5000 mg | ORAL_TABLET | Freq: Every day | ORAL | Status: DC
  Filled 2018-10-11 (×2): qty 1

## 2018-10-11 MED ORDER — VITAMIN B-6 50 MG PO TABS
25.0000 mg | ORAL_TABLET | Freq: Every day | ORAL | Status: DC
  Administered 2018-10-11 – 2018-10-12 (×2): 25 mg via ORAL
  Filled 2018-10-11 (×2): qty 0.5

## 2018-10-11 MED ORDER — DOXYLAMINE SUCCINATE (SLEEP) 25 MG PO TABS
12.5000 mg | ORAL_TABLET | Freq: Every day | ORAL | Status: DC
  Administered 2018-10-11: 22:00:00 12.5 mg via ORAL
  Filled 2018-10-11 (×2): qty 1

## 2018-10-11 MED ORDER — ONDANSETRON HCL 4 MG PO TABS
4.0000 mg | ORAL_TABLET | Freq: Four times a day (QID) | ORAL | Status: DC | PRN
  Filled 2018-10-11: qty 1

## 2018-10-11 MED ORDER — FLUOXETINE HCL 20 MG PO CAPS: 20.0000 mg | ORAL_CAPSULE | Freq: Every day | ORAL | 1 refills | Status: DC

## 2018-10-11 NOTE — Plan of Care (Signed)
Molly Graves spent most of the evening talking on the phone, she went to get a snack and took medication on shift and she was medication compliant. Cooperative with treatment, she appears to be resting in the bed quietly at this time.

## 2018-10-11 NOTE — Progress Notes (Signed)
[facility] MD Progress Note  10/11/2018 1:00 PM Molly Graves  MRN:  [id number] Subjective: Patient seen chart reviewed.  Patient says she is feeling much better today.  Tired and sick to her stomach which are normal during her pregnancy.  Denies any suicidal thought intent or plan.  Able to articulate desire to be back with her children and positive plans for the future.  No side effects evidently from medication. Principal Problem: Severe recurrent major depression without psychotic features (HCC) Diagnosis: Principal Problem:   Severe recurrent major depression without psychotic features (HCC) Active Problems:   Supervision of other normal pregnancy, antepartum   Iron deficiency anemia   MDD (major depressive disorder)  Total Time spent with patient: 30 minutes  Past Psychiatric History: Patient has a history of recurrent depression that started with pregnancy.  Has had a history of response to medicine in the past.  Past Medical History:  Past Medical History:  Diagnosis Date  . Anemia   . Depression    post partum depression  . Headache    MIGRAINES  . Herpes genitalia     Past Surgical History:  Procedure Laterality Date  . BREAST SURGERY     lumpectomy left breast  . DIAGNOSTIC LAPAROSCOPY  2015   to rule out uterine injury after D&E  . HYSTEROSCOPY W/D&C N/A 04/19/2016   Procedure: DILATATION AND CURETTAGE /HYSTEROSCOPY;  Surgeon: Nadara Mustard, MD;  Location: [facility] ORS;  Service: Gynecology;  Laterality: N/A;  . INDUCED ABORTION  2015  . IUD REMOVAL N/A 04/19/2016   Procedure: INTRAUTERINE DEVICE (IUD) REMOVAL;  Surgeon: Nadara Mustard, MD;  Location: [facility] ORS;  Service: Gynecology;  Laterality: N/A;  . LAPAROSCOPY N/A 04/19/2016   Procedure: LAPAROSCOPY DIAGNOSTIC;  Surgeon: Nadara Mustard, MD;  Location: [facility] ORS;  Service: Gynecology;  Laterality: N/A;   Family History:  Family History  Problem Relation Age of Onset  . Hypertension Maternal Aunt   . Hypertension  Maternal Grandmother   . Cancer Other 29       Cervical, Malignant  . Depression Mother   . Hypertension Mother   . Cancer Maternal Uncle   . Cancer Paternal Aunt    Family Psychiatric  History: See previous but essentially multiple members of the family who are women have had depression Social History:  Social History   Substance and Sexual Activity  Alcohol Use Not Currently     Social History   Substance and Sexual Activity  Drug Use No    Social History   Socioeconomic History  . Marital status: Single    Spouse name: Not on file  . Number of children: Not on file  . Years of education: Not on file  . Highest education level: Not on file  Occupational History  . Not on file  Social Needs  . Financial resource strain: Not on file  . Food insecurity:    Worry: Not on file    Inability: Not on file  . Transportation needs:    Medical: Not on file    Non-medical: Not on file  Tobacco Use  . Smoking status: Former Smoker    Years: 7.00    Types: Cigars    Last attempt to quit: 04/17/2018    Years since quitting: 0.4  . Smokeless tobacco: Never Used  Substance and Sexual Activity  . Alcohol use: Not Currently  . Drug use: No  . Sexual activity: Yes    Partners: Male    Birth  control/protection: None  Lifestyle  . Physical activity:    Days per week: 6 days    Minutes per session: Not on file  . Stress: To some extent  Relationships  . Social connections:    Talks on phone: More than three times a week    Gets together: More than three times a week    Attends religious service: Never    Active member of club or organization: Yes    Attends meetings of clubs or organizations: Never    Relationship status: Living with partner  Other Topics Concern  . Not on file  Social History Narrative  . Not on file   Additional Social History:                         Sleep: Fair  Appetite:  Fair  Current Medications: Current Facility-Administered  Medications  Medication Dose Route Frequency Provider Last Rate Last Dose  . acetaminophen (TYLENOL) tablet 650 mg  650 mg Oral Q6H PRN Reggie PileJoshi, Anand, MD      . doxylamine (Sleep) (UNISOM) tablet 12.5 mg  12.5 mg Oral QHS Kashana Breach T, MD      . FLUoxetine (PROZAC) capsule 20 mg  20 mg Oral Daily Kobe Ofallon, Jackquline DenmarkJohn T, MD   20 mg at 10/11/18 [id number]  . nitrofurantoin (MACRODANTIN) capsule 100 mg  100 mg Oral QHS Lavina Resor, Jackquline DenmarkJohn T, MD   100 mg at 10/10/18 2146  . ondansetron (ZOFRAN) tablet 4 mg  4 mg Oral Q6H PRN Bonnell Placzek T, MD      . prenatal vitamin w/FE, FA (PRENATAL 1 + 1) 27-1 MG tablet 1 tablet  1 tablet Oral Daily Reggie PileJoshi, Anand, MD   1 tablet at 10/11/18 [phone]  . pyridOXINE (VITAMIN B-6) tablet 12.5 mg  12.5 mg Oral Daily Graves Nipp, Jackquline DenmarkJohn T, MD        Lab Results:  Results for orders placed or performed during the hospital encounter of 10/10/18 (from the past 48 hour(s))  Comprehensive metabolic panel     Status: Abnormal   Collection Time: 10/10/18 12:30 AM  Result Value Ref Range   Sodium 135 135 - 145 mmol/L   Potassium 3.3 (L) 3.5 - 5.1 mmol/L   Chloride 105 98 - 111 mmol/L   CO2 21 (L) 22 - 32 mmol/L   Glucose, Bld 113 (H) 70 - 99 mg/dL   BUN 8 6 - 20 mg/dL   Creatinine, Ser 5.400.57 0.44 - 1.00 mg/dL   Calcium 9.0 8.9 - 98.110.3 mg/dL   Total Protein 6.8 6.5 - 8.1 g/dL   Albumin 3.1 (L) 3.5 - 5.0 g/dL   AST 16 15 - 41 U/L   ALT 15 0 - 44 U/L   Alkaline Phosphatase 87 38 - 126 U/L   Total Bilirubin 0.5 0.3 - 1.2 mg/dL   GFR calc non Af Amer >60 >60 mL/min   GFR calc Af Amer >60 >60 mL/min   Anion gap 9 5 - 15    Comment: Performed at [facility], [address] [place], [place] [address]  Ethanol     Status: None   Collection Time: 10/10/18 12:30 AM  Result Value Ref Range   Alcohol, Ethyl (B) <10 <10 mg/dL    Comment: (NOTE) Lowest detectable limit for serum alcohol is 10 mg/dL. For medical purposes only. Performed at [facility], [address]  [place], [place] [address]   Salicylate level  Status: None   Collection Time: 10/10/18 12:30 AM  Result Value Ref Range   Salicylate Lvl <7.0 2.8 - 30.0 mg/dL    Comment: Performed at [facility], [address] [place], [place] [address]  Acetaminophen level     Status: Abnormal   Collection Time: 10/10/18 12:30 AM  Result Value Ref Range   Acetaminophen (Tylenol), Serum <10 (L) 10 - 30 ug/mL    Comment: (NOTE) Therapeutic concentrations vary significantly. A range of 10-30 ug/mL  may be an effective concentration for many patients. However, some  are best treated at concentrations outside of this range. Acetaminophen concentrations >150 ug/mL at 4 hours after ingestion  and >50 ug/mL at 12 hours after ingestion are often associated with  toxic reactions. Performed at [facility], [address] [place], [place] [address]   cbc     Status: Abnormal   Collection Time: 10/10/18 12:30 AM  Result Value Ref Range   WBC 13.7 (H) 4.0 - 10.5 K/uL   RBC 3.94 3.87 - 5.11 MIL/uL   Hemoglobin 11.0 (L) 12.0 - 15.0 g/dL   HCT 09.8 (L) 11.9 - 14.7 %   MCV 85.3 80.0 - 100.0 fL   MCH 27.9 26.0 - 34.0 pg   MCHC 32.7 30.0 - 36.0 g/dL   RDW 82.9 56.2 - 13.0 %   Platelets 281 150 - 400 K/uL   nRBC 0.0 0.0 - 0.2 %    Comment: Performed at [facility], [address] [place], [place] [address]  Urine Drug Screen, Qualitative     Status: None   Collection Time: 10/10/18 12:30 AM  Result Value Ref Range   Tricyclic, Ur Screen NONE DETECTED NONE DETECTED   Amphetamines, Ur Screen NONE DETECTED NONE DETECTED   MDMA (Ecstasy)Ur Screen NONE DETECTED NONE DETECTED   Cocaine Metabolite,Ur [place] NONE DETECTED NONE DETECTED   Opiate, Ur Screen NONE DETECTED NONE DETECTED   Phencyclidine (PCP) Ur S NONE DETECTED NONE DETECTED   Cannabinoid 50 Ng, Ur  NONE DETECTED NONE DETECTED   Barbiturates, Ur Screen NONE DETECTED NONE DETECTED   Benzodiazepine, Ur Scrn NONE DETECTED  NONE DETECTED   Methadone Scn, Ur NONE DETECTED NONE DETECTED    Comment: (NOTE) Tricyclics + metabolites, urine    Cutoff 1000 ng/mL Amphetamines + metabolites, urine  Cutoff 1000 ng/mL MDMA (Ecstasy), urine              Cutoff 500 ng/mL Cocaine Metabolite, urine          Cutoff 300 ng/mL Opiate + metabolites, urine        Cutoff 300 ng/mL Phencyclidine (PCP), urine         Cutoff 25 ng/mL Cannabinoid, urine                 Cutoff 50 ng/mL Barbiturates + metabolites, urine  Cutoff 200 ng/mL Benzodiazepine, urine              Cutoff 200 ng/mL Methadone, urine                   Cutoff 300 ng/mL The urine drug screen provides only a preliminary, unconfirmed analytical test result and should not be used for non-medical purposes. Clinical consideration and professional judgment should be applied to any positive drug screen result due to possible interfering substances. A more specific alternate chemical method must be used in order to obtain a confirmed analytical result. Gas chromatography / mass spectrometry (GC/MS) is the preferred  confirmat ory method. Performed at [facility], [address] [place], [place] [address]   Pregnancy, urine     Status: Abnormal   Collection Time: 10/10/18 12:30 AM  Result Value Ref Range   Preg Test, Ur POSITIVE (A) NEGATIVE    Comment: Performed at [facility], [address] [place], [place] [address]    Blood Alcohol level:  Lab Results  Component Value Date   [facility] <10 10/10/2018    Metabolic Disorder Labs: No results found for: HGBA1C, MPG No results found for: PROLACTIN No results found for: CHOL, TRIG, HDL, CHOLHDL, VLDL, LDLCALC  Physical Findings: AIMS:  , ,  ,  ,    CIWA:    COWS:     Musculoskeletal: Strength & Muscle Tone: within normal limits Gait & Station: normal Patient leans: N/A  Psychiatric Specialty Exam: Physical Exam  Nursing note and vitals reviewed. Constitutional: She appears  well-developed and well-nourished.  HENT:  Head: Normocephalic and atraumatic.  Eyes: Pupils are equal, round, and reactive to light. Conjunctivae are normal.  Neck: Normal range of motion.  Cardiovascular: Regular rhythm and normal heart sounds.  Respiratory: Effort normal. No respiratory distress.  GI: Soft.  Musculoskeletal: Normal range of motion.  Neurological: She is alert.  Skin: Skin is warm and dry.  Psychiatric: She has a normal mood and affect. Her speech is normal and behavior is normal. Judgment and thought content normal. Cognition and memory are normal.    Review of Systems  Constitutional: Negative.   HENT: Negative.   Eyes: Negative.   Respiratory: Negative.   Cardiovascular: Negative.   Gastrointestinal: Negative.   Musculoskeletal: Negative.   Skin: Negative.   Neurological: Negative.   Psychiatric/Behavioral: Negative.     Blood pressure 113/67, pulse 90, temperature 98.2 F (36.8 C), temperature source Oral, resp. rate 18, height  (1.626 m), weight 79.4 kg, last menstrual period 04/06/2018, SpO2 98 %.Body mass index is 30.04 kg/m.  General Appearance: Casual  Eye Contact:  Good  Speech:  Clear and Coherent  Volume:  Normal  Mood:  Euthymic  Affect:  Congruent  Thought Process:  Goal Directed  Orientation:  Full (Time, Place, and Person)  Thought Content:  Logical  Suicidal Thoughts:  No  Homicidal Thoughts:  No  Memory:  Immediate;   Fair Recent;   Fair Remote;   Fair  Judgement:  Fair  Insight:  Fair  Psychomotor Activity:  Normal  Concentration:  Concentration: Fair  Recall:  [employer] of Knowledge:  Fair  Language:  Fair  Akathisia:  No  Handed:  Right  AIMS (if indicated):     Assets:  Desire for Improvement Housing Physical Health Resilience Social Support  ADL's:  Intact  Cognition:  WNL  Sleep:  Number of Hours: 6.75     Treatment Plan Summary: Daily contact with patient to assess and evaluate symptoms and progress in  treatment, Medication management and Plan Patient is tolerating fluoxetine.  She takes a chronic anti-nausea preparation for morning sickness at home.  The hospital does not carry the brand name but I can re-create it with low doses of its individual components.  Zofran also written as a as needed for nausea.  Encourage her to eat and drink normally.  No change to current medicine.  Patient appears to be safe in returning to her baseline and is agreeable to outpatient treatment.  Likely discharge tomorrow.  Mordecai Rasmussen, MD 10/11/2018, 1:00 PM

## 2018-10-11 NOTE — [facility] Suicide Risk Assessment (Signed)
[facility] INPATIENT:  Family/Significant Other Suicide Prevention Education  Suicide Prevention Education:  Patient Refusal for Family/Significant Other Suicide Prevention Education: The patient Molly Graves has refused to provide written consent for family/significant other to be provided Family/Significant Other Suicide Prevention Education during admission and/or prior to discharge.  Physician notified.  Willistine Ferrall  CUEBAS-COLON 10/11/2018, 12:27 PM
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
## 2018-10-11 NOTE — Plan of Care (Signed)
Denies SI/HI/AVH. Has completed self-inventory today. Reports 5/10 depression. Goal for today is "getting out of here." Safety is maintained on the unit and 15 minute safety rounds are completed. Patient is adherent with scheduled medications. No complaints at this time.   Problem: Education: Goal: Knowledge of the prescribed therapeutic regimen will improve Outcome: Progressing   Problem: Coping: Goal: Will verbalize feelings Outcome: Progressing

## 2018-10-11 NOTE — BHH Suicide Risk Assessment (Signed)
[facility] Discharge Suicide Risk Assessment   Principal Problem: Severe recurrent major depression without psychotic features [facility]) Discharge Diagnoses: Principal Problem:   Severe recurrent major depression without psychotic features (HCC) Active Problems:   Supervision of other normal pregnancy, antepartum   Iron deficiency anemia   MDD (major depressive disorder)   Total Time spent with patient: 45 minutes  Musculoskeletal: Strength & Muscle Tone: within normal limits Gait & Station: normal Patient leans: N/A  Psychiatric Specialty Exam: Review of Systems  Constitutional: Negative.   HENT: Negative.   Eyes: Negative.   Respiratory: Negative.   Cardiovascular: Negative.   Gastrointestinal: Negative.   Musculoskeletal: Negative.   Skin: Negative.   Neurological: Negative.   Psychiatric/Behavioral: Negative.     Blood pressure 113/67, pulse 90, temperature 98.2 F (36.8 C), temperature source Oral, resp. rate 18, height 5\' 4"  (1.626 m), weight 79.4 kg, last menstrual period 04/06/2018, SpO2 98 %.Body mass index is 30.04 kg/m.  General Appearance: Casual  Eye Contact::  Good  Speech:  Clear and Coherent409  Volume:  Normal  Mood:  Euthymic  Affect:  Congruent  Thought Process:  Goal Directed  Orientation:  Full (Time, Place, and Person)  Thought Content:  Logical  Suicidal Thoughts:  No  Homicidal Thoughts:  No  Memory:  Immediate;   Fair Recent;   Fair Remote;   Fair  Judgement:  Fair  Insight:  Fair  Psychomotor Activity:  Normal  Concentration:  Fair  Recall:  [employer] of Knowledge:Fair  Language: Fair  Akathisia:  No  Handed:  Right  AIMS (if indicated):     Assets:  Desire for Improvement Housing Physical Health Resilience Social Support  Sleep:  Number of Hours: 6.75  Cognition: WNL  ADL's:  Intact   Mental Status Per Nursing Assessment::   On Admission:     Demographic Factors:  Adolescent or young adult  Loss Factors: Financial  problems/change in socioeconomic status  Historical Factors: Impulsivity  Risk Reduction Factors:   Pregnancy, Responsible for children under 59 years of age, Sense of responsibility to family, Religious beliefs about death, Employed, Living with another person, especially a relative, Positive social support and Positive therapeutic relationship  Continued Clinical Symptoms:  Depression:   Impulsivity  Cognitive Features That Contribute To Risk:  Thought constriction (tunnel vision)    Suicide Risk:  Minimal: No identifiable suicidal ideation.  Patients presenting with no risk factors but with morbid ruminations; may be classified as minimal risk based on the severity of the depressive symptoms    Plan Of Care/Follow-up recommendations:  Activity:  Activity as tolerated Diet:  Regular diet Other:  Follow-up with OB/GYN and also with mental health referral for outpatient treatment and continue medication along with therapy and psychiatric follow-up.  Mordecai Rasmussen, MD 10/11/2018, 1:04 PM

## 2018-10-11 NOTE — BHH Group Notes (Signed)
LCSW Group Therapy Note 10/11/2018 1:15pm  Type of Therapy and Topic: Group Therapy: Feelings Around Returning Home & Establishing a Supportive Framework and Supporting Oneself When Supports Not Available  Participation Level: Active  Description of Group:  Patients first processed thoughts and feelings about upcoming discharge. These included fears of upcoming changes, lack of change, new living environments, judgements and expectations from others and overall stigma of mental health issues. The group then discussed the definition of a supportive framework, what that looks and feels like, and how do to discern it from an unhealthy non-supportive network. The group identified different types of supports as well as what to do when your family/friends are less than helpful or unavailable  Therapeutic Goals  1. Patient will identify one healthy supportive network that they can use at discharge. 2. Patient will identify one factor of a supportive framework and how to tell it from an unhealthy network. 3. Patient able to identify one coping skill to use when they do not have positive supports from others. 4. Patient will demonstrate ability to communicate their needs through discussion and/or role plays.  Summary of Patient Progress:  The patient reported she feels "fine today." Pt engaged during group session. As patients processed their anxiety about discharge and described healthy supports patient shared she is ready to be discharge. She stated, "my mind is clear and I miss my kids." She listed her family as her main support.  Patients identified at least one self-care tool they were willing to use after discharge reading and watching videos.   Therapeutic Modalities Cognitive Behavioral Therapy Motivational Interviewing   Molly Graves  CUEBAS-COLON, LCSW 10/11/2018 11:49 AM

## 2018-10-12 NOTE — Progress Notes (Signed)
Patient denies SI/HI, denies A/V hallucinations. Patient verbalizes understanding of discharge instructions, follow up care and prescriptions. Patient given all belongings from [facility] locker. Patient escorted out by staff to the medical mall for her car at the ED parking lot.

## 2018-10-12 NOTE — Progress Notes (Signed)
D- Patient alert and oriented. Appears in pleasant mood and temperament. Denies SI, HI, AVH, and pain.  Goal: hope not to experience complications to full term of pregnancyl. A- Scheduled medications administered to patient, per MD orders. Support and encouragement provided.  Routine safety checks conducted every 15 minutes.  Patient informed to notify staff with problems or concerns. R- No adverse drug reactions noted. Patient contracts for safety at this time. Patient compliant with medications and treatment plan. Patient receptive, calm, and cooperative. Patient interacts well with others on the unit.  Patient remains safe at this time.

## 2018-10-12 NOTE — Progress Notes (Signed)
  [facility] Adult Case Management Discharge Plan :  Will you be returning to the same living situation after discharge:  Yes,  lives with relatives At discharge, do you have transportation home?: Yes,  pt states her car is on campus Do you have the ability to pay for your medications: Yes,  insurance  Release of information consent forms completed and in the chart;  Patient's signature needed at discharge.  Patient to Follow up at: Follow-up Information    [facility], [employer]. Go on 10/15/2018.   Why:  You are scheduled to follow up at [facility] on Thursday, October 15, 2018 at 10am. Arrive 15 min early and call when you arrive to complete intake. Bring photo ID, insurance card, hospital discharge. Thank you. Contact information: [address] [place] [address] [phone]           Next level of care provider has access to [facility] Link:no  Safety Planning and Suicide Prevention discussed: Yes,  with pt; pt declined family contact     Has patient been referred to the Quitline?: N/A patient is not a smoker  Patient has been referred for addiction treatment: N/A  Suzan Slick, LCSW 10/12/2018, 10:11 AM

## 2018-10-12 NOTE — Discharge Summary (Signed)
Physician Discharge Summary Note  Patient:  Molly Graves is an 28 y.o., female MRN:  [id number] DOB:  September 13, 1990 Patient phone:  [phone] (home)  Patient address:   [address] [place] [address],  Total Time spent with patient: 1 hour  Date of Admission:  10/10/2018 Date of Discharge: October 12, 2018  Reason for Admission: Admitted through the emergency room where she presented with having suicidal thoughts in the context of worsening depression  Principal Problem: Severe recurrent major depression without psychotic features [facility]) Discharge Diagnoses: Principal Problem:   Severe recurrent major depression without psychotic features (HCC) Active Problems:   Supervision of other normal pregnancy, antepartum   Iron deficiency anemia   MDD (major depressive disorder)   Past Psychiatric History: Patient has a history of depression with previous pregnancy and has been having worsening symptoms of depression this time.  No previous suicide attempts.  She previously had good response to antidepressant medicine.  Past Medical History:  Past Medical History:  Diagnosis Date  . Anemia   . Depression    post partum depression  . Headache    MIGRAINES  . Herpes genitalia     Past Surgical History:  Procedure Laterality Date  . BREAST SURGERY     lumpectomy left breast  . DIAGNOSTIC LAPAROSCOPY  2015   to rule out uterine injury after D&E  . HYSTEROSCOPY W/D&C N/A 04/19/2016   Procedure: DILATATION AND CURETTAGE /HYSTEROSCOPY;  Surgeon: Nadara Mustard, MD;  Location: [facility] ORS;  Service: Gynecology;  Laterality: N/A;  . INDUCED ABORTION  2015  . IUD REMOVAL N/A 04/19/2016   Procedure: INTRAUTERINE DEVICE (IUD) REMOVAL;  Surgeon: Nadara Mustard, MD;  Location: [facility] ORS;  Service: Gynecology;  Laterality: N/A;  . LAPAROSCOPY N/A 04/19/2016   Procedure: LAPAROSCOPY DIAGNOSTIC;  Surgeon: Nadara Mustard, MD;  Location: [facility] ORS;  Service: Gynecology;  Laterality: N/A;    Family History:  Family History  Problem Relation Age of Onset  . Hypertension Maternal Aunt   . Hypertension Maternal Grandmother   . Cancer Other 29       Cervical, Malignant  . Depression Mother   . Hypertension Mother   . Cancer Maternal Uncle   . Cancer Paternal Aunt    Family Psychiatric  History: Extensive family history of depression Social History:  Social History   Substance and Sexual Activity  Alcohol Use Not Currently     Social History   Substance and Sexual Activity  Drug Use No    Social History   Socioeconomic History  . Marital status: Single    Spouse name: Not on file  . Number of children: Not on file  . Years of education: Not on file  . Highest education level: Not on file  Occupational History  . Not on file  Social Needs  . Financial resource strain: Not on file  . Food insecurity:    Worry: Not on file    Inability: Not on file  . Transportation needs:    Medical: Not on file    Non-medical: Not on file  Tobacco Use  . Smoking status: Former Smoker    Years: 7.00    Types: Cigars    Last attempt to quit: 04/17/2018    Years since quitting: 0.4  . Smokeless tobacco: Never Used  Substance and Sexual Activity  . Alcohol use: Not Currently  . Drug use: No  . Sexual activity: Yes    Partners: Male  Birth control/protection: None  Lifestyle  . Physical activity:    Days per week: 6 days    Minutes per session: Not on file  . Stress: To some extent  Relationships  . Social connections:    Talks on phone: More than three times a week    Gets together: More than three times a week    Attends religious service: Never    Active member of club or organization: Yes    Attends meetings of clubs or organizations: Never    Relationship status: Living with partner  Other Topics Concern  . Not on file  Social History Narrative  . Not on file    Hospital Course: Patient was cooperative agreeable and showed good insight.  We  discussed medication options risks and benefits and the patient agreed to starting Prozac initially at 20 mg/day.  Side effects discussed.  Patient tolerated medicine without difficulty.  Reported mood was much better.  Denied suicidal ideation during the hospital stay and did not show any dangerous or suicidal behavior.  Patient was discharged home with [facility] of depression.  Physical Findings: AIMS:  , ,  ,  ,    CIWA:    COWS:     Musculoskeletal: Strength & Muscle Tone: within normal limits Gait & Station: normal Patient leans: N/A  Psychiatric Specialty Exam: Physical Exam  Nursing note and vitals reviewed. Constitutional: She appears well-developed and well-nourished.  HENT:  Head: Normocephalic and atraumatic.  Eyes: Pupils are equal, round, and reactive to light. Conjunctivae are normal.  Neck: Normal range of motion.  Cardiovascular: Regular rhythm and normal heart sounds.  Respiratory: Effort normal.  GI: Soft.  Musculoskeletal: Normal range of motion.  Neurological: She is alert.  Skin: Skin is warm and dry.  Psychiatric: She has a normal mood and affect. Her behavior is normal. Judgment and thought content normal.    Review of Systems  Constitutional: Negative.   HENT: Negative.   Eyes: Negative.   Respiratory: Negative.   Cardiovascular: Negative.   Gastrointestinal: Negative.   Musculoskeletal: Negative.   Skin: Negative.   Neurological: Negative.   Psychiatric/Behavioral: Negative.     Blood pressure (!) 108/59, pulse 75, temperature 98.1 F (36.7 C), temperature source Oral, resp. rate 18, height 5\' 4"  (1.626 m), weight 79.4 kg, last menstrual period 04/06/2018, SpO2 100 %.Body mass index is 30.04 kg/m.  General Appearance: Casual  Eye Contact:  Good  Speech:  Clear and Coherent  Volume:  Normal  Mood:  Euthymic  Affect:  Congruent  Thought Process:  Goal Directed  Orientation:  Full  (Time, Place, and Person)  Thought Content:  Logical  Suicidal Thoughts:  No  Homicidal Thoughts:  No  Memory:  Immediate;   Fair Recent;   Fair Remote;   Fair  Judgement:  Fair  Insight:  Good  Psychomotor Activity:  Decreased  Concentration:  Concentration: Fair  Recall:  [employer]  Fund of Knowledge:  Fair  Language:  Fair  Akathisia:  No  Handed:  Right  AIMS (if indicated):     Assets:  Desire for Improvement Housing Physical Health Resilience Social Support  ADL's:  Intact  Cognition:  WNL  Sleep:  Number of Hours: 6.75        Has this patient used any form of tobacco in the last 30 days? (Cigarettes, Smokeless Tobacco, Cigars, and/or Pipes) Yes, No  Blood Alcohol level:  Lab Results  Component  Value Date   ETH <10 10/10/2018    Metabolic Disorder Labs:  No results found for: HGBA1C, MPG No results found for: PROLACTIN No results found for: CHOL, TRIG, HDL, CHOLHDL, VLDL, LDLCALC  See Psychiatric Specialty Exam and Suicide Risk Assessment completed by Attending Physician prior to discharge.  Discharge destination:  Home  Is patient on multiple antipsychotic therapies at discharge:  No   Has Patient had three or more failed trials of antipsychotic monotherapy by history:  No  Recommended Plan for Multiple Antipsychotic Therapies: NA  Discharge Instructions    Diet - low sodium heart healthy   Complete by:  As directed    Increase activity slowly   Complete by:  As directed      Allergies as of 10/12/2018   No Known Allergies     Medication List    STOP taking these medications   acetaminophen 500 MG tablet Commonly known as:  TYLENOL     TAKE these medications     Indication  Doxylamine-Pyridoxine 10-10 MG Tbec Commonly known as:  Diclegis Take 2 tablets by mouth at bedtime. If symptoms persist, add one tablet in the morning and one in the afternoon  Indication:  Nausea and Vomiting in Pregnancy   FLUoxetine 20 MG capsule Commonly known as:   PROZAC Take 1 capsule (20 mg total) by mouth daily.  Indication:  Depression   nitrofurantoin (macrocrystal-monohydrate) 100 MG capsule Commonly known as:  MACROBID Take 1 capsule (100 mg total) by mouth 2 (two) times daily. What changed:  when to take this  Indication:  Urinary Tract Infection   Vitafol FE+ 90-0.6-0.4-200 MG Caps Take 1 tablet by mouth daily.  Indication:  Anemia and pregnancy      Follow-up Information    [facility], [employer]. Go on 10/15/2018.   Why:  You are scheduled to follow up at [facility] on Thursday, October 15, 2018 at 10am. Arrive 15 min early and call when you arrive to complete intake. Bring photo ID, insurance card, hospital discharge. Thank you. Contact information: [address] [place] [address] [phone]           Follow-up recommendations:  Activity:  Activity as tolerated Diet:  Regular diet Other:  Prescriptions given and outpatient arrangements made for follow-up at [facility]  Comments: Continue medicine to suppress urinary tract infection as well as vitamins and iron.  Signed: Mordecai Rasmussen, MD 10/12/2018, 5:32 PM

## 2018-10-12 NOTE — Progress Notes (Signed)
Recreation Therapy Notes   Date: 10/12/2018  Time: 9:30 am   Location: Craft room   Behavioral response: N/A   Intervention Topic: Anger Management  Discussion/Intervention: Patient did not attend group.   Clinical Observations/Feedback:  Patient did not attend group.   Isaiyah Feldhaus LRT/CTRS         Zarielle Cea 10/12/2018 10:35 AM

## 2018-10-12 NOTE — Plan of Care (Signed)
Patient cooperative, pleasant able to tolerate medication regimen and provided medicines without adverse reaction nor complications. Observed to interact with other patients in earlier evening with pleasant demeanor. Expressed willingness to improve compliance with medicine and physical activity as well as prenatal care. Able to modify responses that produce anxiety.  Problem: Education: Goal: Utilization of techniques to improve thought processes will improve Outcome: Progressing Goal: Knowledge of the prescribed therapeutic regimen will improve Outcome: Progressing   Problem: Activity: Goal: Interest or engagement in leisure activities will improve Outcome: Progressing   Problem: Coping: Goal: Coping ability will improve Outcome: Progressing   Problem: Health Behavior/Discharge Planning: Goal: Compliance with therapeutic regimen will improve Outcome: Progressing   Problem: Self-Concept: Goal: Ability to modify response to factors that promote anxiety will improve Outcome: Progressing

## 2018-10-19 ENCOUNTER — Encounter: Payer: Self-pay | Admitting: Obstetrics and Gynecology

## 2018-10-19 ENCOUNTER — Other Ambulatory Visit: Payer: Self-pay

## 2018-10-19 ENCOUNTER — Ambulatory Visit (INDEPENDENT_AMBULATORY_CARE_PROVIDER_SITE_OTHER): Payer: Medicaid Other | Admitting: Obstetrics and Gynecology

## 2018-10-19 ENCOUNTER — Ambulatory Visit (INDEPENDENT_AMBULATORY_CARE_PROVIDER_SITE_OTHER): Payer: Medicaid Other

## 2018-10-19 VITALS — BP 108/58 | Wt 178.0 lb

## 2018-10-19 DIAGNOSIS — O2302 Infections of kidney in pregnancy, second trimester: Secondary | ICD-10-CM

## 2018-10-19 DIAGNOSIS — O2303 Infections of kidney in pregnancy, third trimester: Secondary | ICD-10-CM

## 2018-10-19 DIAGNOSIS — Z3480 Encounter for supervision of other normal pregnancy, unspecified trimester: Secondary | ICD-10-CM

## 2018-10-19 DIAGNOSIS — Z362 Encounter for other antenatal screening follow-up: Secondary | ICD-10-CM | POA: Diagnosis not present

## 2018-10-19 DIAGNOSIS — Z3A28 28 weeks gestation of pregnancy: Secondary | ICD-10-CM

## 2018-10-19 LAB — POCT URINALYSIS DIPSTICK OB: Glucose, UA: NEGATIVE

## 2018-10-19 NOTE — Progress Notes (Signed)
Routine Prenatal Care Visit  Subjective  Molly Graves is a 28 y.o. [phone] at 2936w0d being seen today for ongoing prenatal care.  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  ----------------------------------------------------------------------------------- Patient reports contractions occuring at night..   Contractions: Irregular. Vag. Bleeding: None.  Movement: Present. Denies leaking of fluid.  ----------------------------------------------------------------------------------- The following portions of the patient's history were reviewed and updated as appropriate: allergies, current medications, past family history, past medical history, past social history, past surgical history and problem list. Problem list updated.   Objective  Blood pressure (!) 108/58, weight 178 lb (80.7 kg), last menstrual period 04/06/2018. Pregravid weight 164 lb (74.4 kg) Total Weight Gain 14 lb (6.35 kg) Urinalysis:      Fetal Status: Fetal Heart Rate (bpm): 135   Movement: Present     General:  Alert, oriented and cooperative. Patient is in no acute distress.  Skin: Skin is warm and dry. No rash noted.   Cardiovascular: Normal heart rate noted  Respiratory: Normal respiratory effort, no problems with respiration noted  Abdomen: Soft, gravid, appropriate for gestational age. Pain/Pressure: Present     Pelvic:  Cervical exam deferred        Extremities: Normal range of motion.  Edema: None  Mental Status: Normal mood and affect. Normal behavior. Normal judgment and thought content.     Assessment    28 y.o. 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 10/02/2018 by Conard NovakJackson, Stephen D, MD No   Supervision of other normal pregnancy, antepartum 05/22/2018 by Oswaldo ConroySchmid, Jacelyn Y, CNM No   Overview Addendum 08/26/2018  4:59 PM by Natale MilchSchuman, Christanna R, MD    Clinic [facility] Prenatal Labs  Dating US Blood type: O/Positive/-- (12/23 1605)   Genetic Screen declines Antibody:Negative (12/23 1605)  Anatomic US  incomplete Rubella: 1.86 (12/23 1605) Varicella:    GTT Third trimester:  RPR: Non Reactive (12/23 1605)   Rhogam O+ HBsAg: Negative (12/23 1605)   TDaP vaccine              Flu Shot: HIV: Non Reactive (12/23 1605)   Baby Food                                GBS: pending  Contraception Declines BTL, IUD.  Undecided XBJ:YNWGNPap:needs PP  CBB     CS/VBAC na   Support Person                  Gestational age appropriate obstetric precautions including but not limited to vaginal bleeding, contractions, leaking of fluid and fetal movement were reviewed in detail with the patient.    Taking macrobid Taking prozac, mood is improved. She has not followed with therapist yet. "I need to reschedule my visit" Will need 28 wk labs this week Tdap is out today in office, perform at next visit. Reports contractions, but this is occurring after intercourse. Can not do FFN today. No hx  of preterm birth. Advised withdrawal or condoms to prevent. Will return if this continues after refraining form sex for 24 hours.  PHQ- 5 EDPS- 8 Note sent to [facility] regarding follow up.  Return in about 2 weeks (around 11/02/2018) for 28 wk labs this week - ROB telephonein 2 weeks.  Natale Milch MD [facility], [facility] 10/19/2018, 4:41 PM

## 2018-10-19 NOTE — Progress Notes (Signed)
ROB C/o contractions every night sense Friday  Denies vb, some clear fluid but not a lot at all, good FM

## 2018-10-21 ENCOUNTER — Other Ambulatory Visit: Payer: Medicaid Other

## 2018-10-28 ENCOUNTER — Other Ambulatory Visit: Payer: Self-pay

## 2018-10-28 ENCOUNTER — Inpatient Hospital Stay: Payer: Medicaid Other | Attending: Oncology

## 2018-10-28 DIAGNOSIS — D509 Iron deficiency anemia, unspecified: Secondary | ICD-10-CM | POA: Insufficient documentation

## 2018-10-28 DIAGNOSIS — Z79899 Other long term (current) drug therapy: Secondary | ICD-10-CM | POA: Diagnosis not present

## 2018-10-28 DIAGNOSIS — O99012 Anemia complicating pregnancy, second trimester: Secondary | ICD-10-CM | POA: Diagnosis present

## 2018-10-28 LAB — CBC WITH DIFFERENTIAL/PLATELET
Abs Immature Granulocytes: 0.2 10*3/uL — ABNORMAL HIGH (ref 0.00–0.07)
Basophils Absolute: 0 10*3/uL (ref 0.0–0.1)
Basophils Relative: 0 %
Eosinophils Absolute: 0.2 10*3/uL (ref 0.0–0.5)
Eosinophils Relative: 3 %
HCT: 35 % — ABNORMAL LOW (ref 36.0–46.0)
Hemoglobin: 11.2 g/dL — ABNORMAL LOW (ref 12.0–15.0)
Immature Granulocytes: 2 %
Lymphocytes Relative: 18 %
Lymphs Abs: 1.6 10*3/uL (ref 0.7–4.0)
MCH: 27.4 pg (ref 26.0–34.0)
MCHC: 32 g/dL (ref 30.0–36.0)
MCV: 85.6 fL (ref 80.0–100.0)
Monocytes Absolute: 0.9 10*3/uL (ref 0.1–1.0)
Monocytes Relative: 9 %
Neutro Abs: 6.2 10*3/uL (ref 1.7–7.7)
Neutrophils Relative %: 68 %
Platelets: 219 10*3/uL (ref 150–400)
RBC: 4.09 MIL/uL (ref 3.87–5.11)
RDW: 14.4 % (ref 11.5–15.5)
WBC: 9.1 10*3/uL (ref 4.0–10.5)
nRBC: 0 % (ref 0.0–0.2)

## 2018-10-28 LAB — IRON AND TIBC
Iron: 24 ug/dL — ABNORMAL LOW (ref 28–170)
Saturation Ratios: 5 % — ABNORMAL LOW (ref 10.4–31.8)
TIBC: 486 ug/dL — ABNORMAL HIGH (ref 250–450)
UIBC: 462 ug/dL

## 2018-10-28 LAB — FERRITIN: Ferritin: 8 ng/mL — ABNORMAL LOW (ref 11–307)

## 2018-10-29 ENCOUNTER — Inpatient Hospital Stay: Payer: Medicaid Other

## 2018-10-29 ENCOUNTER — Encounter: Payer: Self-pay | Admitting: Oncology

## 2018-10-29 ENCOUNTER — Inpatient Hospital Stay (HOSPITAL_BASED_OUTPATIENT_CLINIC_OR_DEPARTMENT_OTHER): Payer: Medicaid Other | Admitting: Oncology

## 2018-10-29 DIAGNOSIS — D509 Iron deficiency anemia, unspecified: Secondary | ICD-10-CM | POA: Diagnosis not present

## 2018-10-29 DIAGNOSIS — O99012 Anemia complicating pregnancy, second trimester: Secondary | ICD-10-CM | POA: Diagnosis not present

## 2018-10-29 DIAGNOSIS — R5383 Other fatigue: Secondary | ICD-10-CM | POA: Diagnosis not present

## 2018-10-29 NOTE — Progress Notes (Signed)
HEMATOLOGY-ONCOLOGY TeleHEALTH VISIT PROGRESS NOTE  I connected with Molly Graves on 10/29/18 at 10:00 AM EDT by video enabled telemedicine visit and verified that I am speaking with the correct person using two identifiers.  I attempted to connect the patient for visual enabled telehealth visit via Doximity.   Patient was able to see me and hear me.  I was able to see patient however cannot hear patient.  Due to the technical difficulties with video,  Patient was transitioned to audio only visit. I discussed the limitations, risks, security and privacy concerns of performing an evaluation and management service by telemedicine and the availability of in-person appointments. I also discussed with the patient that there may be a patient responsible charge related to this service. The patient expressed understanding and agreed to proceed.   Other persons participating in the visit and their role in the encounter:  Merleen NicelyElizabeth Santos, RN, check in patient.   Patient's location: Home  Provider's location: Home office Chief Complaint: Follow-up for management of iron deficiency anemia during pregnancy.   INTERVAL HISTORY Molly Graves is a 28 y.o. female who has above history reviewed by me today presents for follow up visit for management of iron deficiency anemia during pregnancy Problems and complaints are listed below:  Patient was last seen by me on 09/03/2018 for anemia in pregnancy, she was found to have severe iron deficiency and she received IV Venofer 200mg  x 2.  Originally planned for Venofer treatments.  Patient was admitted from 10/10/2018-10/12/2018 due to suicidal thoughts. She missed other 2 Venofer treatments. She also has a prescription by GYN Dr. Jerene PitchSchuman for Vitafol Fe+, patient reports never picking up the prescription. Fatigue: reports worsening fatigue. Chronic onset, perisistent, no aggravating or improving factors, no associated symptoms.   Review of Systems  Constitutional:  Positive for fatigue. Negative for appetite change, chills and fever.  HENT:   Negative for hearing loss and voice change.   Eyes: Negative for eye problems.  Respiratory: Negative for chest tightness and cough.   Cardiovascular: Negative for chest pain.  Gastrointestinal: Negative for abdominal distention, abdominal pain and blood in stool.  Endocrine: Negative for hot flashes.  Genitourinary: Negative for difficulty urinating and frequency.   Musculoskeletal: Negative for arthralgias.  Skin: Negative for itching and rash.  Neurological: Negative for extremity weakness.  Hematological: Negative for adenopathy.  Psychiatric/Behavioral: Negative for confusion.    Past Medical History:  Diagnosis Date  . Anemia   . Depression    post partum depression  . Headache    MIGRAINES  . Herpes genitalia    Past Surgical History:  Procedure Laterality Date  . BREAST SURGERY     lumpectomy left breast  . DIAGNOSTIC LAPAROSCOPY  2015   to rule out uterine injury after D&E  . HYSTEROSCOPY W/D&C N/A 04/19/2016   Procedure: DILATATION AND CURETTAGE /HYSTEROSCOPY;  Surgeon: Nadara Mustardobert P Harris, MD;  Location: [facility] ORS;  Service: Gynecology;  Laterality: N/A;  . INDUCED ABORTION  2015  . IUD REMOVAL N/A 04/19/2016   Procedure: INTRAUTERINE DEVICE (IUD) REMOVAL;  Surgeon: Nadara Mustardobert P Harris, MD;  Location: [facility] ORS;  Service: Gynecology;  Laterality: N/A;  . LAPAROSCOPY N/A 04/19/2016   Procedure: LAPAROSCOPY DIAGNOSTIC;  Surgeon: Nadara Mustardobert P Harris, MD;  Location: [facility] ORS;  Service: Gynecology;  Laterality: N/A;    Family History  Problem Relation Age of Onset  . Hypertension Maternal Aunt   . Hypertension Maternal Grandmother   . Cancer Other 29  Cervical, Malignant  . Depression Mother   . Hypertension Mother   . Cancer Maternal Uncle   . Cancer Paternal Aunt     Social History   Socioeconomic History  . Marital status: Single    Spouse name: Not on file  . Number of children: Not on  file  . Years of education: Not on file  . Highest education level: Not on file  Occupational History  . Not on file  Social Needs  . Financial resource strain: Not on file  . Food insecurity:    Worry: Not on file    Inability: Not on file  . Transportation needs:    Medical: Not on file    Non-medical: Not on file  Tobacco Use  . Smoking status: Former Smoker    Years: 7.00    Types: Cigars    Last attempt to quit: 04/17/2018    Years since quitting: 0.5  . Smokeless tobacco: Never Used  Substance and Sexual Activity  . Alcohol use: Not Currently  . Drug use: No  . Sexual activity: Yes    Partners: Male    Birth control/protection: None  Lifestyle  . Physical activity:    Days per week: 6 days    Minutes per session: Not on file  . Stress: To some extent  Relationships  . Social connections:    Talks on phone: More than three times a week    Gets together: More than three times a week    Attends religious service: Never    Active member of club or organization: Yes    Attends meetings of clubs or organizations: Never    Relationship status: Living with partner  . Intimate partner violence:    Fear of current or ex partner: No    Emotionally abused: No    Physically abused: No    Forced sexual activity: No  Other Topics Concern  . Not on file  Social History Narrative  . Not on file    Current Outpatient Medications on File Prior to Visit  Medication Sig Dispense Refill  . Doxylamine-Pyridoxine (DICLEGIS) 10-10 MG TBEC Take 2 tablets by mouth at bedtime. If symptoms persist, add one tablet in the morning and one in the afternoon 100 tablet 5  . FLUoxetine (PROZAC) 20 MG capsule Take 1 capsule (20 mg total) by mouth daily. 30 capsule 1  . nitrofurantoin, macrocrystal-monohydrate, (MACROBID) 100 MG capsule Take 1 capsule (100 mg total) by mouth 2 (two) times daily. (Patient taking differently: Take 100 mg by mouth every evening. ) 14 capsule 0  . Prenat-Fe  Poly-Methfol-FA-DHA (VITAFOL FE+) 90-0.6-0.4-200 MG CAPS Take 1 tablet by mouth daily. 30 capsule 11   No current facility-administered medications on file prior to visit.     No Known Allergies     Observations/Objective: There were no vitals filed for this visit. There is no height or weight on file to calculate BMI.  Pain 0 Physical Exam  Constitutional: No distress.    CBC    Component Value Date/Time   WBC 9.1 10/28/2018 1352   RBC 4.09 10/28/2018 1352   HGB 11.2 (L) 10/28/2018 1352   HGB 12.8 06/08/2018 1605   HCT 35.0 (L) 10/28/2018 1352   HCT 37.4 06/08/2018 1605   PLT 219 10/28/2018 1352   PLT 309 06/08/2018 1605   MCV 85.6 10/28/2018 1352   MCV 83 06/08/2018 1605   MCV 86 06/20/2013 1711   MCH 27.4 10/28/2018 1352   MCHC  32.0 10/28/2018 1352   RDW 14.4 10/28/2018 1352   RDW 13.8 06/08/2018 1605   RDW 13.8 06/20/2013 1711   LYMPHSABS 1.6 10/28/2018 1352   LYMPHSABS 2.0 06/20/2013 1711   MONOABS 0.9 10/28/2018 1352   MONOABS 0.3 06/20/2013 1711   EOSABS 0.2 10/28/2018 1352   EOSABS 0.5 06/20/2013 1711   BASOSABS 0.0 10/28/2018 1352   BASOSABS 0.0 06/20/2013 1711    CMP     Component Value Date/Time   NA 135 10/10/2018 0030   NA 137 06/20/2013 1711   K 3.3 (L) 10/10/2018 0030   K 4.2 06/20/2013 1711   CL 105 10/10/2018 0030   CL 107 06/20/2013 1711   CO2 21 (L) 10/10/2018 0030   CO2 27 06/20/2013 1711   GLUCOSE 113 (H) 10/10/2018 0030   GLUCOSE 98 06/20/2013 1711   BUN 8 10/10/2018 0030   BUN 8 06/20/2013 1711   CREATININE 0.57 10/10/2018 0030   CREATININE 0.80 06/20/2013 1711   CALCIUM 9.0 10/10/2018 0030   CALCIUM 9.5 06/20/2013 1711   PROT 6.8 10/10/2018 0030   ALBUMIN 3.1 (L) 10/10/2018 0030   AST 16 10/10/2018 0030   ALT 15 10/10/2018 0030   ALKPHOS 87 10/10/2018 0030   BILITOT 0.5 10/10/2018 0030   GFRNONAA >60 10/10/2018 0030   GFRNONAA >60 06/20/2013 1711   GFRAA >60 10/10/2018 0030   GFRAA >60 06/20/2013 1711     Assessment and  Plan: 1. Iron deficiency anemia, unspecified iron deficiency anemia type   2. Anemia in pregnancy, second trimester   3. Other fatigue     Labs are reviewed and discussed with patient. Iron deficiency, ferritin 8, iron saturation 5 consistent with severe iron deficiency. Her hemoglobin has improved since last visit Patient is not compliant with oral iron supplementation. Recommend patient to proceed with IV Venofer every 2 weeks for 2 treatment.   Follow Up Instructions: Follow-up in 3 months lab MD Doximity visit  I discussed the assessment and treatment plan with the patient. The patient was provided an opportunity to ask questions and all were answered. The patient agreed with the plan and demonstrated an understanding of the instructions.  The patient was advised to call back or seek an in-person evaluation if the symptoms worsen or if the condition fails to improve as anticipated.    Rickard Patience, MD 10/29/2018 7:52 PM

## 2018-10-29 NOTE — Progress Notes (Signed)
Patient contacted for telehealth visit. Pt is [redacted] weeks pregnant, complains of mild nausea but she is taking antiemetics and it has helped.

## 2018-11-03 ENCOUNTER — Other Ambulatory Visit: Payer: Self-pay

## 2018-11-03 ENCOUNTER — Encounter: Payer: Medicaid Other | Admitting: Obstetrics and Gynecology

## 2018-11-11 ENCOUNTER — Other Ambulatory Visit: Payer: Self-pay

## 2018-11-12 ENCOUNTER — Inpatient Hospital Stay: Payer: Medicaid Other

## 2018-11-12 ENCOUNTER — Other Ambulatory Visit: Payer: Self-pay

## 2018-11-12 VITALS — BP 100/63 | HR 88 | Temp 97.1°F | Resp 18

## 2018-11-12 DIAGNOSIS — D509 Iron deficiency anemia, unspecified: Secondary | ICD-10-CM

## 2018-11-12 DIAGNOSIS — O99012 Anemia complicating pregnancy, second trimester: Secondary | ICD-10-CM | POA: Diagnosis not present

## 2018-11-12 MED ORDER — SODIUM CHLORIDE 0.9 % IV SOLN
Freq: Once | INTRAVENOUS | Status: AC
  Administered 2018-11-12: 14:00:00 via INTRAVENOUS
  Filled 2018-11-12: qty 250

## 2018-11-12 MED ORDER — IRON SUCROSE 20 MG/ML IV SOLN
200.0000 mg | Freq: Once | INTRAVENOUS | Status: AC
  Administered 2018-11-12: 200 mg via INTRAVENOUS
  Filled 2018-11-12: qty 10

## 2018-11-13 ENCOUNTER — Encounter: Payer: Medicaid Other | Admitting: Obstetrics and Gynecology

## 2018-11-13 ENCOUNTER — Telehealth: Payer: Self-pay | Admitting: Obstetrics and Gynecology

## 2018-11-13 NOTE — Telephone Encounter (Signed)
Tired calling pt for appointment but recording said phone was not accepting phone calls. Called twice.

## 2018-11-17 ENCOUNTER — Other Ambulatory Visit: Payer: Self-pay

## 2018-11-17 ENCOUNTER — Encounter: Payer: Self-pay | Admitting: Obstetrics and Gynecology

## 2018-11-17 ENCOUNTER — Ambulatory Visit (INDEPENDENT_AMBULATORY_CARE_PROVIDER_SITE_OTHER): Payer: Medicaid Other | Admitting: Obstetrics and Gynecology

## 2018-11-17 VITALS — Wt 189.0 lb

## 2018-11-17 DIAGNOSIS — M549 Dorsalgia, unspecified: Secondary | ICD-10-CM

## 2018-11-17 DIAGNOSIS — O2302 Infections of kidney in pregnancy, second trimester: Secondary | ICD-10-CM

## 2018-11-17 DIAGNOSIS — O99891 Other specified diseases and conditions complicating pregnancy: Secondary | ICD-10-CM

## 2018-11-17 DIAGNOSIS — Z3A32 32 weeks gestation of pregnancy: Secondary | ICD-10-CM

## 2018-11-17 DIAGNOSIS — O9989 Other specified diseases and conditions complicating pregnancy, childbirth and the puerperium: Secondary | ICD-10-CM

## 2018-11-17 DIAGNOSIS — Z3480 Encounter for supervision of other normal pregnancy, unspecified trimester: Secondary | ICD-10-CM

## 2018-11-17 DIAGNOSIS — O2303 Infections of kidney in pregnancy, third trimester: Secondary | ICD-10-CM

## 2018-11-17 DIAGNOSIS — O98513 Other viral diseases complicating pregnancy, third trimester: Secondary | ICD-10-CM | POA: Diagnosis not present

## 2018-11-17 DIAGNOSIS — B009 Herpesviral infection, unspecified: Secondary | ICD-10-CM

## 2018-11-17 HISTORY — DX: Other specified diseases and conditions complicating pregnancy: O99.891

## 2018-11-17 NOTE — Progress Notes (Signed)
ROB/Telephone visit  C/o severe back pain tylenol not working, using pain medicine from last hospital stay Back hurts when you touch it and when your not touching it.

## 2018-11-17 NOTE — Patient Instructions (Signed)
Sacroiliac Joint Dysfunction  Sacroiliac joint dysfunction is a condition that causes inflammation on one or both sides of the sacroiliac (SI) joint. The SI joint connects the lower part of the spine (sacrum) with the two upper portions of the pelvis (ilium). This condition causes deep aching or burning pain in the low back. In some cases, the pain may also spread into one or both buttocks, hips, or thighs. What are the causes? This condition may be caused by:  Pregnancy. During pregnancy, extra stress is put on the SI joints because the pelvis widens.  Injury, such as: ? Injuries from car accidents. ? Sports-related injuries. ? Work-related injuries.  Having one leg that is shorter than the other.  Conditions that affect the joints, such as: ? Rheumatoid arthritis. ? Gout. ? Psoriatic arthritis. ? Joint infection (septic arthritis). Sometimes, the cause of SI joint dysfunction is not known. What are the signs or symptoms? Symptoms of this condition include:  Aching or burning pain in the lower back. The pain may also spread to other areas, such as: ? Buttocks. ? Groin. ? Thighs.  Muscle spasms in or around the painful areas.  Increased pain when standing, walking, running, stair climbing, bending, or lifting. How is this diagnosed? This condition is diagnosed with a physical exam and medical history. During the exam, the health care provider may move one or both of your legs to different positions to check for pain. Various tests may be done to confirm the diagnosis, including:  Imaging tests to look for other causes of pain. These may include: ? MRI. ? CT scan. ? Bone scan.  Diagnostic injection. A numbing medicine is injected into the SI joint using a needle. If your pain is temporarily improved or stopped after the injection, this can indicate that SI joint dysfunction is the problem. How is this treated? Treatment depends on the cause and severity of your condition.  Treatment options may include:  Ice or heat applied to the lower back area after an injury. This may help reduce pain and muscle spasms.  Medicines to relieve pain or inflammation or to relax the muscles.  Wearing a back brace (sacroiliac brace) to help support the joint while your back is healing.  Physical therapy to increase muscle strength around the joint and flexibility at the joint. This may also involve learning proper body positions and ways of moving to relieve stress on the joint.  Direct manipulation of the SI joint.  Injections of steroid medicine into the joint to reduce pain and swelling.  Radiofrequency ablation to burn away nerves that are carrying pain messages from the joint.  Use of a device that provides electrical stimulation to help reduce pain at the joint.  Surgery to put in screws and plates that limit or prevent joint motion. This is rare. Follow these instructions at home: Medicines  Take over-the-counter and prescription medicines only as told by your health care provider.  Do not drive or use heavy machinery while taking prescription pain medicine.  If you are taking prescription pain medicine, take actions to prevent or treat constipation. Your health care provider may recommend that you: ? Drink enough fluid to keep your urine pale yellow. ? Eat foods that are high in fiber, such as fresh fruits and vegetables, whole grains, and beans. ? Limit foods that are high in fat and processed sugars, such as fried or sweet foods. ? Take an over-the-counter or prescription medicine for constipation. If you have a brace:    Wear the brace as told by your health care provider. Remove it only as told by your health care provider.  Keep the brace clean.  If the brace is not waterproof: ? Do not let it get wet. ? Cover it with a watertight covering when you take a bath or a shower. Managing pain, stiffness, and swelling      Icing can help with pain and  swelling. Heat may help with muscle tension or spasms. Ask your health care provider if you should use ice or heat.  If directed, put ice on the affected area: ? If you have a removable brace, remove it as told by your health care provider. ? Put ice in a plastic bag. ? Place a towel between your skin and the bag. ? Leave the ice on for 20 minutes, 2-3 times a day.  If directed, apply heat to the affected area. Use the heat source that your health care provider recommends, such as a moist heat pack or a heating pad. ? Place a towel between your skin and the heat source. ? Leave the heat on for 20-30 minutes. ? Remove the heat if your skin turns bright red. This is especially important if you are unable to feel pain, heat, or cold. You may have a greater risk of getting burned. General instructions  Rest as needed. Ask your health care provider what activities are safe for you.  Return to your normal activities as told by your health care provider.  Exercise as directed by your health care provider or physical therapist.  Do not use any products that contain nicotine or tobacco, such as cigarettes and e-cigarettes. These can delay bone healing. If you need help quitting, ask your health care provider.  Keep all follow-up visits as told by your health care provider. This is important. Contact a health care provider if:  Your pain is not controlled with medicine.  You have a fever.  Your pain is getting worse. Get help right away if:  You have weakness, numbness, or tingling in your legs or feet.  You lose control of your bladder or bowel. Summary  Sacroiliac joint dysfunction is a condition that causes inflammation on one or both sides of the sacroiliac (SI) joint.  This condition causes deep aching or burning pain in the low back. In some cases, the pain may also spread into one or both buttocks, hips, or thighs.  Treatment depends on the cause and severity of your condition.  It may include medicines to reduce pain and swelling or to relax muscles. This information is not intended to replace advice given to you by your health care provider. Make sure you discuss any questions you have with your health care provider. Document Released: 08/30/2008 Document Revised: 07/14/2017 Document Reviewed: 07/14/2017 Elsevier Interactive Patient Education  2019 [employer]    Back Exercises If you have pain in your back, do these exercises 2-3 times each day or as told by your doctor. When the pain goes away, do the exercises once each day, but repeat the steps more times for each exercise (do more repetitions). If you do not have pain in your back, do these exercises once each day or as told by your doctor. Exercises Single Knee to Chest Do these steps 3-5 times in a row for each leg: 1. Lie on your back on a firm bed or the floor with your legs stretched out. 2. Bring one knee to your chest. 3. Hold your knee  to your chest by grabbing your knee or thigh. 4. Pull on your knee until you feel a gentle stretch in your lower back. 5. Keep doing the stretch for 10-30 seconds. 6. Slowly let go of your leg and straighten it. Pelvic Tilt Do these steps 5-10 times in a row: 1. Lie on your back on a firm bed or the floor with your legs stretched out. 2. Bend your knees so they point up to the ceiling. Your feet should be flat on the floor. 3. Tighten your lower belly (abdomen) muscles to press your lower back against the floor. This will make your tailbone point up to the ceiling instead of pointing down to your feet or the floor. 4. Stay in this position for 5-10 seconds while you gently tighten your muscles and breathe evenly. Cat-Cow Do these steps until your lower back bends more easily: 1. Get on your hands and knees on a firm surface. Keep your hands under your shoulders, and keep your knees under your hips. You may put padding under your knees. 2. Let your head hang down,  and make your tailbone point down to the floor so your lower back is round like the back of a cat. 3. Stay in this position for 5 seconds. 4. Slowly lift your head and make your tailbone point up to the ceiling so your back hangs low (sags) like the back of a cow. 5. Stay in this position for 5 seconds.  Press-Ups Do these steps 5-10 times in a row: 1. Lie on your belly (face-down) on the floor. 2. Place your hands near your head, about shoulder-width apart. 3. While you keep your back relaxed and keep your hips on the floor, slowly straighten your arms to raise the top half of your body and lift your shoulders. Do not use your back muscles. To make yourself more comfortable, you may change where you place your hands. 4. Stay in this position for 5 seconds. 5. Slowly return to lying flat on the floor.  Bridges Do these steps 10 times in a row: 1. Lie on your back on a firm surface. 2. Bend your knees so they point up to the ceiling. Your feet should be flat on the floor. 3. Tighten your butt muscles and lift your butt off of the floor until your waist is almost as high as your knees. If you do not feel the muscles working in your butt and the back of your thighs, slide your feet 1-2 inches farther away from your butt. 4. Stay in this position for 3-5 seconds. 5. Slowly lower your butt to the floor, and let your butt muscles relax. If this exercise is too easy, try doing it with your arms crossed over your chest. Belly Crunches Do these steps 5-10 times in a row: 1. Lie on your back on a firm bed or the floor with your legs stretched out. 2. Bend your knees so they point up to the ceiling. Your feet should be flat on the floor. 3. Cross your arms over your chest. 4. Tip your chin a little bit toward your chest but do not bend your neck. 5. Tighten your belly muscles and slowly raise your chest just enough to lift your shoulder blades a tiny bit off of the floor. 6. Slowly lower your chest  and your head to the floor. Back Lifts Do these steps 5-10 times in a row: 1. Lie on your belly (face-down) with your arms at your sides,  and rest your forehead on the floor. 2. Tighten the muscles in your legs and your butt. 3. Slowly lift your chest off of the floor while you keep your hips on the floor. Keep the back of your head in line with the curve in your back. Look at the floor while you do this. 4. Stay in this position for 3-5 seconds. 5. Slowly lower your chest and your face to the floor. Contact a doctor if:  Your back pain gets a lot worse when you do an exercise.  Your back pain does not lessen 2 hours after you exercise. If you have any of these problems, stop doing the exercises. Do not do them again unless your doctor says it is okay. Get help right away if:  You have sudden, very bad back pain. If this happens, stop doing the exercises. Do not do them again unless your doctor says it is okay. This information is not intended to replace advice given to you by your health care provider. Make sure you discuss any questions you have with your health care provider. Document Released: 07/06/2010 Document Revised: 02/25/2018 Document Reviewed: 07/28/2014 Elsevier Interactive Patient Education  2019 [employer].   Administrator, arts refers to the movements and positions of your body while you do your daily activities. Posture is part of body mechanics. Good posture and healthy body mechanics can help to relieve stress in your body's tissues and joints. Good posture means that your spine is in its natural S-curve position (your spine is neutral), your shoulders are pulled back slightly, and your head is not tipped forward. The following are general guidelines for applying improved posture and body mechanics to your everyday activities. Standing   When standing, keep your spine neutral and your feet about hip-width apart. Keep a slight bend in your  knees. Your ears, shoulders, and hips should line up.  When you do a task in which you stand in one place for a long time, place one foot up on a stable object that is 2-4 inches (5-10 cm) high, such as a footstool. This helps keep your spine neutral. Sitting   When sitting, keep your spine neutral and keep your feet flat on the floor. Use a footrest, if necessary, and keep your thighs parallel to the floor. Avoid rounding your shoulders, and avoid tilting your head forward.  When working at a desk or a computer, keep your desk at a height where your hands are slightly lower than your elbows. Slide your chair under your desk so you are close enough to maintain good posture.  When working at a computer, place your monitor at a height where you are looking straight ahead and you do not have to tilt your head forward or downward to look at the screen. Resting   When lying down and resting, avoid positions that are most painful for you.  If you have pain with activities such as sitting, bending, stooping, or squatting (flexion-based activities), lie in a position in which your body does not bend very much. For example, avoid curling up on your side with your arms and knees near your chest (fetal position).  If you have pain with activities such as standing for a long time or reaching with your arms (extension-based activities), lie with your spine in a neutral position and bend your knees slightly. Try the following positions: ? Lying on your side with a pillow between your knees. ? Lying on your  back with a pillow under your knees. Lifting   When lifting objects, keep your feet at least shoulder-width apart and tighten your abdominal muscles.  Bend your knees and hips and keep your spine neutral. It is important to lift using the strength of your legs, not your back. Do not lock your knees straight out.  Always ask for help to lift heavy or awkward objects. This information is not intended  to replace advice given to you by your health care provider. Make sure you discuss any questions you have with your health care provider. Document Released: 06/03/2005 Document Revised: 02/08/2016 Document Reviewed: 02/17/2015 Elsevier Interactive Patient Education  2019 [employer].

## 2018-11-17 NOTE — Progress Notes (Signed)
Routine Prenatal Care Visit- Virtual Visit  Subjective   Virtual Visit via Telephone Note  I connected with Molly Graves on 11/17/18 at 11:10 AM EDT by telephone and verified that I am speaking with the correct person using two identifiers.   I discussed the limitations, risks, security and privacy concerns of performing an evaluation and management service by telephone and the availability of in person appointments. I also discussed with the patient that there may be a patient responsible charge related to this service. The patient expressed understanding and agreed to proceed.  The patient was at home I spoke with the patient from my  office The names of people involved in this encounter were: Chana Bode and Dr. Jerene Pitch.    Molly Graves is a 28 y.o. [phone] at [redacted]w[redacted]d being seen today for ongoing prenatal care.  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  ----------------------------------------------------------------------------------- Patient reports backache.    .  .   . Denies leaking of fluid.  ----------------------------------------------------------------------------------- The following portions of the patient's history were reviewed and updated as appropriate: allergies, current medications, past family history, past medical history, past social history, past surgical history and problem list. Problem list updated.  Objective  Weight 189 lb (85.7 kg), last menstrual period 04/06/2018. Pregravid weight 164 lb (74.4 kg) Total Weight Gain 25 lb (11.3 kg) Urinalysis:      Fetal  Status:           Physical Exam could not be performed. Because of the COVID-19 outbreak this visit was performed over the phone and not in person.   Assessment   28 y.o. 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 10/02/2018 by Conard Novak, MD No   Supervision of other normal pregnancy, antepartum 05/22/2018 by Oswaldo Conroy, CNM No   Overview Addendum 08/26/2018  4:59 PM by Natale Milch, MD    Clinic [facility] Prenatal Labs  Dating [place] Blood type: O/Positive/-- (12/23 1605)   Genetic Screen declines Antibody:Negative (12/23 1605)  Anatomic [place]  incomplete Rubella: 1.86 (12/23 1605) Varicella:    GTT Third trimester:  RPR: Non Reactive (12/23 1605)   Rhogam O+ HBsAg: Negative (12/23 1605)   TDaP vaccine              Flu Shot: HIV: Non Reactive (12/23 1605)   Baby Food                                GBS: pending  Contraception Declines BTL, IUD.  Undecided INO:MVEHM PP  CBB     CS/VBAC na   Support Person                  Gestational age appropriate obstetric precautions including but not limited to vaginal bleeding, contractions, leaking of fluid and fetal movement were reviewed in detail with the patient.    Back pain Irregular contractions continue Lost mucus plug 1 week ago  Encouraged her to come in if her contractions are regular or worsening in terms of pain. She feels like they are not frequent or too painful at this time  No fevers, no pain with urination, reports that back pain is different then when she had pyelonephritis. Much lower. She has been still taking her daily macrobid.  Discussed return to work. She works as a Conservation officer, naturecashier and is concerned with standing for a long period of time. Discussed that she does not currently have a disability and that I can not currently  write her off of work. She thinks she will stop working [employer].    Follow Up Instructions: Office visit in person in 2 weeks   I discussed the assessment and treatment plan with the patient. The patient was provided an opportunity to ask questions and all were answered. The patient agreed with the plan and demonstrated an understanding of the instructions.   The patient was advised to call back or seek an in-person evaluation if the symptoms worsen or if the condition fails to improve as anticipated.  I provided 15 minutes of non-face-to-face time during this encounter.  No follow-ups on file.  Adelene Idlerhristanna Alfred Harrel MD [facility], [facility] 11/17/2018 12:45 PM

## 2018-11-25 ENCOUNTER — Other Ambulatory Visit: Payer: Self-pay

## 2018-11-26 ENCOUNTER — Other Ambulatory Visit: Payer: Self-pay

## 2018-11-26 ENCOUNTER — Inpatient Hospital Stay: Payer: Medicaid Other | Attending: Oncology

## 2018-11-26 VITALS — BP 100/63 | HR 92 | Temp 97.9°F | Resp 20

## 2018-11-26 DIAGNOSIS — O99013 Anemia complicating pregnancy, third trimester: Secondary | ICD-10-CM | POA: Insufficient documentation

## 2018-11-26 DIAGNOSIS — D509 Iron deficiency anemia, unspecified: Secondary | ICD-10-CM | POA: Diagnosis present
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
## 2018-11-26 MED ORDER — SODIUM CHLORIDE 0.9 % IV SOLN
Freq: Once | INTRAVENOUS | Status: AC
  Administered 2018-11-26: 14:00:00 via INTRAVENOUS
  Filled 2018-11-26: qty 250

## 2018-11-26 MED ORDER — IRON SUCROSE 20 MG/ML IV SOLN
200.0000 mg | Freq: Once | INTRAVENOUS | Status: AC
  Administered 2018-11-26: 14:00:00 200 mg via INTRAVENOUS
  Filled 2018-11-26: qty 10

## 2018-11-30 ENCOUNTER — Encounter: Payer: Medicaid Other | Admitting: Obstetrics and Gynecology

## 2018-12-02 ENCOUNTER — Encounter: Payer: Medicaid Other | Admitting: Obstetrics & Gynecology

## 2018-12-08 ENCOUNTER — Ambulatory Visit (INDEPENDENT_AMBULATORY_CARE_PROVIDER_SITE_OTHER): Payer: Medicaid Other | Admitting: Obstetrics and Gynecology

## 2018-12-08 ENCOUNTER — Other Ambulatory Visit: Payer: Self-pay

## 2018-12-08 ENCOUNTER — Encounter: Payer: Self-pay | Admitting: Obstetrics and Gynecology

## 2018-12-08 VITALS — BP 104/68 | Wt 190.0 lb

## 2018-12-08 DIAGNOSIS — Z3A35 35 weeks gestation of pregnancy: Secondary | ICD-10-CM

## 2018-12-08 DIAGNOSIS — O2302 Infections of kidney in pregnancy, second trimester: Secondary | ICD-10-CM

## 2018-12-08 DIAGNOSIS — Z23 Encounter for immunization: Secondary | ICD-10-CM

## 2018-12-08 DIAGNOSIS — Z3480 Encounter for supervision of other normal pregnancy, unspecified trimester: Secondary | ICD-10-CM

## 2018-12-08 DIAGNOSIS — D508 Other iron deficiency anemias: Secondary | ICD-10-CM

## 2018-12-08 NOTE — Progress Notes (Signed)
ROB C/o braxton hicks, some clear discharge some days, and back pain No vb, Good FM

## 2018-12-08 NOTE — Progress Notes (Signed)
    Routine Prenatal Care Visit  Subjective  Molly Graves is a 28 y.o. [phone] at [redacted]w[redacted]d being seen today for ongoing prenatal care.  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  ----------------------------------------------------------------------------------- Patient reports pelvic pain and pressure, tenderness of pubic symphisis and irregular contractions every 15-20 minutes.   Contractions: Irregular. Vag. Bleeding: None.  Movement: Present. Denies leaking of fluid.  ----------------------------------------------------------------------------------- The following portions of the patient's history were reviewed and updated as appropriate: allergies, current medications, past family history, past medical history, past social history, past surgical history and problem list. Problem list updated.   Objective  Blood pressure 104/68, weight 190 lb (86.2 kg), last menstrual period 04/06/2018. Pregravid weight 164 lb (74.4 kg) Total Weight Gain 26 lb (11.8 kg) Urinalysis:      Fetal Status: Fetal Heart Rate (bpm): 140   Movement: Present     General:  Alert, oriented and cooperative. Patient is in no acute distress.  Skin: Skin is warm and dry. No rash noted.   Cardiovascular: Normal heart rate noted  Respiratory: Normal respiratory effort, no problems with respiration noted  Abdomen: Soft, gravid, appropriate for gestational age. Pain/Pressure: Present     Pelvic:  Cervical exam deferred        Extremities: Normal range of motion.  Edema: None   Mental Status: Normal mood and affect. Normal behavior. Normal judgment and thought content.     Assessment   28 y.o. 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 10/02/2018 by [name], MD No   Supervision of other normal pregnancy, antepartum 05/22/2018 by [name], CNM No   Overview Addendum 08/26/2018  4:59 PM by Homero Fellers, MD    [facility]inic [facility] Prenatal Labs  Dating [place] Blood type: O/Positive/-- (12/23 1605)   Genetic Screen declines Antibody:Negative (12/23 1605)  Anatomic [place]  incomplete Rubella: 1.86 (12/23 1605) Varicella:    GTT Third trimester:  RPR: Non Reactive (12/23 1605)   Rhogam O+ HBsAg: Negative (12/23 1605)   TDaP vaccine              Flu Shot: HIV: Non Reactive (12/23 1605)   Baby Food                                GBS: pending  Contraception Declines BTL, IUD.  Undecided [id number] PP  CBB     CS/VBAC na   Support Person                  Gestational age appropriate obstetric precautions including but not limited to vaginal bleeding, contractions, leaking of fluid and fetal movement were reviewed in detail with the patient.    TDAP today  Return in about 1 week (around 12/15/2018) for ROB in person.  Homero Fellers MD [facility], [facility] 12/08/2018, 4:51 PM

## 2018-12-13 ENCOUNTER — Encounter: Payer: Self-pay | Admitting: *Deleted

## 2018-12-13 ENCOUNTER — Other Ambulatory Visit: Payer: Self-pay

## 2018-12-13 ENCOUNTER — Observation Stay
Admission: EM | Admit: 2018-12-13 | Discharge: 2018-12-13 | Disposition: A | Discharge status: Home / Self Care | Payer: Medicaid Other | Attending: Obstetrics and Gynecology | Admitting: Obstetrics and Gynecology

## 2018-12-13 DIAGNOSIS — Z3A35 35 weeks gestation of pregnancy: Secondary | ICD-10-CM

## 2018-12-13 DIAGNOSIS — B009 Herpesviral infection, unspecified: Secondary | ICD-10-CM | POA: Diagnosis not present

## 2018-12-13 DIAGNOSIS — R300 Dysuria: Secondary | ICD-10-CM | POA: Diagnosis present

## 2018-12-13 DIAGNOSIS — O26893 Other specified pregnancy related conditions, third trimester: Secondary | ICD-10-CM

## 2018-12-13 DIAGNOSIS — Z3480 Encounter for supervision of other normal pregnancy, unspecified trimester: Secondary | ICD-10-CM

## 2018-12-13 DIAGNOSIS — O26892 Other specified pregnancy related conditions, second trimester: Secondary | ICD-10-CM

## 2018-12-13 DIAGNOSIS — O26853 Spotting complicating pregnancy, third trimester: Secondary | ICD-10-CM | POA: Diagnosis not present

## 2018-12-13 DIAGNOSIS — O2302 Infections of kidney in pregnancy, second trimester: Secondary | ICD-10-CM

## 2018-12-13 DIAGNOSIS — O98513 Other viral diseases complicating pregnancy, third trimester: Secondary | ICD-10-CM | POA: Insufficient documentation

## 2018-12-13 HISTORY — DX: Dysuria: R30.0

## 2018-12-13 LAB — URINALYSIS, ROUTINE W REFLEX MICROSCOPIC
Bacteria, UA: NONE SEEN
Bilirubin Urine: NEGATIVE
Glucose, UA: NEGATIVE mg/dL
Ketones, ur: NEGATIVE mg/dL
Leukocytes,Ua: NEGATIVE
Nitrite: NEGATIVE
Protein, ur: NEGATIVE mg/dL
Specific Gravity, Urine: 1.01 (ref 1.005–1.030)
pH: 7 (ref 5.0–8.0)

## 2018-12-13 LAB — CHLAMYDIA/NGC RT PCR (ARMC ONLY)
Chlamydia Tr: NOT DETECTED
N gonorrhoeae: NOT DETECTED

## 2018-12-13 MED ORDER — NITROFURANTOIN MONOHYD MACRO 100 MG PO CAPS: 100.0000 mg | ORAL_CAPSULE | Freq: Two times a day (BID) | ORAL | 0 refills | Status: DC

## 2018-12-13 MED ORDER — ACETAMINOPHEN 325 MG PO TABS: 650.0000 mg | ORAL_TABLET | ORAL | Status: DC | PRN

## 2018-12-13 NOTE — OB Triage Note (Signed)
Pt co spotting this am, bright red, not saturating a pad. Last intercourse was Saturday. Reports + fetal movement. Denies LOF. Molly Graves

## 2018-12-13 NOTE — Discharge Summary (Signed)
Physician Final Progress Note  Patient ID: Molly Graves MRN: [id number] DOB/AGE: 1991-04-18 28 y.o.  Admit date: 12/13/2018 Admitting provider: Malachy Mood, MD Discharge date: 12/13/2018   Admission Diagnoses: Dysuria, postcoital spotting  Discharge Diagnoses:  Active Problems:   Dysuria  28 y.o. G3T5176 at [redacted]w[redacted]d presenting with dysuria and frequency.  History of prior admission for pyelonephritis this pregnancy.  She also reports some light vaginal spotting.  Intercourse within the past 24-hrs.  +FM, no LOF, some mild contractions. UA obtained and negative other than small leuks.  Urine culture sent.  Given history 7 day course of Macrobid, has not been taking Macrobid suppresion.  Cervix closed, wet mount negative.  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 10/02/2018 by [name], MD No   Supervision of other normal pregnancy, antepartum 05/22/2018 by [name], CNM No   Overview Addendum 08/26/2018  4:59 PM by Homero Fellers, MD    Clinic [facility] Prenatal Labs  Dating [place] Blood type: O/Positive/-- (12/23 1605)   Genetic Screen declines Antibody:Negative (12/23 1605)  Anatomic [place]  incomplete Rubella: 1.86 (12/23 1605) Varicella:    GTT Third trimester:  RPR: Non Reactive (12/23 1605)   Rhogam O+ HBsAg: Negative (12/23 1605)   TDaP vaccine              Flu Shot: HIV: Non Reactive (12/23 1605)   Baby Food                                GBS: pending  Contraception Declines BTL, IUD.  Undecided HYW:VPXTG PP  CBB     CS/VBAC na   Support Person                Temp:  [98.6 F (37 C)] 98.6 F (37 C) (06/28 1608) Pulse Rate:  [81] 81 (06/28 1608) Resp:  [16] 16 (06/28 1608) BP: (128)/(68) 128/68 (06/28 1608) Weight:  [89.8 kg] 89.8 kg (06/28 1618)   Consults: None  Significant Findings/ Diagnostic Studies: Results for orders placed or performed  during the hospital encounter of 12/13/18 (from the past 24 hour(s))  Urinalysis, Routine w reflex microscopic     Status: Abnormal   Collection Time: 12/13/18  4:51 PM  Result Value Ref Range   Color, Urine YELLOW (A) YELLOW   APPearance CLEAR (A) CLEAR   Specific Gravity, Urine 1.010 1.005 - 1.030   pH 7.0 5.0 - 8.0   Glucose, UA NEGATIVE NEGATIVE mg/dL   Hgb urine dipstick SMALL (A) NEGATIVE   Bilirubin Urine NEGATIVE NEGATIVE   Ketones, ur NEGATIVE NEGATIVE mg/dL   Protein, ur NEGATIVE NEGATIVE mg/dL   Nitrite NEGATIVE NEGATIVE   Leukocytes,Ua NEGATIVE NEGATIVE   RBC / HPF 11-20 0 - 5 RBC/hpf   WBC, UA 11-20 0 - 5 WBC/hpf   Bacteria, UA NONE SEEN NONE SEEN   Squamous Epithelial / LPF 0-5 0 - 5   Mucus PRESENT    Procedures:  Baseline: 135 Variability: moderate Accelerations: present Decelerations: absent Tocometry: irregular, every 8-10 minutes The patient was monitored for 30 minutes, fetal heart rate tracing was deemed reactive, category I tracing,  CPT [id number]   Discharge Condition: good  Disposition: Discharge disposition: 01-Home or Self Care       Diet: Regular diet  Discharge Activity: Activity as tolerated  Discharge  Instructions    Discharge activity:  No Restrictions   Complete by: As directed    Discharge diet:  No restrictions   Complete by: As directed    Fetal Kick Count:  Lie on our left side for one hour after a meal, and count the number of times your baby kicks.  If it is less than 5 times, get up, move around and drink some juice.  Repeat the test 30 minutes later.  If it is still less than 5 kicks in an hour, notify your doctor.   Complete by: As directed    LABOR:  When conractions begin, you should start to time them from the beginning of one contraction to the beginning  of the next.  When contractions are 5 - 10 minutes apart or less and have been regular for at least an hour, you should call your health care provider.   Complete by: As  directed    No sexual activity restrictions   Complete by: As directed    Notify physician for bleeding from the vagina   Complete by: As directed    Notify physician for blurring of vision or spots before the eyes   Complete by: As directed    Notify physician for chills or fever   Complete by: As directed    Notify physician for fainting spells, "black outs" or loss of consciousness   Complete by: As directed    Notify physician for increase in vaginal discharge   Complete by: As directed    Notify physician for leaking of fluid   Complete by: As directed    Notify physician for pain or burning when urinating   Complete by: As directed    Notify physician for pelvic pressure (sudden increase)   Complete by: As directed    Notify physician for severe or continued nausea or vomiting   Complete by: As directed    Notify physician for sudden gushing of fluid from the vagina (with or without continued leaking)   Complete by: As directed    Notify physician for sudden, constant, or occasional abdominal pain   Complete by: As directed    Notify physician if baby moving less than usual   Complete by: As directed      Allergies as of 12/13/2018   No Known Allergies     Medication List    TAKE these medications   acetaminophen 500 MG tablet Commonly known as: TYLENOL Take 1,000 mg by mouth every 6 (six) hours as needed.   Doxylamine-Pyridoxine 10-10 MG Tbec Commonly known as: Diclegis Take 2 tablets by mouth at bedtime. If symptoms persist, add one tablet in the morning and one in the afternoon   FLUoxetine 20 MG capsule Commonly known as: PROZAC Take 1 capsule (20 mg total) by mouth daily.   nitrofurantoin (macrocrystal-monohydrate) 100 MG capsule Commonly known as: MACROBID Take 1 capsule (100 mg total) by mouth 2 (two) times daily. What changed: when to take this   Vitafol FE+ 90-0.6-0.4-200 MG Caps Take 1 tablet by mouth daily.        Total time spent taking  care of this patient: 30 minutes  Signed: Vena Austriandreas Monzerrath Mcburney 12/13/2018, 6:04 PM

## 2018-12-15 LAB — URINE CULTURE
Culture: NO GROWTH
Special Requests: NORMAL

## 2018-12-16 ENCOUNTER — Other Ambulatory Visit: Payer: Self-pay

## 2018-12-16 ENCOUNTER — Encounter: Payer: Self-pay | Admitting: Obstetrics and Gynecology

## 2018-12-16 ENCOUNTER — Ambulatory Visit (INDEPENDENT_AMBULATORY_CARE_PROVIDER_SITE_OTHER): Payer: Medicaid Other | Admitting: Obstetrics and Gynecology

## 2018-12-16 VITALS — BP 102/58 | Wt 191.0 lb

## 2018-12-16 DIAGNOSIS — O26843 Uterine size-date discrepancy, third trimester: Secondary | ICD-10-CM

## 2018-12-16 DIAGNOSIS — Z3A36 36 weeks gestation of pregnancy: Secondary | ICD-10-CM

## 2018-12-16 LAB — POCT URINALYSIS DIPSTICK OB: Glucose, UA: NEGATIVE

## 2018-12-16 NOTE — Progress Notes (Signed)
ROB GBS/Aptima  C/o contractions 7/10 on pain scale, hasnt been counting yet, pelvic pressure and pain  Having decreased movement, poss lof, vb on Sunday

## 2018-12-16 NOTE — Progress Notes (Signed)
    Routine Prenatal Care Visit  Subjective  Molly Graves is a 28 y.o. Y1P5093 at [redacted]w[redacted]d being seen today for ongoing prenatal care.  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  ----------------------------------------------------------------------------------- Patient reports backache.   Contractions: Irregular. Vag. Bleeding: None.  Movement: Present. Denies leaking of fluid.  ----------------------------------------------------------------------------------- The following portions of the patient's history were reviewed and updated as appropriate: allergies, current medications, past family history, past medical history, past social history, past surgical history and problem list. Problem list updated.   Objective  Blood pressure (!) 102/58, weight 191 lb (86.6 kg), last menstrual period 04/06/2018. Pregravid weight 164 lb (74.4 kg) Total Weight Gain 27 lb (12.2 kg) Urinalysis:      Fetal Status: Fetal Heart Rate (bpm): 140 Fundal Height: 40 cm Movement: Present  Presentation: Vertex  General:  Alert, oriented and cooperative. Patient is in no acute distress.  Skin: Skin is warm and dry. No rash noted.   Cardiovascular: Normal heart rate noted  Respiratory: Normal respiratory effort, no problems with respiration noted  Abdomen: Soft, gravid, appropriate for gestational age. Pain/Pressure: Present     Pelvic:  Cervical exam deferred        Extremities: Normal range of motion.  Edema: None  Mental Status: Normal mood and affect.  Normal behavior. Normal judgment and thought content.     Assessment   28 y.o. 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 10/02/2018 by [name], MD No   Supervision of other normal pregnancy, antepartum 05/22/2018 by [name], CNM No   Overview Addendum 08/26/2018  4:59 PM by Homero Fellers, MD    Clinic [facility] Prenatal Labs  Dating [place] Blood type: O/Positive/-- (12/23 1605)   Genetic Screen declines Antibody:Negative (12/23 1605)  Anatomic [place]  incomplete Rubella: 1.86 (12/23 1605) Varicella:    GTT Third trimester:  RPR: Non Reactive (12/23 1605)   Rhogam O+ HBsAg: Negative (12/23 1605)   TDaP vaccine              Flu Shot: HIV: Non Reactive (12/23 1605)   Baby Food                                GBS: pending  Contraception Declines BTL, IUD.  Undecided YKD:XIPJA PP  CBB     CS/VBAC na   Support Person                  Gestational age appropriate obstetric precautions including but not limited to vaginal bleeding, contractions, leaking of fluid and fetal movement were reviewed in detail with the patient.    GBS collected  Return in about 1 week (around 12/23/2018) for ROB and [place].  Homero Fellers MD [facility], [facility] 12/16/2018, 10:29 PM

## 2018-12-20 LAB — CULTURE, BETA STREP (GROUP B ONLY): Strep Gp B Culture: POSITIVE — AB

## 2018-12-21 ENCOUNTER — Other Ambulatory Visit: Payer: Self-pay | Admitting: Obstetrics and Gynecology

## 2018-12-21 DIAGNOSIS — O98513 Other viral diseases complicating pregnancy, third trimester: Secondary | ICD-10-CM

## 2018-12-21 DIAGNOSIS — B009 Herpesviral infection, unspecified: Secondary | ICD-10-CM

## 2018-12-21 MED ORDER — VALACYCLOVIR HCL 500 MG PO TABS: 500.0000 mg | ORAL_TABLET | Freq: Two times a day (BID) | ORAL | 6 refills | Status: DC

## 2018-12-21 NOTE — Progress Notes (Signed)
Called, but phone is out of service. Released to mychart with note.

## 2018-12-23 ENCOUNTER — Encounter: Payer: Medicaid Other | Admitting: Maternal Newborn

## 2018-12-23 ENCOUNTER — Other Ambulatory Visit: Payer: Medicaid Other

## 2018-12-24 ENCOUNTER — Ambulatory Visit (INDEPENDENT_AMBULATORY_CARE_PROVIDER_SITE_OTHER): Payer: Medicaid Other | Admitting: Obstetrics and Gynecology

## 2018-12-24 ENCOUNTER — Encounter: Payer: Self-pay | Admitting: Obstetrics and Gynecology

## 2018-12-24 ENCOUNTER — Other Ambulatory Visit: Payer: Self-pay

## 2018-12-24 ENCOUNTER — Ambulatory Visit (INDEPENDENT_AMBULATORY_CARE_PROVIDER_SITE_OTHER): Payer: Medicaid Other

## 2018-12-24 VITALS — BP 118/70 | Wt 192.0 lb

## 2018-12-24 DIAGNOSIS — O26843 Uterine size-date discrepancy, third trimester: Secondary | ICD-10-CM | POA: Diagnosis not present

## 2018-12-24 DIAGNOSIS — Z3480 Encounter for supervision of other normal pregnancy, unspecified trimester: Secondary | ICD-10-CM

## 2018-12-24 DIAGNOSIS — Z3A37 37 weeks gestation of pregnancy: Secondary | ICD-10-CM | POA: Diagnosis not present

## 2018-12-24 DIAGNOSIS — Z3483 Encounter for supervision of other normal pregnancy, third trimester: Secondary | ICD-10-CM

## 2018-12-24 DIAGNOSIS — Z3A36 36 weeks gestation of pregnancy: Secondary | ICD-10-CM

## 2018-12-24 NOTE — Progress Notes (Signed)
Routine Prenatal Care Visit  Subjective  Molly Graves is a 28 y.o. [id number] at [redacted]w[redacted]d being seen today for ongoing prenatal care.  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  ----------------------------------------------------------------------------------- Patient reports pelvic pressure and frequent contractions.   Contractions: Regular. Vag. Bleeding: None.  Movement: Present. Denies leaking of fluid.  ----------------------------------------------------------------------------------- The following portions of the patient's history were reviewed and updated as appropriate: allergies, current medications, past family history, past medical history, past social history, past surgical history and problem list. Problem list updated.   Objective  Blood pressure 118/70, weight 192 lb (87.1 kg), last menstrual period 04/06/2018. Pregravid weight 164 lb (74.4 kg) Total Weight Gain 28 lb (12.7 kg) Urinalysis:      Fetal Status: Fetal Heart Rate (bpm): 158 Fundal Height: 40 cm Movement: Present  Presentation: Vertex  General:  Alert, oriented and cooperative. Patient is in no acute distress.  Skin: Skin is warm and dry. No rash noted.   Cardiovascular: Normal heart rate noted  Respiratory: Normal respiratory effort, no problems with respiration noted  Abdomen: Soft, gravid, appropriate for gestational age. Pain/Pressure: Present     Pelvic:  Cervical exam performed     Station: 0  Extremities: Normal range of motion.  Edema: None  Mental  Status: Normal mood and affect. Normal behavior. Normal judgment and thought content.     Assessment   28 y.o. 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 10/02/2018 by [name], MD No   Supervision of other normal pregnancy, antepartum 05/22/2018 by [name], CNM No   Overview Addendum 08/26/2018  4:59 PM by Homero Fellers, MD    Clinic [facility] Prenatal Labs  Dating [place] Blood type: O/Positive/-- (12/23 1605)   Genetic Screen declines Antibody:Negative (12/23 1605)  Anatomic [place]  incomplete Rubella: 1.86 (12/23 1605) Varicella:    GTT Third trimester:  RPR: Non Reactive (12/23 1605)   Rhogam O+ HBsAg: Negative (12/23 1605)   TDaP vaccine              Flu Shot: HIV: Non Reactive (12/23 1605)   Baby Food                                GBS: pending  Contraception Declines BTL, IUD.  Undecided ION:GEXBM PP  CBB     CS/VBAC na   Support Person                  Gestational age appropriate obstetric precautions including but not limited to vaginal bleeding, contractions, leaking of fluid and fetal movement were reviewed in detail with the patient.    Cervix very posterior, but fetal head low at 0 station. Discontinued exam because of patient discomfort and very posterior cervix.  Has not started, emphasized the importance of picking up this medication.   Return in about 1 week (around 12/31/2018) for  ROB in person.  Natale Milchhristanna R Imer Foxworth MD [facility], [facility] 12/24/2018, 11:02 AM

## 2018-12-24 NOTE — Progress Notes (Signed)
ROB/US C/o pelvic pressure/pain  Denies lof, no vb, Some FM

## 2018-12-25 ENCOUNTER — Other Ambulatory Visit: Payer: Self-pay

## 2018-12-25 ENCOUNTER — Inpatient Hospital Stay
Admission: EM | Admit: 2018-12-25 | Discharge: 2018-12-27 | DRG: 806 | Disposition: A | Discharge status: Home / Self Care | Payer: Medicaid Other | Attending: Obstetrics and Gynecology | Admitting: Obstetrics and Gynecology

## 2018-12-25 ENCOUNTER — Encounter: Payer: Self-pay | Admitting: Certified Nurse Midwife

## 2018-12-25 ENCOUNTER — Telehealth: Payer: Self-pay

## 2018-12-25 DIAGNOSIS — Z87891 Personal history of nicotine dependence: Secondary | ICD-10-CM | POA: Diagnosis not present

## 2018-12-25 DIAGNOSIS — Z9114 Patient's other noncompliance with medication regimen: Secondary | ICD-10-CM | POA: Diagnosis not present

## 2018-12-25 DIAGNOSIS — O99344 Other mental disorders complicating childbirth: Secondary | ICD-10-CM | POA: Diagnosis not present

## 2018-12-25 DIAGNOSIS — B009 Herpesviral infection, unspecified: Secondary | ICD-10-CM

## 2018-12-25 DIAGNOSIS — O9081 Anemia of the puerperium: Secondary | ICD-10-CM | POA: Diagnosis not present

## 2018-12-25 DIAGNOSIS — Z3A37 37 weeks gestation of pregnancy: Secondary | ICD-10-CM | POA: Diagnosis not present

## 2018-12-25 DIAGNOSIS — O26893 Other specified pregnancy related conditions, third trimester: Secondary | ICD-10-CM | POA: Diagnosis present

## 2018-12-25 DIAGNOSIS — Z3480 Encounter for supervision of other normal pregnancy, unspecified trimester: Secondary | ICD-10-CM

## 2018-12-25 DIAGNOSIS — O99824 Streptococcus B carrier state complicating childbirth: Secondary | ICD-10-CM | POA: Diagnosis present

## 2018-12-25 DIAGNOSIS — Z1159 Encounter for screening for other viral diseases: Secondary | ICD-10-CM

## 2018-12-25 DIAGNOSIS — O9902 Anemia complicating childbirth: Secondary | ICD-10-CM | POA: Diagnosis not present

## 2018-12-25 DIAGNOSIS — D62 Acute posthemorrhagic anemia: Secondary | ICD-10-CM | POA: Diagnosis not present

## 2018-12-25 LAB — CBC
HCT: 38 % (ref 36.0–46.0)
Hemoglobin: 12.5 g/dL (ref 12.0–15.0)
MCH: 27.8 pg (ref 26.0–34.0)
MCHC: 32.9 g/dL (ref 30.0–36.0)
MCV: 84.4 fL (ref 80.0–100.0)
Platelets: 248 10*3/uL (ref 150–400)
RBC: 4.5 MIL/uL (ref 3.87–5.11)
RDW: 15.6 % — ABNORMAL HIGH (ref 11.5–15.5)
WBC: 11.4 10*3/uL — ABNORMAL HIGH (ref 4.0–10.5)
nRBC: 0 % (ref 0.0–0.2)

## 2018-12-25 LAB — ANTIBODY SCREEN: Antibody Screen: NEGATIVE

## 2018-12-25 LAB — ABO/RH: ABO/RH(D): O POS

## 2018-12-25 LAB — SARS CORONAVIRUS 2 BY RT PCR (HOSPITAL ORDER, PERFORMED IN ~~LOC~~ HOSPITAL LAB): SARS Coronavirus 2: NEGATIVE

## 2018-12-25 MED ORDER — OXYCODONE HCL 5 MG PO TABS: 5.0000 mg | ORAL_TABLET | ORAL | Status: DC | PRN

## 2018-12-25 MED ORDER — ACETAMINOPHEN 325 MG PO TABS
650.0000 mg | ORAL_TABLET | ORAL | Status: DC | PRN
  Filled 2018-12-25: qty 2

## 2018-12-25 MED ORDER — SENNOSIDES-DOCUSATE SODIUM 8.6-50 MG PO TABS
2.0000 | ORAL_TABLET | ORAL | Status: DC
  Administered 2018-12-26 – 2018-12-27 (×2): 2 via ORAL
  Filled 2018-12-25 (×2): qty 2

## 2018-12-25 MED ORDER — OXYTOCIN BOLUS FROM INFUSION
500.0000 mL | Freq: Once | INTRAVENOUS | Status: DC
  Administered 2018-12-25: 500 mL via INTRAVENOUS

## 2018-12-25 MED ORDER — AMMONIA AROMATIC IN INHA: 0.3000 mL | Freq: Once | RESPIRATORY_TRACT | Status: DC | PRN

## 2018-12-25 MED ORDER — SODIUM CHLORIDE 0.9 % IV SOLN
5.0000 10*6.[IU] | Freq: Once | INTRAVENOUS | Status: AC
  Administered 2018-12-25: 5 10*6.[IU] via INTRAVENOUS
  Filled 2018-12-25: qty 5

## 2018-12-25 MED ORDER — LACTATED RINGERS IV SOLN: 500.0000 mL | INTRAVENOUS | Status: DC | PRN

## 2018-12-25 MED ORDER — IBUPROFEN 600 MG PO TABS
600.0000 mg | ORAL_TABLET | Freq: Four times a day (QID) | ORAL | Status: DC
  Administered 2018-12-25 – 2018-12-27 (×9): 600 mg via ORAL
  Filled 2018-12-25 (×9): qty 1

## 2018-12-25 MED ORDER — OXYTOCIN 10 UNIT/ML IJ SOLN
INTRAMUSCULAR | Status: AC
  Filled 2018-12-25: qty 2

## 2018-12-25 MED ORDER — FERROUS SULFATE 325 (65 FE) MG PO TABS
325.0000 mg | ORAL_TABLET | Freq: Every day | ORAL | Status: DC
  Administered 2018-12-26 – 2018-12-27 (×2): 325 mg via ORAL
  Filled 2018-12-25 (×2): qty 1

## 2018-12-25 MED ORDER — PENICILLIN G 3 MILLION UNITS IVPB - SIMPLE MED: 3.0000 10*6.[IU] | INTRAVENOUS | Status: DC

## 2018-12-25 MED ORDER — BUTORPHANOL TARTRATE 2 MG/ML IJ SOLN
1.0000 mg | INTRAMUSCULAR | Status: DC | PRN
  Administered 2018-12-25: 1 mg via INTRAVENOUS
  Filled 2018-12-25: qty 1

## 2018-12-25 MED ORDER — AMMONIA AROMATIC IN INHA
RESPIRATORY_TRACT | Status: AC
  Filled 2018-12-25: qty 10

## 2018-12-25 MED ORDER — WITCH HAZEL-GLYCERIN EX PADS: 1.0000 "application " | MEDICATED_PAD | CUTANEOUS | Status: DC | PRN

## 2018-12-25 MED ORDER — LIDOCAINE HCL (PF) 1 % IJ SOLN
INTRAMUSCULAR | Status: AC
  Filled 2018-12-25: qty 30

## 2018-12-25 MED ORDER — PRENATAL MULTIVITAMIN CH
1.0000 | ORAL_TABLET | Freq: Every day | ORAL | Status: DC
  Administered 2018-12-26 – 2018-12-27 (×2): 1 via ORAL
  Filled 2018-12-25 (×2): qty 1

## 2018-12-25 MED ORDER — BENZOCAINE-MENTHOL 20-0.5 % EX AERO
1.0000 "application " | INHALATION_SPRAY | CUTANEOUS | Status: DC | PRN
  Administered 2018-12-25: 1 via TOPICAL
  Filled 2018-12-25: qty 56

## 2018-12-25 MED ORDER — LIDOCAINE HCL (PF) 1 % IJ SOLN: 30.0000 mL | INTRAMUSCULAR | Status: DC | PRN

## 2018-12-25 MED ORDER — ONDANSETRON HCL 4 MG PO TABS: 4.0000 mg | ORAL_TABLET | ORAL | Status: DC | PRN

## 2018-12-25 MED ORDER — DIBUCAINE (PERIANAL) 1 % EX OINT: 1.0000 "application " | TOPICAL_OINTMENT | CUTANEOUS | Status: DC | PRN

## 2018-12-25 MED ORDER — LACTATED RINGERS IV SOLN
INTRAVENOUS | Status: DC
  Administered 2018-12-25: 16:00:00 via INTRAVENOUS

## 2018-12-25 MED ORDER — SIMETHICONE 80 MG PO CHEW: 80.0000 mg | CHEWABLE_TABLET | ORAL | Status: DC | PRN

## 2018-12-25 MED ORDER — ONDANSETRON HCL 4 MG/2ML IJ SOLN: 4.0000 mg | INTRAMUSCULAR | Status: DC | PRN

## 2018-12-25 MED ORDER — COCONUT OIL OIL
1.0000 "application " | TOPICAL_OIL | Status: DC | PRN
  Administered 2018-12-26: 1 via TOPICAL
  Filled 2018-12-25: qty 120

## 2018-12-25 MED ORDER — MISOPROSTOL 200 MCG PO TABS: 800.0000 ug | ORAL_TABLET | Freq: Once | ORAL | Status: DC | PRN

## 2018-12-25 MED ORDER — MISOPROSTOL 200 MCG PO TABS
ORAL_TABLET | ORAL | Status: AC
  Filled 2018-12-25: qty 4

## 2018-12-25 MED ORDER — OXYTOCIN 40 UNITS IN NORMAL SALINE INFUSION - SIMPLE MED
2.5000 [IU]/h | INTRAVENOUS | Status: DC
  Administered 2018-12-25: 2.5 [IU]/h via INTRAVENOUS
  Filled 2018-12-25: qty 1000

## 2018-12-25 NOTE — OB Triage Note (Signed)
Pt. Presented to L/D triage with reported contractions since 0330 today. The pain was unrelieved by heat and rest.Pain 7/10.  LOF noted at 1300. No bleeding. SROM noticed upon arrival with moderate meconium. She has not noticed as much fetal movement. VSS. Will continue to monitor.

## 2018-12-25 NOTE — Discharge Summary (Signed)
Physician Obstetric Discharge Summary  Patient ID: Molly Graves MRN: [id number] DOB/AGE: April 09, 1991 28 y.o.   Date of Admission: 12/25/2018 Date of Delivery: 12/25/2018 Date of Discharge: 12/27/2018  Admitting Diagnosis: Onset of Labor at [redacted]w[redacted]d  Secondary Diagnosis: meconium stained amniotic fluid  Mode of Delivery: normal spontaneous vaginal delivery 12/25/2018      Discharge Diagnosis: Term intrauterine pregnancy-delivered   Intrapartum Procedures: GBS prophylaxis   Post partum procedures: None  Complications: thick MSAF   Brief Hospital Course  Molly Graves is a [id number] who had a SVD on 12/25/2018;  for further details of this delivery, please refer to the delivery note.  Patient had an uncomplicated postpartum course.  By time of discharge on PPD#2, her pain was controlled on oral pain medications; she had appropriate lochia and was ambulating, voiding without difficulty and tolerating regular diet.  She was deemed stable for discharge to home.     Labs: CBC Latest Ref Rng & Units 12/26/2018 12/25/2018 10/28/2018  WBC 4.0 - 10.5 K/uL 13.9(H) 11.4(H) 9.1  Hemoglobin 12.0 - 15.0 g/dL 11.0(L) 12.5 11.2(L)  Hematocrit 36.0 - 46.0 % 33.7(L) 38.0 35.0(L)  Platelets 150 - 400 K/uL 225 248 219   O POS Performed at [facility], [address] [place], [place] [address]   Physical exam:  Blood pressure (!) 101/57, pulse 71, temperature 98.4 F (36.9 C), temperature source Oral, resp. rate 18, height 5\' 4"  (1.626 m), weight 88.5 kg, last menstrual period 04/06/2018, SpO2 99 %, unknown if currently breastfeeding. General: alert and no distress Lochia: appropriate Abdomen: soft, NT Uterine Fundus: firm Extremities: No evidence of DVT seen on physical exam. No lower extremity edema.  Discharge Instructions: Per After Visit Summary. Activity: Advance as tolerated. Pelvic rest for 6 weeks.  Also refer to Discharge Instructions Diet: Regular Medications: Allergies as of  12/27/2018   No Known Allergies     Medication List    STOP taking these medications   acetaminophen 500 MG tablet Commonly known as: TYLENOL   Doxylamine-Pyridoxine 10-10 MG Tbec Commonly known as: Diclegis   nitrofurantoin (macrocrystal-monohydrate) 100 MG capsule Commonly known as: MACROBID   valACYclovir 500 MG tablet Commonly known as: VALTREX     TAKE these medications   FLUoxetine 20 MG capsule Commonly known as: PROZAC Take 1 capsule (20 mg total) by mouth daily.   Vitafol FE+ 90-0.6-0.4-200 MG Caps Take 1 tablet by mouth daily.      Outpatient follow up:  Follow-up Information    Dalia Heading, CNM. Schedule an appointment as soon as possible for a visit.   Specialty: Certified Nurse Midwife Why: for 2 week appointment for emotional check Contact information: [place] [address] [phone]          Postpartum contraception: OCP  Discharged Condition: good  Discharged to: home   Newborn Data: Disposition:pending, home with mother or mother will board in room  Apgars: APGAR (1 MIN): 8   APGAR (5 MINS): 9 7 lb 3.3 oz    Baby Feeding: Breast  [name], CNM 12/27/2018 10:30 AM

## 2018-12-25 NOTE — H&P (Signed)
OB History & Physical   History of Present Illness:  Chief Complaint:   HPI:  Molly Graves is a 28 y.o. [phone] female with EDC=01/11/2019 at [redacted]w[redacted]d dated by her LMP=9wk ultrasound.  Her pregnancy has been complicated by admissions for pyelonephritis and depression/suicidal ideation, a history of HSV, iron deficiency anemia (received two IV iron infusions) and GBS positive culture. She has been noncompliant with her medications and has stopped taking Prozac and Macrobid suppression and has not started her Valtrex for HSV PPX or oral iron supplementation. She denies any symptoms of HSV outbreak at this time.  EFW yesterday was 6#10 oz on ultrasound.  She presents to L&D for evaluation of labor. She reports starting to contract during the night, then she started leaking amniotic fluid at 1300 and that is when her contractions got more intense and more frequent.  Denies vaginal bleeding. Prenatal care site: Prenatal care at [place] has also been remarkable for   Clinic [facility] Prenatal Labs  Dating LMP=9wkUS Blood type: O/Positive/-- (12/23 1605)   Genetic Screen declines Antibody:Negative (12/23 1605)  Anatomic [place]  incomplete Rubella: 1.86 (12/23 1605) Varicella:    GTT Third trimester:  RPR: Non Reactive (12/23 1605)   Rhogam O+ HBsAg: Negative (12/23 1605)   TDaP vaccine       12/08/18       Flu Shot: HIV: Non Reactive (12/23 1605)   Baby Food           Breast                     GBS: POSITIVE  Contraception Declines BTL, ?PILL WIO:XBDZH PP  CBB     CS/VBAC na   Support Person          Past OB history: Significant for postpartum depression after her first delivery and for mild shoulder dystocia following IFD with her second pregnancy. (Second baby weighed 8#9oz) OB History  Gravida Para Term Preterm AB Living  5 2 2  0 2 2  SAB TAB Ectopic Multiple Live Births  1 1 0 0 2    # Outcome Date GA Lbr Len/2nd Weight Sex Delivery Anes PTL Lv  5 Current           4 Term 08/04/15 [redacted]w[redacted]d  06:34 / 01:25 3900 g M Vag-Forceps EPI  LIV     Birth Comments: terminal meconium/ voided     Complications: Shoulder Dystocia  3 Term 10/14/09 [redacted]w[redacted]d  3260 g F Vag-Spont  N LIV  2 SAB           1 TAB            Maternal Medical History:   Past Medical History:  Diagnosis Date  . Anemia   . Depression    post partum depression  . Headache    MIGRAINES  . Herpes genitalia     Past Surgical History:  Procedure Laterality Date  . BREAST SURGERY     lumpectomy left breast  . DIAGNOSTIC LAPAROSCOPY  2015   to rule out uterine injury after D&E  . HYSTEROSCOPY W/D&C N/A 04/19/2016   Procedure: DILATATION AND CURETTAGE /HYSTEROSCOPY;  Surgeon: Gae Dry, MD;  Location: [facility] ORS;  Service: Gynecology;  Laterality: N/A;  . INDUCED ABORTION  2015  . IUD REMOVAL N/A 04/19/2016   Procedure: INTRAUTERINE DEVICE (IUD) REMOVAL;  Surgeon: Gae Dry, MD;  Location: [facility] ORS;  Service: Gynecology;  Laterality: N/A;  . LAPAROSCOPY N/A 04/19/2016  Procedure: LAPAROSCOPY DIAGNOSTIC;  Surgeon: Nadara Mustardobert P Harris, MD;  Location: [facility] ORS;  Service: Gynecology;  Laterality: N/A;    No Known Allergies  Medication: The only medication she took recently was Tylenol.  Prior to Admission medications   Medication Sig Start Date End Date Taking? Authorizing Provider  acetaminophen (TYLENOL) 500 MG tablet Take 1,000 mg by mouth every 6 (six) hours as needed.   Yes [provider]  Doxylamine-Pyridoxine (DICLEGIS) 10-10 MG TBEC Take 2 tablets by mouth at bedtime. If symptoms persist, add one tablet in the morning and one in the afternoon 07/06/18  Yes Nadara MustardHarris, Robert P, MD  nitrofurantoin, macrocrystal-monohydrate, (MACROBID) 100 MG capsule Take 1 capsule (100 mg total) by mouth 2 (two) times daily. 12/13/18  Yes Vena AustriaStaebler, Andreas, MD  Prenat-Fe Poly-Methfol-FA-DHA (VITAFOL FE+) 90-0.6-0.4-200 MG CAPS Take 1 tablet by mouth daily. 08/26/18  Yes Schuman, Christanna R, MD  FLUoxetine (PROZAC) 20 MG  capsule Take 1 capsule (20 mg total) by mouth daily. Patient not taking: Reported on 12/08/2018 10/12/18   Clapacs, Jackquline DenmarkJohn T, MD  valACYclovir (VALTREX) 500 MG tablet Take 1 tablet (500 mg total) by mouth 2 (two) times daily. Patient not taking: Reported on 12/25/2018 12/21/18   Natale MilchSchuman, Christanna R, MD          Social History: She  reports that she quit smoking about 8 months ago. Her smoking use included cigars. She quit after 7.00 years of use. She has never used smokeless tobacco. She reports previous alcohol use. She reports that she does not use drugs.  Family History: family history includes Cancer in her maternal uncle and paternal aunt; Cancer (age of onset: 2429) in an other family member; Depression in her mother; Hypertension in her maternal aunt, maternal grandmother, and mother.   Review of Systems: Negative x 10 systems reviewed except as noted in the HPI.      Physical Exam:  Vital Signs: BP 108/70 (BP Location: Right Arm)   Pulse 89   Temp 97.9 F (36.6 C) (Oral)   Resp 18   Ht 5\' 4"  (1.626 m)   Wt 88.5 kg   LMP 04/06/2018 (Exact Date)   BMI 33.47 kg/m  General: appears very comfortable with contractions, grimacing, grabbing the side rails.  HEENT: normocephalic, atraumatic Heart: regular rate & rhythm.  No murmurs/rubs/gallops Lungs: clear to auscultation bilaterally Abdomen: soft, gravid, non-tender;  EFW: 6#10 oz/ cephalic Pelvic:   External: Normal external female genitalia, no ulcerations or papules seen.  Vagina: gross rupture with thick meconium stained amniotic fluid, no lesions seen vaginally  Cervix: Dilation: 2 / Effacement (%): 60 / Station: -2   Extremities: non-tender, symmetric, no edema bilaterally.   Neurologic: Alert & oriented x 3.   Baseline FHR: 135-140 with accelerations and moderate variability, repetitive early/ mild variable declerations decelerations/ Toco: contractions every 2 minutes.    Assessment:  Molly Graves is a 28 y.o.  [phone] female at 9322w4d in labor Thick meconium stained amniotic fluid FWB: Cat1, cat2  History of HSV-not on antiviral, but with no symptoms and negative SSE. Depression, not currently on medication. Plan:  1. Admit to Labor & Delivery -anticipate vaginal delivery 2. Move to LDR when results of Covid testing back 3. CBC, T&S, Clrs, IVF 4. GBS positive-PCN PPX started.   5. Consents obtained. 6. Stadol for pain, epidural when avialable 7. O POS/ RI/VI 8. Breast 9. Pills vs IUD 10. TDAP: received antepartum   Farrel ConnersColleen Venecia Mehl  12/25/2018 4:03 PM

## 2018-12-25 NOTE — Discharge Instructions (Signed)
°Vaginal Delivery, Care After °Refer to this sheet in the next few weeks. These discharge instructions provide you with information on caring for yourself after delivery. Your caregiver may also give you specific instructions. Your treatment has been planned according to the most current medical practices available, but problems sometimes occur. Call your caregiver if you have any problems or questions after you go home. °HOME CARE INSTRUCTIONS °1. Take over-the-counter or prescription medicines only as directed by your caregiver or pharmacist. °2. Do not drink alcohol, especially if you are breastfeeding or taking medicine to relieve pain. °3. Do not smoke tobacco. °4. Continue to use good perineal care. Good perineal care includes: °1. Wiping your perineum from back to front °2. Keeping your perineum clean. °3. You can do sitz baths twice a day, to help keep this area clean °5. Do not use tampons, douche or have sex for 6 weeks °6. Shower only and avoid sitting in submerged water, aside from sitz baths °7. Wear a well-fitting bra that provides breast support. °8. Eat healthy foods. °9. Drink enough fluids to keep your urine clear or pale yellow. °10. Eat high-fiber foods such as whole grain cereals and breads, brown rice, beans, and fresh fruits and vegetables every day. These foods may help prevent or relieve constipation. °11. Avoid constipation with high fiber foods or medications, such as miralax or metamucil °12. Follow your caregiver's recommendations regarding resumption of activities such as climbing stairs, driving, lifting, exercising, or traveling. °13. Talk to your caregiver about resuming sexual activities. Resumption of sexual activities after 6 weeks is dependent upon your risk of infection, your rate of healing, and your comfort and desire to resume sexual activity. °14. Try to have someone help you with your household activities and your newborn for at least a few days after you leave the  hospital. °15. Rest as much as possible. Try to rest or take a nap when your newborn is sleeping. °16. Increase your activities gradually. °17. Keep all of your scheduled postpartum appointments. It is very important to keep your scheduled follow-up appointments. At these appointments, your caregiver will be checking to make sure that you are healing physically and emotionally. °SEEK MEDICAL CARE IF:  °· You are passing large clots from your vagina. Save any clots to show your caregiver. °· You have a foul smelling discharge from your vagina. °· You have trouble urinating. °· You are urinating frequently. °· You have pain when you urinate. °· You have a change in your bowel movements. °· You have increasing redness, pain, or swelling near your vaginal incision (episiotomy) or vaginal tear. °· You have pus draining from your episiotomy or vaginal tear. °· Your episiotomy or vaginal tear is separating. °· You have painful, hard, or reddened breasts. °· You have a severe headache. °· You have blurred vision or see spots. °· You feel sad or depressed. °· You have thoughts of hurting yourself or your newborn. °· You have questions about your care, the care of your newborn, or medicines. °· You are dizzy or light-headed. °· You have a rash. °· You have nausea or vomiting. °· You were breastfeeding and have not had a menstrual period within 12 weeks after you stopped breastfeeding. °· You are not breastfeeding and have not had a menstrual period by the 12th week after delivery. °· You have a fever of 100.5 or more °SEEK IMMEDIATE MEDICAL CARE IF:  °· You have persistent pain. °· You have chest pain. °· You have shortness   of breath. °· You faint. °· You have leg pain. °· You have stomach pain. °· Your vaginal bleeding saturates two or more sanitary pads in 1 hour. °MAKE SURE YOU:  °· Understand these instructions. °· Will watch your condition. °· Will get help right away if you are not doing well or get worse. °Document  Released: 05/31/2000 Document Revised: 10/18/2013 Document Reviewed: 01/29/2012 °ExitCare® Patient Information ©2015 [employer]. This information is not intended to replace advice given to you by your health care provider. Make sure you discuss any questions you have with your health care provider. ° °Sitz Bath °A sitz bath is a warm water bath taken in the sitting position. The water covers only the hips and butt (buttocks). We recommend using one that fits in the toilet, to help with ease of use and cleanliness. It may be used for either healing or cleaning purposes. Sitz baths are also used to relieve pain, itching, or muscle tightening (spasms). The water may contain medicine. Moist heat will help you heal and relax.  °HOME CARE  °Take 3 to 4 sitz baths a day. °18. Fill the bathtub half-full with warm water. °19. Sit in the water and open the drain a little. °20. Turn on the warm water to keep the tub half-full. Keep the water running constantly. °21. Soak in the water for 15 to 20 minutes. °22. After the sitz bath, pat the affected area dry. °GET HELP RIGHT AWAY IF: °You get worse instead of better. Stop the sitz baths if you get worse. °MAKE SURE YOU: °· Understand these instructions. °· Will watch your condition. °· Will get help right away if you are not doing well or get worse. °Document Released: 07/11/2004 Document Revised: 02/26/2012 Document Reviewed: 10/01/2010 °ExitCare® Patient Information ©2015 [employer]. This information is not intended to replace advice given to you by your health care provider. Make sure you discuss any questions you have with your health care provider. ° ° °

## 2018-12-25 NOTE — Telephone Encounter (Signed)
Pt calling office c/o reg ctxs since last night; 6-7 min apart; may be leaking fluid as well.  Adv to go to L&D via ED.  Mary notified.

## 2018-12-26 LAB — CBC
HCT: 33.7 % — ABNORMAL LOW (ref 36.0–46.0)
Hemoglobin: 11 g/dL — ABNORMAL LOW (ref 12.0–15.0)
MCH: 28.1 pg (ref 26.0–34.0)
MCHC: 32.6 g/dL (ref 30.0–36.0)
MCV: 86.2 fL (ref 80.0–100.0)
Platelets: 225 10*3/uL (ref 150–400)
RBC: 3.91 MIL/uL (ref 3.87–5.11)
RDW: 15.8 % — ABNORMAL HIGH (ref 11.5–15.5)
WBC: 13.9 10*3/uL — ABNORMAL HIGH (ref 4.0–10.5)
nRBC: 0 % (ref 0.0–0.2)

## 2018-12-26 LAB — RPR: RPR Ser Ql: NONREACTIVE

## 2018-12-26 NOTE — Progress Notes (Signed)
PPD#1 SVD Subjective:  Ambulating in room, no dizziness.  Pain control is good with medication. Voiding without difficulty. Tolerating a regular diet.   Objective:   Blood pressure (!) 98/50, pulse 70, temperature 98.7 F (37.1 C), temperature source Oral, resp. rate 18, height 5\' 4"  (1.626 m), weight 88.5 kg, last menstrual period 04/06/2018, SpO2 98 %, currently breastfeeding.  General: NAD Pulmonary: no increased work of breathing Abdomen: non-distended, non-tender Uterus:  fundus firm; lochia appropriate Extremities: no edema, no erythema, no tenderness, no signs of DVT  Results for orders placed or performed during the hospital encounter of 12/25/18 (from the past 72 hour(s))  CBC     Status: Abnormal   Collection Time: 12/25/18  3:27 PM  Result Value Ref Range   WBC 11.4 (H) 4.0 - 10.5 K/uL   RBC 4.50 3.87 - 5.11 MIL/uL   Hemoglobin 12.5 12.0 - 15.0 g/dL   HCT 40.938.0 81.136.0 - 91.446.0 %   MCV 84.4 80.0 - 100.0 fL   MCH 27.8 26.0 - 34.0 pg   MCHC 32.9 30.0 - 36.0 g/dL   RDW 78.215.6 (H) 95.611.5 - 21.315.5 %   Platelets 248 150 - 400 K/uL   nRBC 0.0 0.0 - 0.2 %    Comment: Performed at [facility], [address] [place], [place] [address]  RPR     Status: None   Collection Time: 12/25/18  3:27 PM  Result Value Ref Range   RPR Ser Ql Non Reactive Non Reactive    Comment: (NOTE) Performed At: [facility] [address] [place], [place] [id number] Jolene SchimkeNagendra Sanjai MD WU:[id number]:[phone]   SARS Coronavirus 2 (CEPHEID - Performed in [facility]), Hosp Order     Status: None   Collection Time: 12/25/18  3:32 PM   Specimen: Nasopharyngeal Swab  Result Value Ref Range   SARS Coronavirus 2 NEGATIVE NEGATIVE    Comment: (NOTE) If result is NEGATIVE SARS-CoV-2 target nucleic acids are NOT DETECTED. The SARS-CoV-2 RNA is generally detectable in upper and lower  respiratory specimens during the acute phase of infection. The lowest  concentration of SARS-CoV-2 viral  copies this assay can detect is 250  copies / mL. A negative result does not preclude SARS-CoV-2 infection  and should not be used as the sole basis for treatment or other  patient management decisions.  A negative result may occur with  improper specimen collection / handling, submission of specimen other  than nasopharyngeal swab, presence of viral mutation(s) within the  areas targeted by this assay, and inadequate number of viral copies  (<250 copies / mL). A negative result must be combined with clinical  observations, patient history, and epidemiological information. If result is POSITIVE SARS-CoV-2 target nucleic acids are DETECTED. The SARS-CoV-2 RNA is generally detectable in upper and lower  respiratory specimens dur ing the acute phase of infection.  Positive  results are indicative of active infection with SARS-CoV-2.  Clinical  correlation with patient history and other diagnostic information is  necessary to determine patient infection status.  Positive results do  not rule out bacterial infection or co-infection with other viruses. If result is PRESUMPTIVE POSTIVE SARS-CoV-2 nucleic acids MAY BE PRESENT.   A presumptive positive result was obtained on the submitted specimen  and confirmed on repeat testing.  While 2019 novel coronavirus  (SARS-CoV-2) nucleic acids may be present in the submitted sample  additional confirmatory testing may be necessary for epidemiological  and / or clinical management purposes  to differentiate between  SARS-CoV-2 and other Sarbecovirus currently known to infect humans.  If clinically indicated additional testing with an alternate test  methodology [phone]) is advised. The SARS-CoV-2 RNA is generally  detectable in upper and lower respiratory sp ecimens during the acute  phase of infection. The expected result is Negative. Fact Sheet for Patients:  StrictlyIdeas.no Fact Sheet for Healthcare  Providers: BankingDealers.co.za This test is not yet approved or cleared by the [place] FDA and has been authorized for detection and/or diagnosis of SARS-CoV-2 by FDA under an Emergency Use Authorization (EUA).  This EUA will remain in effect (meaning this test can be used) for the duration of the COVID-19 declaration under Section 564(b)(1) of the Act, 21 U.S.C. section 360bbb-3(b)(1), unless the authorization is terminated or revoked sooner. Performed at [facility], [address] [place], [place] [address]   ABO/Rh     Status: None   Collection Time: 12/25/18  3:46 PM  Result Value Ref Range   ABO/RH(D)      O POS Performed at [facility], [address] [place], [place] [address]   Antibody screen     Status: None   Collection Time: 12/25/18  3:46 PM  Result Value Ref Range   Antibody Screen      NEG Performed at [facility], [address] [place], [place] [address]   CBC     Status: Abnormal   Collection Time: 12/26/18  6:17 AM  Result Value Ref Range   WBC 13.9 (H) 4.0 - 10.5 K/uL   RBC 3.91 3.87 - 5.11 MIL/uL   Hemoglobin 11.0 (L) 12.0 - 15.0 g/dL   HCT 33.7 (L) 36.0 - 46.0 %   MCV 86.2 80.0 - 100.0 fL   MCH 28.1 26.0 - 34.0 pg   MCHC 32.6 30.0 - 36.0 g/dL   RDW 15.8 (H) 11.5 - 15.5 %   Platelets 225 150 - 400 K/uL   nRBC 0.0 0.0 - 0.2 %    Comment: Performed at [facility], [address], [place], [place] [address]    Assessment:   28 y.o. [id number] postpartum day # 1 recovering well.  Plan:   1) Acute blood loss anemia - hemodynamically stable and asymptomatic - PO ferrous sulfate  2) Blood Type --/--/O POS Performed at [facility], [address] [place], [place] [address]  [phone]) /   3) Rubella 1.86 (12/23 1605) / Varicella Immune / TDAP status: received antepartum  4) Breastfeeding  5) Contraception: not discussed  6) Disposition: continue postpartum  care. Social work to see tomorrow morning. Anticipate discharge tomorrow.  Avel Sensor, CNM 12/26/2018  9:20 AM

## 2018-12-26 NOTE — Lactation Note (Signed)
This note was copied from a baby's chart. Lactation Consultation Note  Patient Name: Girl Zariel Capano [id number] Date: 12/26/2018 Reason for consult: Initial assessment   Maternal Data Formula Feeding for Exclusion: No Has patient been taught Hand Expression?: Yes Does the patient have breastfeeding experience prior to this delivery?: Yes  Feeding Feeding Type: Breast Fed Assisted with latching to left breast, nipple more tender, baby gassy and fussy , passing gas, able to latch in football hold, baby needed gentle pressure on jaw to get deeper latch and decrease pain to nipple  LATCH Score Latch: Repeated attempts needed to sustain latch, nipple held in mouth throughout feeding, stimulation needed to elicit sucking reflex.(baby fussy and gassy)  Audible Swallowing: Spontaneous and intermittent  Type of Nipple: Everted at rest and after stimulation(sl flat, small, large areola)  Comfort (Breast/Nipple): Filling, red/small blisters or bruises, mild/mod discomfort(left nipple)  Hold (Positioning): Assistance needed to correctly position infant at breast and maintain latch.  LATCH Score: 7  Interventions Interventions: Breast feeding basics reviewed;Assisted with latch;Skin to skin;Hand express;Breast compression;Adjust position;Support pillows;Position options;Coconut oil  Lactation Tools Discussed/Used WIC Program: Yes   Consult Status Consult Status: PRN    Ferol Luz 12/26/2018, 11:45 AM

## 2018-12-26 NOTE — Progress Notes (Signed)
Note copied from infant's chart:  Infant coombs positive. TCB 6.8 at 14 hours of age. Pediatrician updated. TSB to be done at 2pm, per Dr. Cleda Mccreedy. Mom states infant breastfeeds for 30-40 minutes per feed but she wants to pump as well. Mom also asked to see lactation. Pumping education to be done with mom. Rudd consulted and to assess patient.   Social worker, Junie Panning, to see mom tomorrow morning for suicidal ideations during pregnancy. RN to continue to monitor.

## 2018-12-27 NOTE — Progress Notes (Signed)
CSW received consult due to score 11 on [place] Depression Screen.    CSW met with MOB due to [place] score hospitalization during pregnancy for suicidal thoughts. MOB voiced no current concerns for anxiety/ depression. Patients partner was present in room however was sleeping- MOB voiced having plenty of support at home and stated her aunt is a great help. MOB had 2 other children, 9 and 4, who are currently staying with her aunt. MOB was open with CSW regarding her history with depression and was previously taking Prozac but stated she stopped taking it a "few weeks ago" but plans to start taking it once she gets home. MOB stated that she has discussed this with her physician and feels comfortable taking it. CSW encouraged MOB to monitor her emotions and notify her PCP/ OBGYN if she had an increase in anxiety/ depression.   CSW provided education regarding Baby Blues vs PMADs and provided MOB with resources for mental health follow up.  CSW encouraged MOB to evaluate her mental health throughout the postpartum period with the use of the New Mom Checklist developed by Postpartum Progress as well as the [place] Postnatal Depression Scale and notify a medical professional if symptoms arise.     Kingsley Spittle, LCSW [facility]  [phone]

## 2018-12-27 NOTE — Progress Notes (Signed)
Discharge instructions given. Patient verbalizes understanding of teaching. Patient discharged home via wheelchair at 1840. 

## 2018-12-27 NOTE — Lactation Note (Signed)
This note was copied from a baby's chart. Lactation Consultation Note  Patient Name: Molly Graves [id number] Date: 12/27/2018   Observed mom breast feeding well without assistance.  Mom still reports tender nipples.  Mom already has coconut oil.  Comfort gels given and instructed in alternating use.  Mom is diligent with putting her to the breast whenever she demonstrated feeding cues.  Reviewed supply and demand, normal course of lactation and routine newborn feeding patterns.  Mom and baby may go home today if bilirubin does not rebound.  Lactation community resources reviewed with contact numbers given and encouraged to call with any questions, concerns or assistance.  Maternal Data    Feeding Feeding Type: Breast Fed  LATCH Score                   Interventions    Lactation Tools Discussed/Used     Consult Status      Jarold Motto 12/27/2018, 5:58 PM

## 2018-12-31 ENCOUNTER — Encounter: Payer: Medicaid Other | Admitting: Maternal Newborn

## 2019-01-11 ENCOUNTER — Inpatient Hospital Stay: Admit: 2019-01-11 | Payer: Self-pay

## 2019-01-22 ENCOUNTER — Ambulatory Visit: Payer: Medicaid Other | Admitting: Certified Nurse Midwife

## 2019-01-25 ENCOUNTER — Other Ambulatory Visit: Payer: Medicaid Other

## 2019-01-26 ENCOUNTER — Inpatient Hospital Stay: Payer: Medicaid Other | Attending: Oncology

## 2019-01-27 ENCOUNTER — Inpatient Hospital Stay: Payer: Medicaid Other | Admitting: Oncology

## 2019-02-02 ENCOUNTER — Other Ambulatory Visit: Payer: Self-pay

## 2019-02-03 ENCOUNTER — Other Ambulatory Visit: Payer: Self-pay

## 2019-02-03 ENCOUNTER — Inpatient Hospital Stay: Payer: Medicaid Other | Attending: Oncology

## 2019-02-03 DIAGNOSIS — D508 Other iron deficiency anemias: Secondary | ICD-10-CM | POA: Diagnosis not present

## 2019-02-03 DIAGNOSIS — D509 Iron deficiency anemia, unspecified: Secondary | ICD-10-CM

## 2019-02-03 LAB — IRON AND TIBC
Iron: 39 ug/dL (ref 28–170)
Saturation Ratios: 11 % (ref 10.4–31.8)
TIBC: 371 ug/dL (ref 250–450)
UIBC: 332 ug/dL

## 2019-02-03 LAB — COMPREHENSIVE METABOLIC PANEL
ALT: 14 U/L (ref 0–44)
AST: 17 U/L (ref 15–41)
Albumin: 4 g/dL (ref 3.5–5.0)
Alkaline Phosphatase: 76 U/L (ref 38–126)
Anion gap: 7 (ref 5–15)
BUN: 18 mg/dL (ref 6–20)
CO2: 22 mmol/L (ref 22–32)
Calcium: 9.4 mg/dL (ref 8.9–10.3)
Chloride: 108 mmol/L (ref 98–111)
Creatinine, Ser: 0.88 mg/dL (ref 0.44–1.00)
GFR calc Af Amer: >60 mL/min (ref >60)
GFR calc non Af Amer: >60 mL/min (ref >60)
Glucose, Bld: 107 mg/dL — ABNORMAL HIGH (ref 70–99)
Potassium: 4 mmol/L (ref 3.5–5.1)
Sodium: 137 mmol/L (ref 135–145)
Total Bilirubin: 0.6 mg/dL (ref 0.3–1.2)
Total Protein: 7.7 g/dL (ref 6.5–8.1)

## 2019-02-03 LAB — CBC WITH DIFFERENTIAL/PLATELET
Abs Immature Granulocytes: 0.02 10*3/uL (ref 0.00–0.07)
Basophils Absolute: 0 10*3/uL (ref 0.0–0.1)
Basophils Relative: 0 %
Eosinophils Absolute: 0.3 10*3/uL (ref 0.0–0.5)
Eosinophils Relative: 5 %
HCT: 42.9 % (ref 36.0–46.0)
Hemoglobin: 14.1 g/dL (ref 12.0–15.0)
Immature Granulocytes: 0 %
Lymphocytes Relative: 33 %
Lymphs Abs: 2.1 10*3/uL (ref 0.7–4.0)
MCH: 28.3 pg (ref 26.0–34.0)
MCHC: 32.9 g/dL (ref 30.0–36.0)
MCV: 86.1 fL (ref 80.0–100.0)
Monocytes Absolute: 0.5 10*3/uL (ref 0.1–1.0)
Monocytes Relative: 8 %
Neutro Abs: 3.3 10*3/uL (ref 1.7–7.7)
Neutrophils Relative %: 54 %
Platelets: 264 10*3/uL (ref 150–400)
RBC: 4.98 MIL/uL (ref 3.87–5.11)
RDW: 14.6 % (ref 11.5–15.5)
WBC: 6.2 10*3/uL (ref 4.0–10.5)
nRBC: 0 % (ref 0.0–0.2)

## 2019-02-03 LAB — FERRITIN: Ferritin: 31 ng/mL (ref 11–307)

## 2019-02-04 ENCOUNTER — Inpatient Hospital Stay (HOSPITAL_BASED_OUTPATIENT_CLINIC_OR_DEPARTMENT_OTHER): Payer: Medicaid Other | Admitting: Oncology

## 2019-02-04 ENCOUNTER — Encounter: Payer: Self-pay | Admitting: Oncology

## 2019-02-04 DIAGNOSIS — D508 Other iron deficiency anemias: Secondary | ICD-10-CM | POA: Diagnosis not present

## 2019-02-04 NOTE — Progress Notes (Signed)
HEMATOLOGY-ONCOLOGY TeleHEALTH VISIT PROGRESS NOTE  I connected with Molly Graves on 02/04/19 at  9:15 AM EDT by video enabled telemedicine visit and verified that I am speaking with the correct person using two identifiers. I discussed the limitations, risks, security and privacy concerns of performing an evaluation and management service by telemedicine and the availability of in-person appointments. I also discussed with the patient that there may be a patient responsible charge related to this service. The patient expressed understanding and agreed to proceed.   Other persons participating in the visit and their role in the encounter:  None  Patient's location: Home  Provider's location:  office Chief Complaint: Iron deficiency anemia   INTERVAL HISTORY Molly Graves is a 28 y.o. female who has above history reviewed by me today presents for follow up visit for management of iron deficiency anemia Problems and complaints are listed below:  Patient had virtual visit with me on 10/29/2018 for iron deficiency anemia.  She was found to have severe anemia secondary to iron deficiency in pregnancy. Patient received IV Venofer treatments. She delivered her baby girl on 12/25/2018.  She informs me that she stopped breast-feeding recently. Today she has no new complaints.  She feels well with no new complaints. Fatigue is better. Review of Systems  Constitutional: Negative for appetite change, chills, fatigue and fever.  HENT:   Negative for hearing loss and voice change.   Eyes: Negative for eye problems.  Respiratory: Negative for chest tightness and cough.   Cardiovascular: Negative for chest pain.  Gastrointestinal: Negative for abdominal distention, abdominal pain and blood in stool.  Endocrine: Negative for hot flashes.  Genitourinary: Negative for difficulty urinating and frequency.   Musculoskeletal: Negative for arthralgias.  Skin: Negative for itching and rash.  Neurological:  Negative for extremity weakness.  Hematological: Negative for adenopathy.  Psychiatric/Behavioral: Negative for confusion.    Past Medical History:  Diagnosis Date  . Anemia   . Depression    post partum depression  . Headache    MIGRAINES  . Herpes genitalia    Past Surgical History:  Procedure Laterality Date  . BREAST SURGERY     lumpectomy left breast  . DIAGNOSTIC LAPAROSCOPY  2015   to rule out uterine injury after D&E  . HYSTEROSCOPY W/D&C N/A 04/19/2016   Procedure: DILATATION AND CURETTAGE /HYSTEROSCOPY;  Surgeon: Gae Dry, MD;  Location: [facility] OR[facility];  Service: Gynecology;  Laterality: N/A;  . INDUCED ABORTION  2015  . IUD REMOVAL N/A 04/19/2016   Procedure: INTRAUTERINE DEVICE (IUD) REMOVAL;  Surgeon: Gae Dry, MD;  Location: [facility] OR[facility];  Service: Gynecology;  Laterality: N/A;  . LAPAROSCOPY N/A 04/19/2016   Procedure: LAPAROSCOPY DIAGNOSTIC;  Surgeon: Gae Dry, MD;  Location: [facility] ORS;  Service: Gynecology;  Laterality: N/A;    Family History  Problem Relation Age of Onset  . Hypertension Maternal Aunt   . Hypertension Maternal Grandmother   . Cancer Other 29       Cervical, Malignant  . Depression Mother   . Hypertension Mother   . Cancer Maternal Uncle   . Cancer Paternal Aunt     Social History   Socioeconomic History  . Marital status: Significant Other    Spouse name: Jamal  . Number of children: 2  . Years of education: Not on file  . Highest education level: Not on file  Occupational History  . Not on file  Social Needs  . Financial resource strain: Not on file  .  Food insecurity    Worry: Never true    Inability: Never true  . Transportation needs    Medical: No    Non-medical: No  Tobacco Use  . Smoking status: Former Smoker    Years: 7.00    Types: Cigars    Quit date: 04/17/2018    Years since quitting: 0.8  . Smokeless tobacco: Never Used  Substance and Sexual Activity  . Alcohol use: Not Currently  . Drug use: No  .  Sexual activity: Yes    Partners: Male    Birth control/protection: Pill  Lifestyle  . Physical activity    Days per week: 6 days    Minutes per session: Not on file  . Stress: Rather much  Relationships  . Social connections    Talks on phone: More than three times a week    Gets together: More than three times a week    Attends religious service: Never    Active member of club or organization: Yes    Attends meetings of clubs or organizations: Never    Relationship status: Living with partner  . Intimate partner violence    Fear of current or ex partner: No    Emotionally abused: No    Physically abused: No    Forced sexual activity: No  Other Topics Concern  . Not on file  Social History Narrative  . Not on file    Current Outpatient Medications on File Prior to Visit  Medication Sig Dispense Refill  . Prenat-Fe Poly-Methfol-FA-DHA (VITAFOL FE+) 90-0.6-0.4-200 MG CAPS Take 1 tablet by mouth daily. 30 capsule 11  . FLUoxetine (PROZAC) 20 MG capsule Take 1 capsule (20 mg total) by mouth daily. (Patient not taking: Reported on 12/08/2018) 30 capsule 1   No current facility-administered medications on file prior to visit.     No Known Allergies     Observations/Objective: There were no vitals filed for this visit. There is no height or weight on file to calculate BMI.  Physical Exam  Constitutional: No distress.  Neurological: She is alert.  Psychiatric: Affect normal.    CBC    Component Value Date/Time   WBC 6.2 02/03/2019 1140   RBC 4.98 02/03/2019 1140   HGB 14.1 02/03/2019 1140   HGB 12.8 06/08/2018 1605   HCT 42.9 02/03/2019 1140   HCT 37.4 06/08/2018 1605   PLT 264 02/03/2019 1140   PLT 309 06/08/2018 1605   MCV 86.1 02/03/2019 1140   MCV 83 06/08/2018 1605   MCV 86 06/20/2013 1711   MCH 28.3 02/03/2019 1140   MCHC 32.9 02/03/2019 1140   RDW 14.6 02/03/2019 1140   RDW 13.8 06/08/2018 1605   RDW 13.8 06/20/2013 1711   LYMPHSABS 2.1 02/03/2019 1140    LYMPHSABS 2.0 06/20/2013 1711   MONOABS 0.5 02/03/2019 1140   MONOABS 0.3 06/20/2013 1711   EOSABS 0.3 02/03/2019 1140   EOSABS 0.5 06/20/2013 1711   BASOSABS 0.0 02/03/2019 1140   BASOSABS 0.0 06/20/2013 1711    CMP     Component Value Date/Time   NA 137 02/03/2019 1140   NA 137 06/20/2013 1711   K 4.0 02/03/2019 1140   K 4.2 06/20/2013 1711   CL 108 02/03/2019 1140   CL 107 06/20/2013 1711   CO2 22 02/03/2019 1140   CO2 27 06/20/2013 1711   GLUCOSE 107 (H) 02/03/2019 1140   GLUCOSE 98 06/20/2013 1711   BUN 18 02/03/2019 1140   BUN 8 06/20/2013 1711  CREATININE 0.88 02/03/2019 1140   CREATININE 0.80 06/20/2013 1711   CALCIUM 9.4 02/03/2019 1140   CALCIUM 9.5 06/20/2013 1711   PROT 7.7 02/03/2019 1140   ALBUMIN 4.0 02/03/2019 1140   AST 17 02/03/2019 1140   ALT 14 02/03/2019 1140   ALKPHOS 76 02/03/2019 1140   BILITOT 0.6 02/03/2019 1140   GFRNONAA >60 02/03/2019 1140   GFRNONAA >60 06/20/2013 1711   GFRAA >60 02/03/2019 1140   GFRAA >60 06/20/2013 1711     Assessment and Plan: 1. Other iron deficiency anemia     History of iron deficiency anemia during pregnancy.  Status post IV Venofer. Labs are reviewed and discussed with patient. Hemoglobin has normalized. Iron panel shows improved iron stores.  Hold additional IV iron at this point. Recommend patient to continue Vitafol FE+.   Follow Up Instructions: Follow-up in 3 months with repeat blood work.   I discussed the assessment and treatment plan with the patient. The patient was provided an opportunity to ask questions and all were answered. The patient agreed with the plan and demonstrated an understanding of the instructions.  The patient was advised to call back or seek an in-person evaluation if the symptoms worsen or if the condition fails to improve as anticipated.   I provided 15 minutes of face-to-face video visit time during this encounter, and > 50% was spent counseling as documented under my  assessment & plan.  Rickard PatienceZhou Tarrie Mcmichen, MD 02/04/2019 10:55 AM

## 2019-02-04 NOTE — Progress Notes (Signed)
Called patient for Telehealth visit via [facility].  Patient denies any SOB.  Patient c/o fatigue.

## 2019-02-09 ENCOUNTER — Ambulatory Visit: Payer: Medicaid Other | Admitting: Certified Nurse Midwife

## 2019-02-17 ENCOUNTER — Other Ambulatory Visit: Payer: Self-pay

## 2019-02-17 ENCOUNTER — Inpatient Hospital Stay (HOSPITAL_COMMUNITY)
Admission: EM | Admit: 2019-02-17 | Discharge: 2019-02-20 | DRG: 482 | Disposition: A | Discharge status: Home Health | Payer: Medicaid Other | Attending: Orthopedic Surgery | Admitting: Orthopedic Surgery

## 2019-02-17 ENCOUNTER — Encounter (HOSPITAL_COMMUNITY): Payer: Self-pay

## 2019-02-17 ENCOUNTER — Emergency Department (HOSPITAL_COMMUNITY): Payer: Medicaid Other

## 2019-02-17 DIAGNOSIS — Z87891 Personal history of nicotine dependence: Secondary | ICD-10-CM | POA: Diagnosis not present

## 2019-02-17 DIAGNOSIS — W01190A Fall on same level from slipping, tripping and stumbling with subsequent striking against furniture, initial encounter: Secondary | ICD-10-CM | POA: Diagnosis present

## 2019-02-17 DIAGNOSIS — Z79899 Other long term (current) drug therapy: Secondary | ICD-10-CM

## 2019-02-17 DIAGNOSIS — S72309A Unspecified fracture of shaft of unspecified femur, initial encounter for closed fracture: Secondary | ICD-10-CM | POA: Diagnosis present

## 2019-02-17 DIAGNOSIS — Y92009 Unspecified place in unspecified non-institutional (private) residence as the place of occurrence of the external cause: Secondary | ICD-10-CM | POA: Diagnosis not present

## 2019-02-17 DIAGNOSIS — W19XXXA Unspecified fall, initial encounter: Secondary | ICD-10-CM

## 2019-02-17 DIAGNOSIS — S72332A Displaced oblique fracture of shaft of left femur, initial encounter for closed fracture: Secondary | ICD-10-CM

## 2019-02-17 DIAGNOSIS — G43909 Migraine, unspecified, not intractable, without status migrainosus: Secondary | ICD-10-CM | POA: Diagnosis present

## 2019-02-17 DIAGNOSIS — A6000 Herpesviral infection of urogenital system, unspecified: Secondary | ICD-10-CM | POA: Diagnosis present

## 2019-02-17 DIAGNOSIS — S7292XA Unspecified fracture of left femur, initial encounter for closed fracture: Secondary | ICD-10-CM

## 2019-02-17 DIAGNOSIS — M79652 Pain in left thigh: Secondary | ICD-10-CM | POA: Diagnosis present

## 2019-02-17 DIAGNOSIS — S72302A Unspecified fracture of shaft of left femur, initial encounter for closed fracture: Principal | ICD-10-CM | POA: Diagnosis present

## 2019-02-17 DIAGNOSIS — Z20828 Contact with and (suspected) exposure to other viral communicable diseases: Secondary | ICD-10-CM | POA: Diagnosis present

## 2019-02-17 DIAGNOSIS — S7290XA Unspecified fracture of unspecified femur, initial encounter for closed fracture: Secondary | ICD-10-CM | POA: Diagnosis present

## 2019-02-17 LAB — SARS CORONAVIRUS 2 BY RT PCR (HOSPITAL ORDER, PERFORMED IN ~~LOC~~ HOSPITAL LAB): SARS Coronavirus 2: NEGATIVE

## 2019-02-17 LAB — SURGICAL PCR SCREEN
MRSA, PCR: NEGATIVE
Staphylococcus aureus: NEGATIVE

## 2019-02-17 LAB — CBC
HCT: 42.7 % (ref 36.0–46.0)
Hemoglobin: 14 g/dL (ref 12.0–15.0)
MCH: 29.4 pg (ref 26.0–34.0)
MCHC: 32.8 g/dL (ref 30.0–36.0)
MCV: 89.7 fL (ref 80.0–100.0)
Platelets: 279 10*3/uL (ref 150–400)
RBC: 4.76 MIL/uL (ref 3.87–5.11)
RDW: 14.1 % (ref 11.5–15.5)
WBC: 13.2 10*3/uL — ABNORMAL HIGH (ref 4.0–10.5)
nRBC: 0 % (ref 0.0–0.2)

## 2019-02-17 LAB — BASIC METABOLIC PANEL
Anion gap: 11 (ref 5–15)
BUN: 8 mg/dL (ref 6–20)
CO2: 20 mmol/L — ABNORMAL LOW (ref 22–32)
Calcium: 9.1 mg/dL (ref 8.9–10.3)
Chloride: 107 mmol/L (ref 98–111)
Creatinine, Ser: 0.82 mg/dL (ref 0.44–1.00)
GFR calc Af Amer: >60 mL/min (ref >60)
GFR calc non Af Amer: >60 mL/min (ref >60)
Glucose, Bld: 106 mg/dL — ABNORMAL HIGH (ref 70–99)
Potassium: 3.7 mmol/L (ref 3.5–5.1)
Sodium: 138 mmol/L (ref 135–145)

## 2019-02-17 LAB — I-STAT BETA HCG BLOOD, ED (MC, WL, AP ONLY): I-stat hCG, quantitative: <5 m[IU]/mL (ref <5)

## 2019-02-17 LAB — PREPARE RBC (CROSSMATCH)

## 2019-02-17 LAB — ABO/RH: ABO/RH(D): O POS

## 2019-02-17 LAB — ETHANOL: Alcohol, Ethyl (B): <10 mg/dL (ref <10)

## 2019-02-17 MED ORDER — CELECOXIB 100 MG PO CAPS
200.0000 mg | ORAL_CAPSULE | Freq: Two times a day (BID) | ORAL | Status: DC
  Administered 2019-02-17 – 2019-02-20 (×5): 200 mg via ORAL
  Filled 2019-02-17 (×6): qty 2

## 2019-02-17 MED ORDER — METOCLOPRAMIDE HCL 5 MG/ML IJ SOLN: 5.0000 mg | Freq: Three times a day (TID) | INTRAMUSCULAR | Status: DC | PRN

## 2019-02-17 MED ORDER — METHOCARBAMOL 1000 MG/10ML IJ SOLN
500.0000 mg | Freq: Four times a day (QID) | INTRAVENOUS | Status: DC
  Filled 2019-02-17 (×20): qty 5

## 2019-02-17 MED ORDER — GABAPENTIN 300 MG PO CAPS
300.0000 mg | ORAL_CAPSULE | Freq: Three times a day (TID) | ORAL | Status: DC
  Administered 2019-02-17 – 2019-02-20 (×7): 300 mg via ORAL
  Filled 2019-02-17 (×7): qty 1

## 2019-02-17 MED ORDER — TRAMADOL HCL 50 MG PO TABS
50.0000 mg | ORAL_TABLET | Freq: Four times a day (QID) | ORAL | Status: DC
  Administered 2019-02-17 – 2019-02-20 (×9): 50 mg via ORAL
  Filled 2019-02-17 (×10): qty 1

## 2019-02-17 MED ORDER — HYDROMORPHONE HCL 1 MG/ML IJ SOLN
1.0000 mg | Freq: Once | INTRAMUSCULAR | Status: AC
  Administered 2019-02-17: 1 mg via INTRAVENOUS
  Filled 2019-02-17: qty 1

## 2019-02-17 MED ORDER — SODIUM CHLORIDE 0.9 % IV SOLN
INTRAVENOUS | Status: DC
  Administered 2019-02-17 – 2019-02-19 (×2): via INTRAVENOUS

## 2019-02-17 MED ORDER — ACETAMINOPHEN 325 MG PO TABS: 325.0000 mg | ORAL_TABLET | Freq: Four times a day (QID) | ORAL | Status: DC | PRN

## 2019-02-17 MED ORDER — DIPHENHYDRAMINE HCL 12.5 MG/5ML PO ELIX
12.5000 mg | ORAL_SOLUTION | ORAL | Status: DC | PRN
  Administered 2019-02-18: 25 mg via ORAL
  Filled 2019-02-17: qty 10

## 2019-02-17 MED ORDER — MORPHINE SULFATE (PF) 2 MG/ML IV SOLN
0.5000 mg | INTRAVENOUS | Status: DC | PRN
  Administered 2019-02-18: 1 mg via INTRAVENOUS
  Filled 2019-02-17: qty 1

## 2019-02-17 MED ORDER — SENNOSIDES-DOCUSATE SODIUM 8.6-50 MG PO TABS: 1.0000 | ORAL_TABLET | Freq: Every evening | ORAL | Status: DC | PRN

## 2019-02-17 MED ORDER — METOCLOPRAMIDE HCL 10 MG PO TABS
5.0000 mg | ORAL_TABLET | Freq: Three times a day (TID) | ORAL | Status: DC | PRN
  Administered 2019-02-18: 22:00:00 10 mg via ORAL
  Filled 2019-02-17: qty 1

## 2019-02-17 MED ORDER — HEPARIN SODIUM (PORCINE) 5000 UNIT/ML IJ SOLN
5000.0000 [IU] | Freq: Three times a day (TID) | INTRAMUSCULAR | Status: AC
  Administered 2019-02-17: 5000 [IU] via SUBCUTANEOUS
  Filled 2019-02-17: qty 1

## 2019-02-17 MED ORDER — METHOCARBAMOL 1000 MG/10ML IJ SOLN
INTRAMUSCULAR | Status: AC
  Filled 2019-02-17: qty 10

## 2019-02-17 MED ORDER — SODIUM CHLORIDE 0.9% IV SOLUTION: Freq: Once | INTRAVENOUS | Status: DC

## 2019-02-17 MED ORDER — CHLORHEXIDINE GLUCONATE 4 % EX LIQD
60.0000 mL | Freq: Once | CUTANEOUS | Status: AC
  Administered 2019-02-18: 4 via TOPICAL
  Filled 2019-02-17: qty 60

## 2019-02-17 MED ORDER — ONDANSETRON HCL 4 MG/2ML IJ SOLN: 4.0000 mg | Freq: Four times a day (QID) | INTRAMUSCULAR | Status: DC | PRN

## 2019-02-17 MED ORDER — CHLORHEXIDINE GLUCONATE 4 % EX LIQD: 60.0000 mL | Freq: Once | CUTANEOUS | Status: DC

## 2019-02-17 MED ORDER — ONDANSETRON HCL 4 MG PO TABS: 4.0000 mg | ORAL_TABLET | Freq: Four times a day (QID) | ORAL | Status: DC | PRN

## 2019-02-17 MED ORDER — METHOCARBAMOL 500 MG PO TABS
500.0000 mg | ORAL_TABLET | Freq: Four times a day (QID) | ORAL | Status: DC
  Administered 2019-02-17 – 2019-02-20 (×7): 500 mg via ORAL
  Filled 2019-02-17 (×8): qty 1

## 2019-02-17 MED ORDER — ACETAMINOPHEN 500 MG PO TABS
500.0000 mg | ORAL_TABLET | Freq: Four times a day (QID) | ORAL | Status: DC
  Administered 2019-02-17 – 2019-02-18 (×3): 500 mg via ORAL
  Filled 2019-02-17 (×4): qty 1

## 2019-02-17 MED ORDER — HYDROMORPHONE HCL 1 MG/ML IJ SOLN
1.0000 mg | INTRAMUSCULAR | Status: AC | PRN
  Administered 2019-02-17 (×2): 1 mg via INTRAVENOUS
  Filled 2019-02-17 (×2): qty 1

## 2019-02-17 MED ORDER — DOCUSATE SODIUM 100 MG PO CAPS
100.0000 mg | ORAL_CAPSULE | Freq: Two times a day (BID) | ORAL | Status: DC
  Administered 2019-02-17: 21:00:00 100 mg via ORAL
  Filled 2019-02-17: qty 1

## 2019-02-17 MED ORDER — HYDROCODONE-ACETAMINOPHEN 7.5-325 MG PO TABS
1.0000 | ORAL_TABLET | ORAL | Status: DC | PRN
  Administered 2019-02-18 – 2019-02-19 (×3): 2 via ORAL
  Administered 2019-02-20: 1 via ORAL
  Filled 2019-02-17 (×2): qty 2
  Filled 2019-02-17: qty 1
  Filled 2019-02-17: qty 2

## 2019-02-17 NOTE — ED Provider Notes (Signed)
[facility] EMERGENCY DEPARTMENT Provider Note   CSN: [id number] Arrival date & time: 02/17/19  [id number]     History   Chief Complaint Chief Complaint  Patient presents with  . Leg Pain    HPI Molly MenghiniKeona S Graves is a 28 y.o. female.     HPI  This patient is a 28 year old female, she denies any prior surgical history, struggles with depression but not taking any daily medicines.  She does drink heavily last night, this morning she was stumbling lost her balance and fell into the coffee table with her left thigh.  This broke the table but also caused her to have severe pain and deformity and the inability to stand on her thigh.  This was acute in onset, persistent, worse with trying to move it or touch it, not associated with any bleeding or wounds.  She was brought to the hospital by family members.  Past Medical History:  Diagnosis Date  . Anemia   . Depression    post partum depression  . Headache    MIGRAINES  . Herpes genitalia     Patient Active Problem List   Diagnosis Date Noted  . Indication for care in labor and delivery, antepartum 12/25/2018  . Normal vaginal delivery 12/25/2018  . Postpartum care following vaginal delivery 12/25/2018  . Dysuria 12/13/2018  . Back pain affecting pregnancy in third trimester 11/17/2018  . MDD (major depressive disorder) 10/10/2018  . Severe recurrent major depression without psychotic features (HCC) 10/10/2018  . 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 Iron deficiency anemia 09/03/2018  . Pyelonephritis affecting pregnancy in second trimester 08/08/2018  . Supervision of other normal pregnancy, antepartum 05/22/2018  . Complication of intrauterine device (IUD) (HCC) 04/19/2016    Past Surgical History:  Procedure Laterality Date  . BREAST SURGERY     lumpectomy left breast  . DIAGNOSTIC LAPAROSCOPY  2015   to rule out uterine injury after D&E  . HYSTEROSCOPY W/D&C N/A 04/19/2016   Procedure: DILATATION  AND CURETTAGE /HYSTEROSCOPY;  Surgeon: Nadara Mustardobert P Harris, MD;  Location: [facility] ORS;  Service: Gynecology;  Laterality: N/A;  . INDUCED ABORTION  2015  . IUD REMOVAL N/A 04/19/2016   Procedure: INTRAUTERINE DEVICE (IUD) REMOVAL;  Surgeon: Nadara Mustardobert P Harris, MD;  Location: [facility] ORS;  Service: Gynecology;  Laterality: N/A;  . LAPAROSCOPY N/A 04/19/2016   Procedure: LAPAROSCOPY DIAGNOSTIC;  Surgeon: Nadara Mustardobert P Harris, MD;  Location: [facility] ORS;  Service: Gynecology;  Laterality: N/A;     OB History    Gravida  5   Para  3   Term  3   Preterm  0   AB  2   Living  3     SAB  1   TAB  1   Ectopic  0   Multiple  0   Live Births  3            Home Medications    Prior to Admission medications   Medication Sig Start Date End Date Taking? Authorizing Provider  FLUoxetine (PROZAC) 20 MG capsule Take 1 capsule (20 mg total) by mouth daily. Patient not taking: Reported on 12/08/2018 10/12/18   Clapacs, Jackquline DenmarkJohn T, MD  Prenat-Fe Poly-Methfol-FA-DHA (VITAFOL FE+) 90-0.6-0.4-200 MG CAPS Take 1 tablet by mouth daily. 08/26/18   Natale MilchSchuman, Christanna R, MD    Family History Family History  Problem Relation Age of Onset  . Hypertension Maternal Aunt   . Hypertension Maternal Grandmother   . Cancer  Other 29       Cervical, Malignant  . Depression Mother   . Hypertension Mother   . Cancer Maternal Uncle   . Cancer Paternal Aunt     Social History Social History   Tobacco Use  . Smoking status: Former Smoker    Years: 7.00    Types: Cigars    Quit date: 04/17/2018    Years since quitting: 0.8  . Smokeless tobacco: Never Used  Substance Use Topics  . Alcohol use: Not Currently  . Drug use: No     Allergies   Patient has no known allergies.   Review of Systems Review of Systems  All other systems reviewed and are negative.    Physical Exam Updated Vital Signs BP (!) 103/58   Pulse 75   Temp 98.4 F (36.9 C) (Oral)   Resp 18   Ht 1.626 m (5\' 4" )   Wt 77.1 kg   LMP  02/07/2019   SpO2 97%   BMI 29.18 kg/m   Physical Exam Vitals signs and nursing note reviewed.  Constitutional:      General: She is in acute distress.     Appearance: She is well-developed.  HENT:     Head: Normocephalic and atraumatic.     Mouth/Throat:     Pharynx: No oropharyngeal exudate.  Eyes:     General: No scleral icterus.       Right eye: No discharge.        Left eye: No discharge.     Conjunctiva/sclera: Conjunctivae normal.     Pupils: Pupils are equal, round, and reactive to light.  Neck:     Musculoskeletal: Normal range of motion and neck supple.     Thyroid: No thyromegaly.     Vascular: No JVD.  Cardiovascular:     Rate and Rhythm: Normal rate and regular rhythm.     Heart sounds: Normal heart sounds. No murmur. No friction rub. No gallop.      Comments: Normal pulses at the feet bilaterally Pulmonary:     Effort: Pulmonary effort is normal. No respiratory distress.     Breath sounds: Normal breath sounds. No wheezing or rales.  Abdominal:     General: Bowel sounds are normal. There is no distension.     Palpations: Abdomen is soft. There is no mass.     Tenderness: There is no abdominal tenderness.  Musculoskeletal: Normal range of motion.        General: Swelling, tenderness and deformity present.     Comments: Left thigh is tender, swollen, appears to have some deformity  Lymphadenopathy:     Cervical: No cervical adenopathy.  Skin:    General: Skin is warm and dry.     Coloration: Skin is not jaundiced.     Findings: No bruising, erythema, lesion or rash.  Neurological:     Mental Status: She is alert.     Coordination: Coordination normal.     Comments: The patient has normal sensation to the feet bilaterally.  She is unable to move the left lower extremity secondary to his severe pain in the thigh  Psychiatric:        Behavior: Behavior normal.      ED Treatments / Results  Labs (all labs ordered are listed, but only abnormal results  are displayed) Labs Reviewed  CBC - Abnormal; Notable for the following components:      Result Value   WBC 13.2 (*)    All other  components within normal limits  BASIC METABOLIC PANEL - Abnormal; Notable for the following components:   CO2 20 (*)    Glucose, Bld 106 (*)    All other components within normal limits  SARS CORONAVIRUS 2 (HOSPITAL ORDER, PERFORMED IN [facility])  ETHANOL  RAPID URINE DRUG SCREEN, HOSP PERFORMED  I-STAT BETA HCG BLOOD, ED ([facility], [facility], [facility] ONLY)  TYPE AND SCREEN    EKG EKG Interpretation  Date/Time:  Wednesday February 17 2019 10:40:43 EDT Ventricular Rate:  67 PR Interval:    QRS Duration: 84 QT Interval:  393 QTC Calculation: 415 R Axis:   85 Text Interpretation:  Sinus rhythm ST elevation, diffuse - likely early repolarization No old tracing to compare Confirmed by Eber HongMiller, Zendayah Hardgrave ([id number]) on 02/17/2019 11:11:40 AM   Radiology Dg Pelvis 1-2 Views  Result Date: 02/17/2019 CLINICAL DATA:  Severe upper left leg pain following a fall this morning. EXAM: PELVIS - 1-2 VIEW COMPARISON:  Left femur obtained at the same time. FINDINGS: The patient is rotated to the left. Grossly normal appearing bones and soft tissues with no fracture or dislocation seen. IMPRESSION: Limited examination due to patient rotation to the left with no visible fracture or dislocation. Electronically Signed   By: Beckie SaltsSteven  Reid M.D.   On: 02/17/2019 12:16   Dg Femur Min 2 Views Left  Result Date: 02/17/2019 CLINICAL DATA:  Severe left upper leg pain following a fall onto a table this morning. EXAM: LEFT FEMUR 2 VIEWS COMPARISON:  None. FINDINGS: Mid femoral shaft fracture with slightly greater than 1 shaft width of medial displacement of the distal fragment as well as 4.5 cm of overlapping of the fragments. There is also medial and anterior angulation of the distal fragment. IMPRESSION: Mid femoral shaft fracture, as described above. Electronically Signed   By: Beckie SaltsSteven  Reid M.D.    On: 02/17/2019 12:15    Procedures Procedures (including critical care time)  Medications Ordered in ED Medications  HYDROmorphone (DILAUDID) injection 1 mg (1 mg Intravenous Given 02/17/19 1016)     Initial Impression / Assessment and Plan / ED Course  I have reviewed the triage vital signs and the nursing notes.  Pertinent labs & imaging results that were available during my care of the patient were reviewed by me and considered in my medical decision making (see chart for details).  Clinical Course as of Feb 17 1240  Wed Feb 17, 2019  1211 X-ray reveals mid femur fracture.  Transverse, there is displacement approximately 1 shaft and some angulation, orthopedics paged at 12:15 PM   [BM]    Clinical Course User Index [BM] Eber HongMiller, Katalyna Socarras, MD       I suspect the patient has a fracture.  I placed an IV in the patient's hand for pain medication.  I was unable to draw blood but I did flush well.  X-rays will be obtained, preop labs, anticipate fracture of the femur  No SOB or resp symptoms - normal CV and pul exam  covid neg Labs unremarkable Xray shows mid femur fracture D/w dr. Romeo AppleHarrison who will consult / admit  Molly MenghiniKeona S Brockbank was evaluated in Emergency Department on 02/17/2019 for the symptoms described in the history of present illness. She was evaluated in the context of the global COVID-19 pandemic, which necessitated consideration that the patient might be at risk for infection with the SARS-CoV-2 virus that causes COVID-19. Institutional protocols and algorithms that pertain to the evaluation of patients at risk for  COVID-19 are in a state of rapid change based on information released by regulatory bodies including the CDC and federal and state organizations. These policies and algorithms were followed during the patient's care in the ED.   Final Clinical Impressions(s) / ED Diagnoses   Final diagnoses:  Closed fracture of left femur, unspecified fracture morphology,  unspecified portion of femur, initial encounter (HCC)      Eber Hong, MD 02/17/19 1241

## 2019-02-17 NOTE — ED Triage Notes (Signed)
Pt has been drinking throughout the night and stumbled this morning and fell onto a table. Slight swelling to left femur. Pt is not able to move left leg at all.

## 2019-02-17 NOTE — H&P (Signed)
Molly Graves is an 28 y.o. female.   Chief Complaint: Pain left thigh HPI: 28 year old female drank too much became confused lost her balance tripped over a table fractured her left femur on 1 September was brought in by her family this morning found to have a left femoral shaft fracture with 100% displacement.  Complained of severe nonradiating sharp pain left thigh with deformity and inability to walk  She is postpartum by 1 to 2 months  She has a history of anemia.  She says it is not sickle cell just low iron Past Medical History:  Diagnosis Date  . Anemia   . Depression    post partum depression  . Headache    MIGRAINES  . Herpes genitalia     Past Surgical History:  Procedure Laterality Date  . BREAST SURGERY     lumpectomy left breast  . DIAGNOSTIC LAPAROSCOPY  2015   to rule out uterine injury after D&E  . HYSTEROSCOPY W/D&C N/A 04/19/2016   Procedure: DILATATION AND CURETTAGE /HYSTEROSCOPY;  Surgeon: Gae Dry, MD;  Location: [facility] ORS;  Service: Gynecology;  Laterality: N/A;  . INDUCED ABORTION  2015  . IUD REMOVAL N/A 04/19/2016   Procedure: INTRAUTERINE DEVICE (IUD) REMOVAL;  Surgeon: Gae Dry, MD;  Location: [facility] ORS;  Service: Gynecology;  Laterality: N/A;  . LAPAROSCOPY N/A 04/19/2016   Procedure: LAPAROSCOPY DIAGNOSTIC;  Surgeon: Gae Dry, MD;  Location: [facility] ORS;  Service: Gynecology;  Laterality: N/A;    Family History  Problem Relation Age of Onset  . Hypertension Maternal Aunt   . Hypertension Maternal Grandmother   . Cancer Other 29       Cervical, Malignant  . Depression Mother   . Hypertension Mother   . Cancer Maternal Uncle   . Cancer Paternal Aunt    Social History:  reports that she quit smoking about 10 months ago. Her smoking use included cigars. She quit after 7.00 years of use. She has never used smokeless tobacco. She reports previous alcohol use. She reports that she does not use drugs.  Allergies: No Known  Allergies  No medications prior to admission.    Results for orders placed or performed during the hospital encounter of 02/17/19 (from the past 48 hour(s))  SARS Coronavirus 2 [facility] order, Performed in [facility] hospital lab) Nasopharyngeal Nasopharyngeal Swab     Status: None   Collection Time: 02/17/19 10:08 AM   Specimen: Nasopharyngeal Swab  Result Value Ref Range   SARS Coronavirus 2 NEGATIVE NEGATIVE    Comment: (NOTE) If result is NEGATIVE SARS-CoV-2 target nucleic acids are NOT DETECTED. The SARS-CoV-2 RNA is generally detectable in upper and lower  respiratory specimens during the acute phase of infection. The lowest  concentration of SARS-CoV-2 viral copies this assay can detect is 250  copies / mL. A negative result does not preclude SARS-CoV-2 infection  and should not be used as the sole basis for treatment or other  patient management decisions.  A negative result may occur with  improper specimen collection / handling, submission of specimen other  than nasopharyngeal swab, presence of viral mutation(s) within the  areas targeted by this assay, and inadequate number of viral copies  (<250 copies / mL). A negative result must be combined with clinical  observations, patient history, and epidemiological information. If result is POSITIVE SARS-CoV-2 target nucleic acids are DETECTED. The SARS-CoV-2 RNA is generally detectable in upper and lower  respiratory specimens dur ing the acute  phase of infection.  Positive  results are indicative of active infection with SARS-CoV-2.  Clinical  correlation with patient history and other diagnostic information is  necessary to determine patient infection status.  Positive results do  not rule out bacterial infection or co-infection with other viruses. If result is PRESUMPTIVE POSTIVE SARS-CoV-2 nucleic acids MAY BE PRESENT.   A presumptive positive result was obtained on the submitted specimen  and confirmed on repeat  testing.  While 2019 novel coronavirus  (SARS-CoV-2) nucleic acids may be present in the submitted sample  additional confirmatory testing may be necessary for epidemiological  and / or clinical management purposes  to differentiate between  SARS-CoV-2 and other Sarbecovirus currently known to infect humans.  If clinically indicated additional testing with an alternate test  methodology [phone]([id number]) is advised. The SARS-CoV-2 RNA is generally  detectable in upper and lower respiratory sp ecimens during the acute  phase of infection. The expected result is Negative. Fact Sheet for Patients:  BoilerBrush.com.cyhttps://www.fda.gov/media/136312/download Fact Sheet for Healthcare Providers: https://pope.com/https://www.fda.gov/media/136313/download This test is not yet approved or cleared by the [place] FDA and has been authorized for detection and/or diagnosis of SARS-CoV-2 by FDA under an Emergency Use Authorization (EUA).  This EUA will remain in effect (meaning this test can be used) for the duration of the COVID-19 declaration under Section 564(b)(1) of the Act, 21 U.S.C. section 360bbb-3(b)(1), unless the authorization is terminated or revoked sooner. Performed at [facility], [address] [place], [place] [address]   CBC     Status: Abnormal   Collection Time: 02/17/19 11:05 AM  Result Value Ref Range   WBC 13.2 (H) 4.0 - 10.5 K/uL   RBC 4.76 3.87 - 5.11 MIL/uL   Hemoglobin 14.0 12.0 - 15.0 g/dL   HCT 81.142.7 91.436.0 - 78.246.0 %   MCV 89.7 80.0 - 100.0 fL   MCH 29.4 26.0 - 34.0 pg   MCHC 32.8 30.0 - 36.0 g/dL   RDW 95.614.1 21.311.5 - 08.615.5 %   Platelets 279 150 - 400 K/uL   nRBC 0.0 0.0 - 0.2 %    Comment: Performed at [facility], [address] [place], [place] [address]  Basic metabolic panel     Status: Abnormal   Collection Time: 02/17/19 11:05 AM  Result Value Ref Range   Sodium 138 135 - 145 mmol/L   Potassium 3.7 3.5 - 5.1 mmol/L   Chloride 107 98 - 111 mmol/L   CO2 20 (L) 22 - 32 mmol/L   Glucose, Bld 106  (H) 70 - 99 mg/dL   BUN 8 6 - 20 mg/dL   Creatinine, Ser 9.620.82 0.44 - 1.00 mg/dL   Calcium 9.1 8.9 - 95.210.3 mg/dL   GFR calc non Af Amer >60 >60 mL/min   GFR calc Af Amer >60 >60 mL/min   Anion gap 11 5 - 15    Comment: Performed at [facility], [address] [place], [place] [address]  I-Stat Beta hCG blood, ED ([facility], [facility], [facility] only)     Status: None   Collection Time: 02/17/19 11:10 AM  Result Value Ref Range   I-stat hCG, quantitative <5.0 <5 mIU/mL   Comment 3            Comment:   GEST. AGE      CONC.  (mIU/mL)   <=1 WEEK        5 - 50     2 WEEKS       50 - 500  3 WEEKS       100 - 10,000     4 WEEKS     1,000 - 30,000        FEMALE AND NON-PREGNANT FEMALE:     LESS THAN 5 mIU/mL   Ethanol     Status: None   Collection Time: 02/17/19 11:11 AM  Result Value Ref Range   Alcohol, Ethyl (B) <10 <10 mg/dL    Comment: (NOTE) Lowest detectable limit for serum alcohol is 10 mg/dL. For medical purposes only. Performed at [facility], [address], [place], [place] [address]   Type and screen     Status: None   Collection Time: 02/17/19 11:12 AM  Result Value Ref Range   ABO/RH(D) O POS    Antibody Screen NEG    Sample Expiration      02/20/2019,2359 Performed at [facility], [address] [place], [place] [address]    Dg Pelvis 1-2 Views  Result Date: 02/17/2019 CLINICAL DATA:  Severe upper left leg pain following a fall this morning. EXAM: PELVIS - 1-2 VIEW COMPARISON:  Left femur obtained at the same time. FINDINGS: The patient is rotated to the left. Grossly normal appearing bones and soft tissues with no fracture or dislocation seen. IMPRESSION: Limited examination due to patient rotation to the left with no visible fracture or dislocation. Electronically Signed   By: Beckie Salts M.D.   On: 02/17/2019 12:16   Dg Femur Min 2 Views Left  Result Date: 02/17/2019 CLINICAL DATA:  Severe left upper leg pain following a fall onto a table this morning. EXAM: LEFT FEMUR 2 VIEWS  COMPARISON:  None. FINDINGS: Mid femoral shaft fracture with slightly greater than 1 shaft width of medial displacement of the distal fragment as well as 4.5 cm of overlapping of the fragments. There is also medial and anterior angulation of the distal fragment. IMPRESSION: Mid femoral shaft fracture, as described above. Electronically Signed   By: Beckie Salts M.D.   On: 02/17/2019 12:15    Review of Systems  All other systems reviewed and are negative.   Blood pressure (!) 94/59, pulse 67, temperature 98.4 F (36.9 C), temperature source Oral, resp. rate 15, height 5\' 4"  (1.626 m), weight 77.1 kg, last menstrual period 02/07/2019, SpO2 99 %, unknown if currently breastfeeding. Physical Exam   She is awake alert and oriented x3 looks comfortable lying on her side  Appearance is normal  Mood and affect are normal  Gait cannot walk  Vitals are stable blood pressure 94/60 pulse normal  Right and left upper extremity Skin normal, alignment normal no tenderness, range of motion normal, no ligamentous instability.  Muscle tone and strength normal no tremor  Right lower extremity skin normal alignment normal no tenderness range of motion normal no ligamentous instability muscle tone and strength normal no tremor  Left lower extremity tenderness over the mid to lower femur and thigh with minimal deformity.  Skin normal.  Range of motion ligamentous stability not tested.  Fracture.  Muscle tone normal no tremor  Lymph nodes are negative in the groin  Sensation is normal in all 4 extremities  Distal pulses are intact pulses are bounding both lower extremities no edema  Coordination and balance deferred  Pathologic reflexes none toes are downgoing bilaterally  Deep tendon reflexes upper extremity normal Assessment/Plan 28 year old female midshaft to lower third middle fracture of the left femur  History of anemia currently hemoglobin 14  Recommend intramedullary nailing left  femur  Type and cross 2 units  The procedure has been fully reviewed with the patient; The risks and benefits of surgery have been discussed and explained and understood. Alternative treatment has also been reviewed, questions were encouraged and answered. The postoperative plan is also been reviewed.   Fuller CanadaStanley Harrison, MD 02/17/2019, 5:18 PM

## 2019-02-18 ENCOUNTER — Encounter (HOSPITAL_COMMUNITY)
Admission: EM | Disposition: A | Discharge status: Home Health | Payer: Self-pay | Source: Home / Self Care | Attending: Orthopedic Surgery

## 2019-02-18 ENCOUNTER — Encounter (HOSPITAL_COMMUNITY): Payer: Self-pay | Admitting: Anesthesiology

## 2019-02-18 ENCOUNTER — Inpatient Hospital Stay (HOSPITAL_COMMUNITY): Payer: Medicaid Other | Admitting: Anesthesiology

## 2019-02-18 ENCOUNTER — Inpatient Hospital Stay (HOSPITAL_COMMUNITY): Payer: Medicaid Other

## 2019-02-18 DIAGNOSIS — S7292XA Unspecified fracture of left femur, initial encounter for closed fracture: Secondary | ICD-10-CM

## 2019-02-18 HISTORY — PX: FEMUR IM NAIL: SHX1597

## 2019-02-18 LAB — CBC
HCT: 39.7 % (ref 36.0–46.0)
Hemoglobin: 12.3 g/dL (ref 12.0–15.0)
MCH: 28.7 pg (ref 26.0–34.0)
MCHC: 31 g/dL (ref 30.0–36.0)
MCV: 92.8 fL (ref 80.0–100.0)
Platelets: 244 10*3/uL (ref 150–400)
RBC: 4.28 MIL/uL (ref 3.87–5.11)
RDW: 14.3 % (ref 11.5–15.5)
WBC: 11.7 10*3/uL — ABNORMAL HIGH (ref 4.0–10.5)
nRBC: 0 % (ref 0.0–0.2)

## 2019-02-18 LAB — CREATININE, SERUM
Creatinine, Ser: 0.8 mg/dL (ref 0.44–1.00)
GFR calc Af Amer: >60 mL/min (ref >60)
GFR calc non Af Amer: >60 mL/min (ref >60)

## 2019-02-18 SURGERY — INSERTION, INTRAMEDULLARY ROD, FEMUR
Anesthesia: General | Site: Thigh | Laterality: Left

## 2019-02-18 MED ORDER — MENTHOL 3 MG MT LOZG: 1.0000 | LOZENGE | OROMUCOSAL | Status: DC | PRN

## 2019-02-18 MED ORDER — METHOCARBAMOL 1000 MG/10ML IJ SOLN
500.0000 mg | Freq: Once | INTRAVENOUS | Status: AC
  Administered 2019-02-18: 14:00:00 500 mg via INTRAVENOUS
  Filled 2019-02-18: qty 500

## 2019-02-18 MED ORDER — ONDANSETRON HCL 4 MG PO TABS: 4.0000 mg | ORAL_TABLET | Freq: Four times a day (QID) | ORAL | Status: DC | PRN

## 2019-02-18 MED ORDER — MIDAZOLAM HCL 2 MG/2ML IJ SOLN
INTRAMUSCULAR | Status: AC
  Filled 2019-02-18: qty 2

## 2019-02-18 MED ORDER — SEVOFLURANE IN SOLN
RESPIRATORY_TRACT | Status: AC
  Filled 2019-02-18: qty 250

## 2019-02-18 MED ORDER — BUPIVACAINE-EPINEPHRINE (PF) 0.5% -1:200000 IJ SOLN
INTRAMUSCULAR | Status: AC
  Filled 2019-02-18: qty 60

## 2019-02-18 MED ORDER — CEFAZOLIN SODIUM-DEXTROSE 2-4 GM/100ML-% IV SOLN
2.0000 g | INTRAVENOUS | Status: AC
  Administered 2019-02-18: 10:00:00 2 g via INTRAVENOUS

## 2019-02-18 MED ORDER — POVIDONE-IODINE 10 % EX SWAB: 2.0000 "application " | Freq: Once | CUTANEOUS | Status: DC

## 2019-02-18 MED ORDER — DOCUSATE SODIUM 100 MG PO CAPS
100.0000 mg | ORAL_CAPSULE | Freq: Two times a day (BID) | ORAL | Status: DC
  Administered 2019-02-18 – 2019-02-20 (×4): 100 mg via ORAL
  Filled 2019-02-18 (×4): qty 1

## 2019-02-18 MED ORDER — CEFAZOLIN SODIUM-DEXTROSE 2-4 GM/100ML-% IV SOLN
2.0000 g | Freq: Four times a day (QID) | INTRAVENOUS | Status: AC
  Administered 2019-02-18 (×2): 2 g via INTRAVENOUS
  Filled 2019-02-18 (×2): qty 100

## 2019-02-18 MED ORDER — PROPOFOL 10 MG/ML IV BOLUS
INTRAVENOUS | Status: AC
  Filled 2019-02-18: qty 20

## 2019-02-18 MED ORDER — PREGABALIN 50 MG PO CAPS
50.0000 mg | ORAL_CAPSULE | Freq: Once | ORAL | Status: AC
  Administered 2019-02-18: 14:00:00 50 mg via ORAL
  Filled 2019-02-18: qty 1

## 2019-02-18 MED ORDER — FENTANYL CITRATE (PF) 100 MCG/2ML IJ SOLN
INTRAMUSCULAR | Status: DC | PRN
  Administered 2019-02-18 (×8): 50 ug via INTRAVENOUS

## 2019-02-18 MED ORDER — SUGAMMADEX SODIUM 200 MG/2ML IV SOLN
INTRAVENOUS | Status: DC | PRN
  Administered 2019-02-18: 160 mg via INTRAVENOUS

## 2019-02-18 MED ORDER — FENTANYL CITRATE (PF) 250 MCG/5ML IJ SOLN
INTRAMUSCULAR | Status: AC
  Filled 2019-02-18: qty 5

## 2019-02-18 MED ORDER — 0.9 % SODIUM CHLORIDE (POUR BTL) OPTIME
TOPICAL | Status: DC | PRN
  Administered 2019-02-18 (×2): 1000 mL

## 2019-02-18 MED ORDER — PROPOFOL 10 MG/ML IV BOLUS
INTRAVENOUS | Status: DC | PRN
  Administered 2019-02-18: 200 mg via INTRAVENOUS

## 2019-02-18 MED ORDER — ROCURONIUM 10MG/ML (10ML) SYRINGE FOR MEDFUSION PUMP - OPTIME
INTRAVENOUS | Status: DC | PRN
  Administered 2019-02-18: 10 mg via INTRAVENOUS
  Administered 2019-02-18: 60 mg via INTRAVENOUS
  Administered 2019-02-18 (×2): 10 mg via INTRAVENOUS

## 2019-02-18 MED ORDER — MIDAZOLAM HCL 2 MG/2ML IJ SOLN: 0.5000 mg | Freq: Once | INTRAMUSCULAR | Status: DC | PRN

## 2019-02-18 MED ORDER — ENOXAPARIN SODIUM 30 MG/0.3ML ~~LOC~~ SOLN: 30.0000 mg | SUBCUTANEOUS | Status: DC

## 2019-02-18 MED ORDER — ENOXAPARIN SODIUM 40 MG/0.4ML ~~LOC~~ SOLN
40.0000 mg | SUBCUTANEOUS | Status: DC
  Administered 2019-02-19 – 2019-02-20 (×2): 40 mg via SUBCUTANEOUS
  Filled 2019-02-18 (×2): qty 0.4

## 2019-02-18 MED ORDER — ONDANSETRON HCL 4 MG/2ML IJ SOLN
4.0000 mg | Freq: Once | INTRAMUSCULAR | Status: DC
  Filled 2019-02-18: qty 2

## 2019-02-18 MED ORDER — MORPHINE SULFATE (PF) 4 MG/ML IV SOLN: 4.0000 mg | INTRAVENOUS | Status: DC | PRN

## 2019-02-18 MED ORDER — ROCURONIUM BROMIDE 10 MG/ML (PF) SYRINGE
PREFILLED_SYRINGE | INTRAVENOUS | Status: AC
  Filled 2019-02-18: qty 10

## 2019-02-18 MED ORDER — PROMETHAZINE HCL 25 MG/ML IJ SOLN: 6.2500 mg | INTRAMUSCULAR | Status: DC | PRN

## 2019-02-18 MED ORDER — OXYCODONE HCL 5 MG PO TABS
5.0000 mg | ORAL_TABLET | Freq: Once | ORAL | Status: AC
  Administered 2019-02-18: 5 mg via ORAL
  Filled 2019-02-18: qty 1

## 2019-02-18 MED ORDER — HYDROMORPHONE HCL 1 MG/ML IJ SOLN
0.2500 mg | INTRAMUSCULAR | Status: DC | PRN
  Administered 2019-02-18 (×2): 0.5 mg via INTRAVENOUS
  Filled 2019-02-18 (×2): qty 0.5

## 2019-02-18 MED ORDER — CEFAZOLIN SODIUM-DEXTROSE 2-4 GM/100ML-% IV SOLN
INTRAVENOUS | Status: AC
  Filled 2019-02-18: qty 100

## 2019-02-18 MED ORDER — ONDANSETRON HCL 4 MG/2ML IJ SOLN: 4.0000 mg | Freq: Four times a day (QID) | INTRAMUSCULAR | Status: DC | PRN

## 2019-02-18 MED ORDER — MIDAZOLAM HCL 5 MG/5ML IJ SOLN
INTRAMUSCULAR | Status: DC | PRN
  Administered 2019-02-18: 2 mg via INTRAVENOUS

## 2019-02-18 MED ORDER — PHENOL 1.4 % MT LIQD: 1.0000 | OROMUCOSAL | Status: DC | PRN

## 2019-02-18 MED ORDER — ONDANSETRON HCL 4 MG/2ML IJ SOLN
INTRAMUSCULAR | Status: DC | PRN
  Administered 2019-02-18: 4 mg via INTRAVENOUS

## 2019-02-18 MED ORDER — LACTATED RINGERS IV SOLN
INTRAVENOUS | Status: DC
  Administered 2019-02-18: 13:00:00 via INTRAVENOUS
  Administered 2019-02-18: 1000 mL via INTRAVENOUS

## 2019-02-18 MED ORDER — BUPIVACAINE-EPINEPHRINE 0.5% -1:200000 IJ SOLN
INTRAMUSCULAR | Status: DC | PRN
  Administered 2019-02-18: 60 mL

## 2019-02-18 MED ORDER — PHENYLEPHRINE 40 MCG/ML (10ML) SYRINGE FOR IV PUSH (FOR BLOOD PRESSURE SUPPORT)
PREFILLED_SYRINGE | INTRAVENOUS | Status: AC
  Filled 2019-02-18: qty 10

## 2019-02-18 MED ORDER — CELECOXIB 400 MG PO CAPS
400.0000 mg | ORAL_CAPSULE | Freq: Once | ORAL | Status: AC
  Administered 2019-02-18: 14:00:00 400 mg via ORAL
  Filled 2019-02-18: qty 1

## 2019-02-18 SURGICAL SUPPLY — 56 items
BIT DRILL CALIBRATED 4.2 (BIT) ×1 IMPLANT
BIT DRILL SHORT 4.2 (BIT) ×1 IMPLANT
BLADE 10 SAFETY STRL DISP (BLADE) ×6 IMPLANT
BNDG GAUZE ELAST 4 BULKY (GAUZE/BANDAGES/DRESSINGS) ×6 IMPLANT
CHLORAPREP W/TINT 26 (MISCELLANEOUS) ×3 IMPLANT
CLOTH BEACON ORANGE TIMEOUT ST (SAFETY) ×3 IMPLANT
COVER LIGHT HANDLE STERIS (MISCELLANEOUS) ×6 IMPLANT
COVER WAND RF STERILE (DRAPES) ×3 IMPLANT
DECANTER SPIKE VIAL GLASS SM (MISCELLANEOUS) ×6 IMPLANT
DRAPE STERI IOBAN 125X83 (DRAPES) ×3 IMPLANT
DRESSING MEPILEX BORDER 6X8 (GAUZE/BANDAGES/DRESSINGS) ×1 IMPLANT
DRILL BIT CALIBRATED 4.2 (BIT) ×3
DRILL BIT SHORT 4.2 (BIT) ×2
DRSG MEPILEX BORDER 4X12 (GAUZE/BANDAGES/DRESSINGS) ×3 IMPLANT
DRSG MEPILEX BORDER 4X8 (GAUZE/BANDAGES/DRESSINGS) ×6 IMPLANT
DRSG MEPILEX BORDER 6X8 (GAUZE/BANDAGES/DRESSINGS) ×3
GAUZE 4X4 16PLY RFD (DISPOSABLE) ×3 IMPLANT
GAUZE SPONGE 4X4 12PLY STRL (GAUZE/BANDAGES/DRESSINGS) IMPLANT
GAUZE XEROFORM 5X9 LF (GAUZE/BANDAGES/DRESSINGS) IMPLANT
GLOVE BIO SURGEON STRL SZ7 (GLOVE) ×6 IMPLANT
GLOVE BIOGEL PI IND STRL 7.0 (GLOVE) ×2 IMPLANT
GLOVE BIOGEL PI INDICATOR 7.0 (GLOVE) ×4
GLOVE SKINSENSE NS SZ8.0 LF (GLOVE) ×2
GLOVE SKINSENSE STRL SZ8.0 LF (GLOVE) ×1 IMPLANT
GLOVE SS N UNI LF 8.5 STRL (GLOVE) ×3 IMPLANT
GOWN STRL REUS W/TWL LRG LVL3 (GOWN DISPOSABLE) ×6 IMPLANT
GOWN STRL REUS W/TWL XL LVL3 (GOWN DISPOSABLE) ×3 IMPLANT
GUIDEWIRE 3.2X400 (WIRE) ×3 IMPLANT
INST SET MAJOR BONE (KITS) ×3 IMPLANT
KIT TURNOVER KIT A (KITS) ×3 IMPLANT
MANIFOLD NEPTUNE II (INSTRUMENTS) ×3 IMPLANT
MARKER SKIN DUAL TIP RULER LAB (MISCELLANEOUS) ×3 IMPLANT
NAIL CANN FRN 10X380 LT (Nail) ×3 IMPLANT
NS IRRIG 1000ML POUR BTL (IV SOLUTION) ×6 IMPLANT
PACK BASIC III (CUSTOM PROCEDURE TRAY) ×2
PACK SRG BSC III STRL LF ECLPS (CUSTOM PROCEDURE TRAY) ×1 IMPLANT
PAD ABD 5X9 TENDERSORB (GAUZE/BANDAGES/DRESSINGS) ×3 IMPLANT
PAD ARMBOARD 7.5X6 YLW CONV (MISCELLANEOUS) ×3 IMPLANT
REAMER ROD DEEP FLUTE 2.5X950 (INSTRUMENTS) ×3 IMPLANT
SCREW CANN LOCK FT STRDR 5X70 (Screw) ×3 IMPLANT
SCREW LOCK T25 FT 36X5X4.3X (Screw) ×3 IMPLANT
SCREW LOCKING 5.0X36MM (Screw) ×6 IMPLANT
SET BASIN LINEN APH (SET/KITS/TRAYS/PACK) ×3 IMPLANT
SPONGE LAP 18X18 RF (DISPOSABLE) ×9 IMPLANT
STAPLER VISISTAT 35W (STAPLE) ×3 IMPLANT
SUT BRALON NAB BRD #1 30IN (SUTURE) ×3 IMPLANT
SUT MNCRL 0 VIOLET CTX 36 (SUTURE) ×1 IMPLANT
SUT MON AB 0 CT1 (SUTURE) ×6 IMPLANT
SUT MON AB 2-0 SH 27 (SUTURE) ×2
SUT MON AB 2-0 SH27 (SUTURE) ×1 IMPLANT
SUT MONOCRYL 0 CTX 36 (SUTURE) ×2
SUT VIC AB 1 CT1 27 (SUTURE) ×2
SUT VIC AB 1 CT1 27XBRD ANTBC (SUTURE) ×1 IMPLANT
SYR 30ML LL (SYRINGE) ×6 IMPLANT
SYR BULB IRRIGATION 50ML (SYRINGE) ×6 IMPLANT
TAPE MEDIFIX FOAM 3 (GAUZE/BANDAGES/DRESSINGS) IMPLANT

## 2019-02-18 NOTE — Anesthesia Preprocedure Evaluation (Signed)
Anesthesia Evaluation  Patient identified by MRN, date of birth, ID band Patient awake    Reviewed: Allergy & Precautions, NPO status , Patient's Chart, lab work & pertinent test results  Airway Mallampati: II  TM Distance: >3 FB Neck ROM: Full    Dental no notable dental hx. (+) Teeth Intact   Pulmonary Current SmokerPatient did not abstain from smoking., former smoker,    Pulmonary exam normal breath sounds clear to auscultation       Cardiovascular Exercise Tolerance: Good negative cardio ROS Normal cardiovascular examI Rhythm:Regular Rate:Normal     Neuro/Psych  Headaches, PSYCHIATRIC DISORDERS Depression States off meds -ran out   GI/Hepatic negative GI ROS, Neg liver ROS,   Endo/Other  negative endocrine ROS  Renal/GU negative Renal ROS  negative genitourinary   Musculoskeletal negative musculoskeletal ROS (+)   Abdominal   Peds negative pediatric ROS (+)  Hematology negative hematology ROS (+)   Anesthesia Other Findings Fell yesterday, states drunk, denies LOC or Neck pain  Reproductive/Obstetrics negative OB ROS                             Anesthesia Physical Anesthesia Plan  ASA: II  Anesthesia Plan: General   Post-op Pain Management:    Induction: Intravenous  PONV Risk Score and Plan: 2 and Ondansetron, Treatment may vary due to age or medical condition and Midazolam  Airway Management Planned: Oral ETT  Additional Equipment:   Intra-op Plan:   Post-operative Plan: Extubation in OR  Informed Consent: I have reviewed the patients History and Physical, chart, labs and discussed the procedure including the risks, benefits and alternatives for the proposed anesthesia with the patient or authorized representative who has indicated his/her understanding and acceptance.     Dental advisory given  Plan Discussed with: CRNA  Anesthesia Plan Comments: (Plan Full PPE  use Plan GETA D/W PT -WTP with same after Q&A)        Anesthesia Quick Evaluation

## 2019-02-18 NOTE — Interval H&P Note (Signed)
History and Physical Interval Note:  02/18/2019 10:24 AM  Molly Graves  has presented today for surgery, with the diagnosis of left femur fracture.  The various methods of treatment have been discussed with the patient and family. After consideration of risks, benefits and other options for treatment, the patient has consented to  Procedure(s): INTRAMEDULLARY (IM) NAIL FEMORAL (Left) as a surgical intervention.  The patient's history has been reviewed, patient examined, no change in status, stable for surgery.  I have reviewed the patient's chart and labs.  Questions were answered to the patient's satisfaction.     Arther Abbott

## 2019-02-18 NOTE — Anesthesia Postprocedure Evaluation (Signed)
Anesthesia Post Note  Patient: Molly Graves  Procedure(s) Performed: INTRAMEDULLARY (IM) NAIL FEMORAL (Left Thigh)  Patient location during evaluation: PACU Anesthesia Type: General Level of consciousness: awake and alert and oriented Pain management: pain level controlled Vital Signs Assessment: post-procedure vital signs reviewed and stable Respiratory status: spontaneous breathing Cardiovascular status: blood pressure returned to baseline and stable Postop Assessment: no apparent nausea or vomiting Anesthetic complications: no     Last Vitals:  Vitals:   02/18/19 1415 02/18/19 1430  BP: (!) 115/51 (!) 108/51  Pulse: (!) 51 (!) 51  Resp: 15 13  Temp:    SpO2: 95% 97%    Last Pain:  Vitals:   02/18/19 1430  TempSrc:   PainSc: Asleep    LLE Motor Response: Responds to commands;Purposeful movement (02/18/19 1430) LLE Sensation: No tingling;No numbness;Full sensation (02/18/19 1430)          Milagros Middendorf

## 2019-02-18 NOTE — Op Note (Signed)
02/18/2019  1:36 PM  PATIENT:  Molly Graves  28 y.o. female  PRE-OPERATIVE DIAGNOSIS:  left femur fracture  POST-OPERATIVE DIAGNOSIS:  left femur fracture  Surgical findings oblique fracture just distal to the midshaft of the femur  PROCEDURE:  Procedure(s): INTRAMEDULLARY (IM) NAIL FEMORAL (Left) 16967  Implant: Greater trochanteric entry Synthes nail which was 10 x  380 mm with proximal and distal locking to proximal locks and 2 distal locks   SURGEON:  Surgeon(s) and Role:    Carole Civil, MD - Primary  Procedure was done as follows.  Patient was seen in preop site confirmed marked chart reviewed update placed in the chart  Patient went to surgery for general anesthesia and was placed on the fracture table.  The patient was placed in supine position with the right leg flexed at the hip and knee and placed in a well leg holder with padding  Perineal post was used  The leg was placed in traction and the C-arm was brought in and proximal anterior to posterior pressure and distal posterior to anterior pressure with medial lateral manipulation of the 2 fragments by hand to reduce the fracture.  It was easily brought out to length with traction.  Leg was then prepped and draped sterilely.  From iliac crest to just below the knee  After timeout and confirmation of antibiotics and implants placed an incision over the trochanter extended it proximally divided subcutaneous tissue which was rather thick getting down to the fascia which was split and then palpated the greater trochanter.  It took several passes of the guidewire including the multi pin tract guide to get the trochanteric entry point.  This was confirmed by x-ray.  We then passed a 14 mm proximal reamer down to the lesser trochanter and then passed the guidewire manipulated the fracture as we did preoperatively and passed a guidewire down to the knee which was confirmed by x-ray.  We passed reamers up to a size 11.5  where we got excellent chatter and tried to pass the 10 nail it did not pass easily I took it out and reamed with a 12 reamer  I then passed the nail much easier with an excellent tight fit manipulating the fracture as it passed over the fracture site down to the suprapatellar region.  After confirming reduction in nail position proximally and distally we placed 2 proximal locking screws 1 oblique 1 transverse using the guides and the proximal locking jig.Marland Kitchen  1 of the locking screws went through the proximal incision and a second incision proximally was made to get the second locking screw which was the transverse screw placed.  Once this was confirmed the attention was turned to the distal locking screws  C-arm was brought in placed on max magnification and perpendicular to the leg and then we placed 2 distal locking screws through 1 hole using the freehand technique.  We then irrigated all the wounds closed the proximal wound with #1 Braylon 0 Monocryl staples the second wound 0 Monocryl in the last wound with 2 layers of 0 Monocryl Assisted by Brantley  EBL:  300 mL   BLOOD ADMINISTERED:none  DRAINS: none   LOCAL MEDICATIONS USED: Marcaine with epinephrine 60 cc  SPECIMEN:  No Specimen  DISPOSITION OF SPECIMEN:  N/A  COUNTS:  YES  TOURNIQUET:  * No tourniquets in log *  DICTATION: .Dragon Dictation  PLAN OF CARE: Admit to inpatient   PATIENT DISPOSITION:  PACU - hemodynamically stable.   Delay start of Pharmacological VTE agent (>24hrs) due to surgical blood loss or risk of bleeding: not applicable

## 2019-02-18 NOTE — Anesthesia Procedure Notes (Signed)
Procedure Name: Intubation Date/Time: 02/18/2019 10:39 AM Performed by: Ollen Bowl, CRNA Pre-anesthesia Checklist: Patient identified, Patient being monitored, Timeout performed, Emergency Drugs available and Suction available Patient Re-evaluated:Patient Re-evaluated prior to induction Oxygen Delivery Method: Circle System Utilized Preoxygenation: Pre-oxygenation with 100% oxygen Induction Type: IV induction Ventilation: Mask ventilation without difficulty Laryngoscope Size: Mac and 3 Grade View: Grade II Tube type: Oral Tube size: 7.0 mm Number of attempts: 1 Airway Equipment and Method: stylet Placement Confirmation: ETT inserted through vocal cords under direct vision,  positive ETCO2 and breath sounds checked- equal and bilateral Secured at: 21 cm Tube secured with: Tape Dental Injury: Teeth and Oropharynx as per pre-operative assessment

## 2019-02-18 NOTE — Brief Op Note (Signed)
02/18/2019  1:36 PM  PATIENT:  Derrek Monaco [place]  28 y.o. female  PRE-OPERATIVE DIAGNOSIS:  left femur fracture  POST-OPERATIVE DIAGNOSIS:  left femur fracture  Surgical findings oblique fracture just distal to the midshaft of the femur  PROCEDURE:  Procedure(s): INTRAMEDULLARY (IM) NAIL FEMORAL (Left) 27506  We get a greater trochanteric entry Synthes nail which was 380 mm with proximal and distal locking to proximal locks and 2 distal locks   SURGEON:  Surgeon(s) and Role:    * Carole Civil, MD - Primary  Assisted by Simonne Maffucci  Anesthesia General  EBL:  300 mL   BLOOD ADMINISTERED:none  DRAINS: none   LOCAL MEDICATIONS USED: Marcaine with epinephrine 60 cc  SPECIMEN:  No Specimen  DISPOSITION OF SPECIMEN:  N/A  COUNTS:  YES  TOURNIQUET:  * No tourniquets in log *  DICTATION: .Dragon Dictation  PLAN OF CARE: Admit to inpatient   PATIENT DISPOSITION:  PACU - hemodynamically stable.   Delay start of Pharmacological VTE agent (>24hrs) due to surgical blood loss or risk of bleeding: not applicable

## 2019-02-18 NOTE — Transfer of Care (Signed)
Immediate Anesthesia Transfer of Care Note  Patient: Molly Graves  Procedure(s) Performed: INTRAMEDULLARY (IM) NAIL FEMORAL (Left Thigh)  Patient Location: PACU  Anesthesia Type:General  Level of Consciousness: awake  Airway & Oxygen Therapy: Patient Spontanous Breathing  Post-op Assessment: Report given to RN  Post vital signs: Reviewed and stable  Last Vitals:  Vitals Value Taken Time  BP 113/52 02/18/19 1345  Temp 36.7 C 02/18/19 1343  Pulse 58 02/18/19 1353  Resp 16 02/18/19 1353  SpO2 97 % 02/18/19 1353  Vitals shown include unvalidated device data.  Last Pain:  Vitals:   02/18/19 1343  TempSrc:   PainSc: 0-No pain      Patients Stated Pain Goal: 4 (70/26/37 8588)  Complications: No apparent anesthesia complications

## 2019-02-19 ENCOUNTER — Ambulatory Visit: Payer: Medicaid Other | Admitting: Certified Nurse Midwife

## 2019-02-19 ENCOUNTER — Encounter (HOSPITAL_COMMUNITY): Payer: Self-pay | Admitting: Orthopedic Surgery

## 2019-02-19 LAB — BASIC METABOLIC PANEL
Anion gap: 5 (ref 5–15)
BUN: 6 mg/dL (ref 6–20)
CO2: 22 mmol/L (ref 22–32)
Calcium: 7.8 mg/dL — ABNORMAL LOW (ref 8.9–10.3)
Chloride: 110 mmol/L (ref 98–111)
Creatinine, Ser: 0.81 mg/dL (ref 0.44–1.00)
GFR calc Af Amer: >60 mL/min (ref >60)
GFR calc non Af Amer: >60 mL/min (ref >60)
Glucose, Bld: 105 mg/dL — ABNORMAL HIGH (ref 70–99)
Potassium: 3.7 mmol/L (ref 3.5–5.1)
Sodium: 137 mmol/L (ref 135–145)

## 2019-02-19 LAB — HIV ANTIBODY (ROUTINE TESTING W REFLEX): HIV Screen 4th Generation wRfx: NONREACTIVE

## 2019-02-19 LAB — CBC
HCT: 32.9 % — ABNORMAL LOW (ref 36.0–46.0)
Hemoglobin: 10.2 g/dL — ABNORMAL LOW (ref 12.0–15.0)
MCH: 28.8 pg (ref 26.0–34.0)
MCHC: 31 g/dL (ref 30.0–36.0)
MCV: 92.9 fL (ref 80.0–100.0)
Platelets: 216 10*3/uL (ref 150–400)
RBC: 3.54 MIL/uL — ABNORMAL LOW (ref 3.87–5.11)
RDW: 14.1 % (ref 11.5–15.5)
WBC: 7.8 10*3/uL (ref 4.0–10.5)
nRBC: 0 % (ref 0.0–0.2)

## 2019-02-19 NOTE — Clinical Social Work Note (Signed)
.  Patient is agreeable to HHPT. Discussed equipment recommendations made by PT.  Referral made to [facility] at [facility] for PT and sent to main offer for approval. [place] and [place] offices declined patient. Alvis Lemmings declined referral due to insurance payor source.  Patient resides at [address] [place], [place] [address]. Her phone number is [phone].  DME delivered to room.  Order placed for out patient physical therapy clinic in [place]. Mandy at clinic confirmed order and indicated that a therapist would be in touch with patient to schedule. Patient notified.   Patient declines ETOH resources and denies issues with ETOH and reports that this was an isolated incident.    Jacci Ruberg, Clydene Pugh, LCSW

## 2019-02-19 NOTE — Progress Notes (Signed)
Patient ID: Molly Graves, female   DOB: 11/17/1990, 28 y.o.   MRN: [id number] Postop day 1 status post closed intramedullary nailing left femur  Patient says she is feeling better does complain of some perineal pain but no numbness she is dorsiflexing her foot no tingling in the foot toes are moving well color capillary refill normal in the left foot calf soft and supple bilaterally  CBC Latest Ref Rng & Units 02/19/2019 02/18/2019 02/17/2019  WBC 4.0 - 10.5 K/uL 7.8 11.7(H) 13.2(H)  Hemoglobin 12.0 - 15.0 g/dL 10.2(L) 12.3 14.0  Hematocrit 36.0 - 46.0 % 32.9(L) 39.7 42.7  Platelets 150 - 400 K/uL 216 244 279    Recommend physical therapy weight-bear as tolerated  Anticipate discharge today

## 2019-02-19 NOTE — Plan of Care (Signed)
  Problem: Acute Rehab PT Goals(only PT should resolve) Goal: Pt Will Go Supine/Side To Sit Outcome: Progressing Flowsheets (Taken 02/19/2019 1416) Pt will go Supine/Side to Sit: Independently Goal: Patient Will Transfer Sit To/From Stand Outcome: Progressing Flowsheets (Taken 02/19/2019 1416) Patient will transfer sit to/from stand: with modified independence Goal: Pt Will Transfer Bed To Chair/Chair To Bed Outcome: Progressing Flowsheets (Taken 02/19/2019 1416) Pt will Transfer Bed to Chair/Chair to Bed: with modified independence Goal: Pt Will Ambulate Outcome: Progressing Flowsheets (Taken 02/19/2019 1416) Pt will Ambulate:  > 125 feet  with modified independence  with crutches   2:16 PM, 02/19/19 Lonell Grandchild, MPT Physical Therapist with [facility] [phone] office [phone] mobile phone

## 2019-02-19 NOTE — Progress Notes (Signed)
Patient ID: Molly Graves, female   DOB: 15-May-1991, 28 y.o.   MRN: [id number]  Due to not being able to get home PT  Delay discharge tlll tomorrow   BP 110/60 (BP Location: Right Arm)   Pulse 67   Temp 98.9 F (37.2 C) (Oral)   Resp 18   Ht 5\' 4"  (1.626 m)   Wt 77.1 kg   LMP 02/07/2019   SpO2 100%   BMI 29.18 kg/m

## 2019-02-19 NOTE — Evaluation (Signed)
Physical Therapy Evaluation Patient Details Name: Molly Graves MRN: [id number] DOB: 04/18/1991 Today's Date: 02/19/2019   History of Present Illness  This patient is a 28 year old female, s/p INTRAMEDULLARY (IM) NAIL FEMORAL (Left) 02/19/19 secondary to fall with left hip fracture, she denies any prior surgical history, struggles with depression but not taking any daily medicines.  She does drink heavily last night, this morning she was stumbling lost her balance and fell into the coffee table with her left thigh.  This broke the table but also caused her to have severe pain and deformity and the inability to stand on her thigh.  This was acute in onset, persistent, worse with trying to move it or touch it, not associated with any bleeding or wounds.  She was brought to the hospital by family members.    Clinical Impression  Patient demonstrates good return for moving LLE during bed mobility, use of crutches for transfers and gait in room/hallways without loss of balance, has difficulty transferring to commode in bathroom due to low to floor and will benefit from a 3 in 1 BSC and axillary crutches for home use.  Patient tolerated sitting up in chair after therapy - RN notified.  Patient will benefit from continued physical therapy in hospital and recommended venue below to increase strength, balance, endurance for safe ADLs and gait.    Follow Up Recommendations Home health PT    Equipment Recommendations  Crutches;3in1 (PT)    Recommendations for Other Services       Precautions / Restrictions Precautions Precautions: Fall Restrictions Weight Bearing Restrictions: Yes LLE Weight Bearing: Weight bearing as tolerated      Mobility  Bed Mobility Overal bed mobility: Modified Independent             General bed mobility comments: increased time  Transfers Overall transfer level: Needs assistance Equipment used: Crutches Transfers: Sit to/from [employer] Transfers Sit to  Stand: Supervision Stand pivot transfers: Min guard       General transfer comment: good return for transferring from bed to chair, but has difficulty transferring to commode in bathroom due to low requiring some hands on assistance  Ambulation/Gait Ambulation/Gait assistance: Supervision Gait Distance (Feet): 120 Feet Assistive device: Crutches Gait Pattern/deviations: Decreased step length - left;Decreased stance time - left;Decreased stride length Gait velocity: decreased   General Gait Details: demonstrates good return for use of axillary crutches with mostly 3 point gait pattern with fair return for left heel to stepping without loss of balance  Stairs            Wheelchair Mobility    Modified Rankin (Stroke Patients Only)       Balance Overall balance assessment: Needs assistance Sitting-balance support: Feet supported;No upper extremity supported Sitting balance-Leahy Scale: Good     Standing balance support: During functional activity;Bilateral upper extremity supported Standing balance-Leahy Scale: Fair Standing balance comment: fair/good using bilateral axillary crutches                             Pertinent Vitals/Pain Pain Assessment: 0-10 Pain Score: 6  Pain Location: left hip Pain Descriptors / Indicators: Sore Pain Intervention(s): Limited activity within patient's tolerance;Monitored during session    Home Living Family/patient expects to be discharged to:: Private residence Living Arrangements: Spouse/significant other;Children Available Help at Discharge: Family;Available 24 hours/day Type of Home: House Home Access: Stairs to enter Entrance Stairs-Rails: None Entrance Stairs-Number of Steps: 1 Home Layout: One  level Home Equipment: None      Prior Function Level of Independence: Independent         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, drives, 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     Hand Dominance        Extremity/Trunk Assessment    Upper Extremity Assessment Upper Extremity Assessment: Overall WFL for tasks assessed    Lower Extremity Assessment Lower Extremity Assessment: Overall WFL for tasks assessed;LLE deficits/detail LLE Deficits / Details: grossly -4/5 LLE: Unable to fully assess due to pain LLE Sensation: WNL LLE Coordination: WNL    Cervical / Trunk Assessment Cervical / Trunk Assessment: Normal  Communication   Communication: No difficulties  Cognition Arousal/Alertness: Awake/alert Behavior During Therapy: WFL for tasks assessed/performed Overall Cognitive Status: Within Functional Limits for tasks assessed                                        General Comments      Exercises Total Joint Exercises Ankle Circles/Pumps: Supine;5 reps;AROM;Strengthening Quad Sets: Supine;5 reps;Strengthening;AROM Short Arc Quad: Supine;5 reps;Strengthening;AROM Heel Slides: AROM;Strengthening;5 reps;Supine   Assessment/Plan    PT Assessment Patient needs continued PT services  PT Problem List Decreased strength;Decreased activity tolerance;Decreased balance;Decreased mobility       PT Treatment Interventions Gait training;Functional mobility training;Therapeutic activities;Therapeutic exercise;Stair training;Patient/family education;DME instruction;Balance training    PT Goals (Current goals can be found in the Care Plan section)  Acute Rehab PT Goals Patient Stated Goal: return home with spouse to assist PT Goal Formulation: With patient Time For Goal Achievement: 02/22/19 Potential to Achieve Goals: Good    Frequency Min 5X/week   Barriers to discharge        Co-evaluation               AM-PAC PT "6 Clicks" Mobility  Outcome Measure Help needed turning from your back to your side while in a flat bed without using bedrails?: None Help needed moving from lying on your back to sitting on the side of a flat bed without using bedrails?: None Help needed moving to and from a  bed to a chair (including a wheelchair)?: A Little Help needed standing up from a chair using your arms (e.g., wheelchair or bedside chair)?: A Little Help needed to walk in hospital room?: A Little Help needed climbing 3-5 steps with a railing? : A Little 6 Click Score: 20    End of Session   Activity Tolerance: Patient tolerated treatment well;Patient limited by fatigue Patient left: in chair;with call bell/phone within reach Nurse Communication: Mobility status PT Visit Diagnosis: Unsteadiness on feet (R26.81);Other abnormalities of gait and mobility (R26.89);Muscle weakness (generalized) (M62.81)    Time: [id number] PT Time Calculation (min) (ACUTE ONLY): 32 min   Charges:   PT Evaluation $PT Eval Moderate Complexity: 1 Mod PT Treatments $Therapeutic Activity: 23-37 mins        2:14 PM, 02/19/19 Ocie BobJames Sheamus Hasting, MPT Physical Therapist [facility] [phone] office [phone] mobile phone

## 2019-02-20 LAB — CBC
HCT: 33 % — ABNORMAL LOW (ref 36.0–46.0)
Hemoglobin: 10.2 g/dL — ABNORMAL LOW (ref 12.0–15.0)
MCH: 28.9 pg (ref 26.0–34.0)
MCHC: 30.9 g/dL (ref 30.0–36.0)
MCV: 93.5 fL (ref 80.0–100.0)
Platelets: 200 10*3/uL (ref 150–400)
RBC: 3.53 MIL/uL — ABNORMAL LOW (ref 3.87–5.11)
RDW: 14.1 % (ref 11.5–15.5)
WBC: 6.4 10*3/uL (ref 4.0–10.5)
nRBC: 0 % (ref 0.0–0.2)

## 2019-02-20 LAB — BASIC METABOLIC PANEL
Anion gap: 5 (ref 5–15)
BUN: 5 mg/dL — ABNORMAL LOW (ref 6–20)
CO2: 24 mmol/L (ref 22–32)
Calcium: 8.3 mg/dL — ABNORMAL LOW (ref 8.9–10.3)
Chloride: 109 mmol/L (ref 98–111)
Creatinine, Ser: 0.69 mg/dL (ref 0.44–1.00)
GFR calc Af Amer: >60 mL/min (ref >60)
GFR calc non Af Amer: >60 mL/min (ref >60)
Glucose, Bld: 103 mg/dL — ABNORMAL HIGH (ref 70–99)
Potassium: 3.4 mmol/L — ABNORMAL LOW (ref 3.5–5.1)
Sodium: 138 mmol/L (ref 135–145)

## 2019-02-20 MED ORDER — IBUPROFEN 800 MG PO TABS: 800.0000 mg | ORAL_TABLET | Freq: Three times a day (TID) | ORAL | 1 refills | Status: DC | PRN

## 2019-02-20 MED ORDER — TIZANIDINE HCL 4 MG PO TABS: 4.0000 mg | ORAL_TABLET | Freq: Three times a day (TID) | ORAL | 1 refills | Status: DC | PRN

## 2019-02-20 MED ORDER — DOCUSATE SODIUM 100 MG PO CAPS: 100.0000 mg | ORAL_CAPSULE | Freq: Two times a day (BID) | ORAL | 0 refills | Status: DC

## 2019-02-20 MED ORDER — HYDROCODONE-ACETAMINOPHEN 7.5-325 MG PO TABS: 1.0000 | ORAL_TABLET | ORAL | 0 refills | Status: DC | PRN

## 2019-02-20 NOTE — Progress Notes (Signed)
Nsg Discharge Note  Admit Date:  02/17/2019 Discharge date: 02/20/2019   Molly Graves to be D/Graves'dHome per MD order.  AVS completed.   Patient able to verbalize understanding.  Discharge Medication: Allergies as of 02/20/2019   No Known Allergies     Medication List    TAKE these medications   docusate sodium 100 MG capsule Commonly known as: COLACE Take 1 capsule (100 mg total) by mouth 2 (two) times daily.   HYDROcodone-acetaminophen 7.5-325 MG tablet Commonly known as: NORCO Take 1 tablet by mouth every 4 (four) hours as needed for moderate pain.   ibuprofen 800 MG tablet Commonly known as: ADVIL Take 1 tablet (800 mg total) by mouth every 8 (eight) hours as needed.   tiZANidine 4 MG tablet Commonly known as: Zanaflex Take 1 tablet (4 mg total) by mouth every 8 (eight) hours as needed for muscle spasms.            Durable Medical Equipment  (From admission, onward)         Start     Ordered   02/19/19 1341  For home use only DME 3 n 1  Once     02/19/19 1340   02/19/19 0826  For home use only DME Walker  Once    Question:  Patient needs a walker to treat with the following condition  Answer:  Femur fracture, left ([place])   02/19/19 0825          Discharge Assessment: Vitals:   02/19/19 2131 02/20/19 0610  BP: 105/63 102/60  Pulse: 68 67  Resp:  18  Temp: 98.8 F (37.1 Graves) 97.6 F (36.4 Graves)  SpO2: 100% 100%   Skin clean, dry and intact without evidence of skin break down, no evidence of skin tears noted. IV catheter discontinued intact. Site without signs and symptoms of complications - no redness or edema noted at insertion site, patient denies Graves/o pain - only slight tenderness at site.  Dressing with slight pressure applied.  D/Graves Instructions-Education: Discharge instructions given to patient with verbalized understanding. D/Graves education completed with patient including follow up instructions, medication list, d/Graves activities limitations if indicated, with  other d/Graves instructions as indicated by MD - patient able to verbalize understanding, all questions fully answered. Patient instructed to return to ED, call 911, or call MD for any changes in condition.  Patient escorted via [place], and D/Graves home via private auto.  Molly Markwood C, RN 02/20/2019 10:13 AM

## 2019-02-20 NOTE — Discharge Summary (Signed)
Physician Discharge Summary  Patient ID: Molly Graves MRN: [id number] DOB/AGE: Aug 16, 1990 28 y.o.  Admit date: 02/17/2019 Discharge date: 02/20/2019  Admission Diagnoses: left femur closed shaft fracture oblique   Discharge Diagnoses: same  Active Problems:   Femur fracture ([place])   Closed fracture of femur, shaft ([place])   Discharged Condition: good  Hospital Course: This is a 28 year old female admitted on September 2 with a left closed femur fracture.  She underwent surgery on September 3 closed intramedullary nailing left femur with proximal distal locking using a Synthes trochanteric entry nail 10 x 380 mm 2 proximal locking screws 2 distal locking screws.  She tolerated that well  On September 4 she had physical therapy did well however, she was not able to get home physical therapy secondary to Medicaid and she was kept in the hospital extra day to allow for additional therapy and additional pain control and observation of numbness that she had in her left foot.  On September 5 her dressing was changed her wounds were clean her hip was sore as was her knee but this was expected.  She verbalized that her numbness in her foot was gone  CBC Latest Ref Rng & Units 02/20/2019 02/19/2019 02/18/2019  WBC 4.0 - 10.5 K/uL 6.4 7.8 11.7(H)  Hemoglobin 12.0 - 15.0 g/dL 10.2(L) 10.2(L) 12.3  Hematocrit 36.0 - 46.0 % 33.0(L) 32.9(L) 39.7  Platelets 150 - 400 K/uL 200 216 244   Discharge Exam: Blood pressure 102/60, pulse 67, temperature 97.6 F (36.4 C), temperature source Oral, resp. rate 18, height 5\' 4"  (1.626 m), weight 77.1 kg, last menstrual period 02/07/2019, SpO2 100 %, unknown if currently breastfeeding. See above dictation  Disposition: Discharge disposition: 01-Home or Self Care       Discharge Instructions    Ambulatory referral to Physical Therapy   Complete by: As directed    Call MD / Call 911   Complete by: As directed    If you experience chest pain or shortness of  breath, CALL 911 and be transported to the hospital emergency room.  If you develope a fever above 101 F, pus (white drainage) or increased drainage or redness at the wound, or calf pain, call your surgeon's office.   Constipation Prevention   Complete by: As directed    Drink plenty of fluids.  Prune juice may be helpful.  You may use a stool softener, such as Colace (over the counter) 100 mg twice a day.  Use MiraLax (over the counter) for constipation as needed.   Diet - low sodium heart healthy   Complete by: As directed    Discharge instructions   Complete by: As directed    APPLY ICE TO THE THIGH AND KNEE 6 X A DAY FOR 30 MIN (A BAG OF FROZEN VEGETABLES WORKS WELL, GET 2 BAGS AND THEY CAN BE EXCHANGED AT [place])  DO THE EXERCISES AS INSTRUCTED BY PHYSICAL THERAPY   KEEP YOUR DRESSING CLEAN AND DRY  NO SHOWER UNTIL AFTER APPT WITH DR Aline Brochure   Driving restrictions   Complete by: As directed    No driving for 2 weeks   Increase activity slowly as tolerated   Complete by: As directed      Allergies as of 02/20/2019   No Known Allergies     Medication List    TAKE these medications   docusate sodium 100 MG capsule Commonly known as: COLACE Take 1 capsule (100 mg total) by mouth 2 (two) times  daily.   HYDROcodone-acetaminophen 7.5-325 MG tablet Commonly known as: NORCO Take 1 tablet by mouth every 4 (four) hours as needed for moderate pain.   ibuprofen 800 MG tablet Commonly known as: ADVIL Take 1 tablet (800 mg total) by mouth every 8 (eight) hours as needed.   tiZANidine 4 MG tablet Commonly known as: Zanaflex Take 1 tablet (4 mg total) by mouth every 8 (eight) hours as needed for muscle spasms.            Durable Medical Equipment  (From admission, onward)         Start     Ordered   02/19/19 1341  For home use only DME 3 n 1  Once     02/19/19 1340   02/19/19 0826  For home use only DME Walker  Once    Question:  Patient needs a walker to treat  with the following condition  Answer:  Femur fracture, left ([facility])   02/19/19 0825         Follow-up Information    Vickki HearingHarrison, Cid Agena E, MD Follow up on 03/05/2019.   Specialties: Orthopedic Surgery, Radiology Why: staples out xrays hip femur knee  Contact information: [address] [place] [address] [phone]           Signed: Fuller CanadaStanley Susy Placzek 02/20/2019, 9:17 AM

## 2019-02-23 ENCOUNTER — Telehealth: Payer: Self-pay | Admitting: Radiology

## 2019-02-23 DIAGNOSIS — Z9889 Other specified postprocedural states: Secondary | ICD-10-CM

## 2019-02-23 DIAGNOSIS — Z8781 Personal history of (healed) traumatic fracture: Secondary | ICD-10-CM

## 2019-02-23 NOTE — Telephone Encounter (Signed)
-----   Message from Carole Civil, MD sent at 02/20/2019  9:19 AM EDT ----- Arrange appointment for postop staples out x-rays hip femur knee on September 18Amy review with patient outpatient physical therapy orders [facility] or in [place] if it is better for her from a right standpoint

## 2019-02-23 NOTE — Telephone Encounter (Signed)
Left message for patient to call me back, she called back almost immediately. She is in [place], and there is not a physical therapy office there, she states the nearest closest is [place], so order sent to [facility].

## 2019-02-24 ENCOUNTER — Encounter: Payer: Self-pay | Admitting: Orthopedic Surgery

## 2019-03-01 ENCOUNTER — Telehealth (HOSPITAL_COMMUNITY): Payer: Self-pay

## 2019-03-01 NOTE — Telephone Encounter (Signed)
03/01/19  pt called to cx and rescheduled for 9/22

## 2019-03-02 ENCOUNTER — Ambulatory Visit (HOSPITAL_COMMUNITY): Payer: Medicaid Other

## 2019-03-04 DIAGNOSIS — Z8781 Personal history of (healed) traumatic fracture: Secondary | ICD-10-CM | POA: Insufficient documentation

## 2019-03-04 DIAGNOSIS — Z9889 Other specified postprocedural states: Secondary | ICD-10-CM | POA: Insufficient documentation

## 2019-03-05 ENCOUNTER — Encounter: Payer: Self-pay | Admitting: Orthopedic Surgery

## 2019-03-05 ENCOUNTER — Ambulatory Visit: Payer: Medicaid Other | Admitting: Orthopedic Surgery

## 2019-03-09 ENCOUNTER — Other Ambulatory Visit: Payer: Self-pay

## 2019-03-09 ENCOUNTER — Ambulatory Visit (HOSPITAL_COMMUNITY): Payer: Medicaid Other | Attending: Orthopedic Surgery

## 2019-03-09 ENCOUNTER — Encounter (HOSPITAL_COMMUNITY): Payer: Self-pay

## 2019-03-09 DIAGNOSIS — M25552 Pain in left hip: Secondary | ICD-10-CM | POA: Diagnosis not present

## 2019-03-09 DIAGNOSIS — M6281 Muscle weakness (generalized): Secondary | ICD-10-CM | POA: Insufficient documentation

## 2019-03-09 DIAGNOSIS — M25562 Pain in left knee: Secondary | ICD-10-CM | POA: Insufficient documentation

## 2019-03-09 DIAGNOSIS — R2689 Other abnormalities of gait and mobility: Secondary | ICD-10-CM | POA: Diagnosis present

## 2019-03-09 NOTE — Therapy (Signed)
[facility] [address] [place], [place], [address] Phone: [phone]   Fax:  [phone]  Physical Therapy Evaluation  Patient Details  Name: Molly Graves MRN: [id number] Date of Birth: 07-13-1990 Referring Provider (PT): Fuller Canada, MD   Encounter Date: 03/09/2019  PT End of Session - 03/09/19 1541    Visit Number  1    Number of Visits  9    Date for PT Re-Evaluation  05/04/19    Authorization Type  Medicaid (check approval)    Authorization Time Period  03/09/19 to 05/07/19    Authorization - Visit Number  0    Authorization - Number of Visits  8    PT Start Time  1351   pt arrived late   PT Stop Time  1425    PT Time Calculation (min)  34 min    Activity Tolerance  Patient tolerated treatment well    Behavior During Therapy  [facility] for tasks assessed/performed       Past Medical History:  Diagnosis Date  . Anemia   . Depression    post partum depression  . Headache    MIGRAINES  . Herpes genitalia     Past Surgical History:  Procedure Laterality Date  . BREAST SURGERY     lumpectomy left breast  . DIAGNOSTIC LAPAROSCOPY  2015   to rule out uterine injury after D&E  . FEMUR IM NAIL Left 02/18/2019   Procedure: INTRAMEDULLARY (IM) NAIL FEMORAL;  Surgeon: Vickki Hearing, MD;  Location: [facility];  Service: Orthopedics;  Laterality: Left;  . HYSTEROSCOPY W/D&C N/A 04/19/2016   Procedure: DILATATION AND CURETTAGE /HYSTEROSCOPY;  Surgeon: Nadara Mustard, MD;  Location: [facility];  Service: Gynecology;  Laterality: N/A;  . INDUCED ABORTION  2015  . IUD REMOVAL N/A 04/19/2016   Procedure: INTRAUTERINE DEVICE (IUD) REMOVAL;  Surgeon: Nadara Mustard, MD;  Location: [facility] OR[facility];  Service: Gynecology;  Laterality: N/A;  . LAPAROSCOPY N/A 04/19/2016   Procedure: LAPAROSCOPY DIAGNOSTIC;  Surgeon: Nadara Mustard, MD;  Location: [facility] ORS;  Service: Gynecology;  Laterality: N/A;    There were no vitals filed for this  visit.   Subjective Assessment - 03/09/19 1357    Subjective  Pt reports 02/17/19 during a fall. Pt reports initially after the surgery everything was hard, but boyfriend helps her a lot. Pt reports d/c crutches about a week ago. Pt reports missed f/u last Friday and is attempting to reschedule ASAP. Pt reports no difficulty other than increase in pain with ambulation and pain during intercourse in L knee and hip. Pt reports limited walking with 36month old. Pt reports bil feet numbness with prolonged standing R>L.    Limitations  Walking    How long can you sit comfortably?  no issues    How long can you stand comfortably?  no issues    How long can you walk comfortably?  increase pain    Patient Stated Goals  to be able to bend knee again    Currently in Pain?  Yes    Pain Score  4     Pain Location  Knee    Pain Orientation  Left    Pain Descriptors / Indicators  Sore    Pain Type  Acute pain    Pain Onset  1 to 4 weeks ago    Pain Frequency  Intermittent    Aggravating Factors   walking    Pain Relieving Factors  pain medication    Effect of Pain on Daily Activities  limited         [facility] PT Assessment - 03/09/19 0001      Assessment   Medical Diagnosis  ORIF L femur    Referring Provider (PT)  Arther Abbott, MD    Onset Date/Surgical Date  02/18/19    Next MD Visit  attempting to reschedule missed 2 week f/u    Prior Therapy  Acute PT      Precautions   Precautions  None      Restrictions   Weight Bearing Restrictions  No      Balance Screen   Has the patient fallen in the past 6 months  Yes    How many times?  1, ORIF intervention    Has the patient had a decrease in activity level because of a fear of falling?   No    Is the patient reluctant to leave their home because of a fear of falling?   No      Prior Function   Level of Independence  Independent    Vocation  Unemployed    Leisure  play with kids, netflix      Cognition   Overall Cognitive Status   Within Functional Limits for tasks assessed      Observation/Other Assessments   Skin Integrity  lower bandage removed, no drainage, redness, warmth, or signs of infection noted    Focus on Therapeutic Outcomes (FOTO)   n/a      Sensation   Light Touch  Appears Intact   bil foot numbness complaints with prolonged standing     Functional Tests   Functional tests  Sit to Stand      Sit to Stand   Comments  5xSTS: 19 sec, from chair, no UE assist   BLE genu vaglus, L>R     ROM / Strength   AROM / PROM / Strength  AROM;Strength      AROM   AROM Assessment Site  Knee    Right/Left Knee  Right;Left    Right Knee Extension  0    Right Knee Flexion  130    Left Knee Extension  0    Left Knee Flexion  74      Strength   Strength Assessment Site  Hip;Knee;Ankle    Right Hip Flexion  5/5    Right Hip Extension  4+/5    Right Hip ABduction  4/5    Left Hip Flexion  3+/5    Left Hip Extension  4+/5    Left Hip ABduction  3/5    Right Knee Flexion  5/5    Right Knee Extension  5/5    Left Knee Flexion  4/5    Left Knee Extension  5/5    Right Ankle Dorsiflexion  5/5    Left Ankle Dorsiflexion  5/5      Flexibility   Soft Tissue Assessment /Muscle Length  yes    Hamstrings  90/90: WNL bil    ITB  no deficits bilaterally      Palpation   Patella mobility  normal to slightly hypermobile bilaterally    Palpation comment  tenderness to palpation throughout surgical sites      Ambulation/Gait   Ambulation/Gait  Yes    Ambulation/Gait Assistance  6: Modified independent (Device/Increase time)    Assistive device  None    Gait Pattern  Decreased step length - right;Decreased stance time -  left;Decreased hip/knee flexion - left;Decreased weight shift to left;Trendelenburg;Antalgic   genu valgum bilaterally, L>R   Gait Comments  observed pt ambulate throughout clinic, no unsteadiness or loss of balance      Balance   Balance Assessed  Yes      Static Standing Balance   Static  Standing - Balance Support  No upper extremity supported    Static Standing Balance -  Activities   Single Leg Stance - Right Leg;Single Leg Stance - Left Leg    Static Standing - Comment/# of Minutes  R: 30+ sec, L: 9 sec           Objective measurements completed on examination: See above findings.      PT Education - 03/09/19 1401    Education Details  Assessment findings, POC, initiated HEP    Person(s) Educated  Patient    Methods  Explanation    Comprehension  Verbalized understanding       PT Short Term Goals - 03/09/19 1551      PT SHORT TERM GOAL #1   Title  Pt will be compliant with HEP, perform 4x/week to improve strength and gait mechanics.    Time  4    Period  Weeks    Status  New    Target Date  04/06/19      PT SHORT TERM GOAL #2   Title  Pt will perform L SLS for 15 sec to demo more symmetrical functional strength and balance compared to RLE.    Time  4    Period  Weeks    Status  New      PT SHORT TERM GOAL #3   Title  Pt will perform 5x STS in 15 sec or < without increase in L genu valgus to demo improved funcitonal strength and reduction in pain.    Time  4    Period  Weeks    Status  New        PT Long Term Goals - 03/09/19 1553      PT LONG TERM GOAL #1   Title  Pt will improve LLE strength to equal to RLE strength to improve gait mechanics, standing tolerance, and ability to care for children.    Time  8    Period  Weeks    Status  New    Target Date  05/04/19      PT LONG TERM GOAL #2   Title  Pt will improve L knee AROM to 0-130 to improve gait mechanics, ascending/descending from porch, and recreation activities.    Time  8    Period  Weeks    Status  New      PT LONG TERM GOAL #3   Title  Pt will ambulate at 1.0 m/s gait speed to safely ambulate community distances safely and decrease risk for falls.    Time  8    Period  Weeks    Status  New             Plan - 03/09/19 1543    Clinical Impression Statement  Pt is  a pleasant 28 YO female s/p L IMN ORIF on 02/18/19. Pt demonstrates deficits in L knee AROM, LE weakness, impaired gait mechanics, decreased balance, impaired transfer technique and overall increase pain with functional mobility. Objectively, pt with significantly decreased L knee AROM due to increased pain, only able to achieve 74 degrees of knee flexion. Per MMT, pt with decreased BLE strength,  L>R. Pt demonstrates balance deficits in SLS. Functionally, pt with impaired form with transfers and gait pattern, increased L genu valgus with mobility. Pt with pain complaints with mobility due to lack of range and muscle weakness. Recommended 2x/week for 8 weeks, but pt requested 1x/week for personal reasons; pt would benefit from skilled PT to improve deficits noted, reduce pain and improve QoL.    Personal Factors and Comorbidities  --    Examination-Activity Limitations  Locomotion Level;Stairs    Examination-Participation Restrictions  Cleaning;Meal Prep    Stability/Clinical Decision Making  Stable/Uncomplicated    Clinical Decision Making  Low    Rehab Potential  Good    PT Frequency  1x / week    PT Duration  8 weeks    PT Treatment/Interventions  ADLs/Self Care Home Management;Aquatic Therapy;Biofeedback;Cryotherapy;Electrical Stimulation;Moist Heat;DME Instruction;Gait training;Stair training;Functional mobility training;Therapeutic activities;Therapeutic exercise;Balance training;Neuromuscular re-education;Patient/family education;Orthotic Fit/Training;Manual techniques;Scar mobilization;Passive range of motion;Dry needling;Taping;Joint Manipulations    PT Next Visit Plan  Review goals. Initiate BLE strengthening. Update HEP weekly due to 1x/week POC.    PT Home Exercise Plan  Eval: bridges, sidelying hip abduction, heel slides (sitting)    Consulted and Agree with Plan of Care  Patient       Patient will benefit from skilled therapeutic intervention in order to improve the following deficits and  impairments:  Abnormal gait, Decreased activity tolerance, Decreased balance, Decreased range of motion, Decreased strength, Difficulty walking, Impaired sensation, Improper body mechanics, Pain  Visit Diagnosis: Pain in left hip  Acute pain of left knee  Other abnormalities of gait and mobility  Muscle weakness (generalized)     Problem List Patient Active Problem List   Diagnosis Date Noted  . S/P ORIF (open reduction internal fixation) fracture IM nail left femur 02/18/2019 03/04/2019  . Femur fracture (HCC) 02/17/2019  . Closed fracture of femur, shaft (HCC) 02/17/2019  . Indication for care in labor and delivery, antepartum 12/25/2018  . Normal vaginal delivery 12/25/2018  . Postpartum care following vaginal delivery 12/25/2018  . Dysuria 12/13/2018  . Back pain affecting pregnancy in third trimester 11/17/2018  . MDD (major depressive disorder) 10/10/2018  . Severe recurrent major depression without psychotic features (HCC) 10/10/2018  . 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 Iron deficiency anemia 09/03/2018  . Pyelonephritis affecting pregnancy in second trimester 08/08/2018  . Supervision of other normal pregnancy, antepartum 05/22/2018  . Complication of intrauterine device (IUD) (HCC) 04/19/2016     Domenick Bookbinder PT, DPT 03/09/19, 3:57 PM [phone]  [facility] [address] [place], [place], [address] Phone: [phone]   Fax:  [phone]  Name: Molly Graves MRN: [id number] Date of Birth: [birth year]

## 2019-03-12 ENCOUNTER — Ambulatory Visit: Payer: Medicaid Other | Admitting: Orthopedic Surgery

## 2019-03-15 ENCOUNTER — Encounter: Payer: Self-pay | Admitting: Orthopedic Surgery

## 2019-03-15 ENCOUNTER — Ambulatory Visit: Payer: Medicaid Other

## 2019-03-15 ENCOUNTER — Ambulatory Visit (INDEPENDENT_AMBULATORY_CARE_PROVIDER_SITE_OTHER): Payer: Medicaid Other | Admitting: Orthopedic Surgery

## 2019-03-15 ENCOUNTER — Encounter (HOSPITAL_COMMUNITY): Payer: Medicaid Other

## 2019-03-15 ENCOUNTER — Other Ambulatory Visit: Payer: Self-pay

## 2019-03-15 DIAGNOSIS — S72001D Fracture of unspecified part of neck of right femur, subsequent encounter for closed fracture with routine healing: Secondary | ICD-10-CM

## 2019-03-15 DIAGNOSIS — T8149XA Infection following a procedure, other surgical site, initial encounter: Secondary | ICD-10-CM | POA: Diagnosis not present

## 2019-03-15 DIAGNOSIS — S72002D Fracture of unspecified part of neck of left femur, subsequent encounter for closed fracture with routine healing: Secondary | ICD-10-CM | POA: Diagnosis not present

## 2019-03-15 MED ORDER — CEPHALEXIN 500 MG PO CAPS: 500.0000 mg | ORAL_CAPSULE | Freq: Two times a day (BID) | ORAL | 0 refills | Status: DC

## 2019-03-15 NOTE — Progress Notes (Signed)
POST OP VISIT   POD 25 MISSED SEVERAL APPTS   Chief Complaint  Patient presents with  . Post-op Follow-up    02/18/2019 left femur post op     Encounter Diagnoses  Name Primary?  . Closed fracture of neck of right femur with routine healing, subsequent encounter   . Cellulitis, wound, post-operative Yes     Current Outpatient Medications:  .  ibuprofen (ADVIL) 800 MG tablet, Take 1 tablet (800 mg total) by mouth every 8 (eight) hours as needed., Disp: 90 tablet, Rfl: 1 .  tiZANidine (ZANAFLEX) 4 MG tablet, Take 1 tablet (4 mg total) by mouth every 8 (eight) hours as needed for muscle spasms., Disp: 45 tablet, Rfl: 1 .  cephALEXin (KEFLEX) 500 MG capsule, Take 1 capsule (500 mg total) by mouth 2 (two) times daily., Disp: 28 capsule, Rfl: 0 .  HYDROcodone-acetaminophen (NORCO) 7.5-325 MG tablet, Take 1 tablet by mouth every 4 (four) hours as needed for moderate pain. (Patient not taking: Reported on 03/15/2019), Disp: 42 tablet, Rfl: 0  THERE ARE NO COMPLAINTS   THE PROXIMAL WOUNDs LOOK CLEAN AND there was some drainage from the distal wound  She did complain of some knee pain NEUROVASCULAR EXAM IS NORMAL   EVERYTHING LOOKS GOOD   FU WILL BE SCHEDULED FOR 2 weeks to check the wound  The x-rays look great we talked about possible dynamization if we do not get good callus forming at the fracture site but do not think this should be an issue.

## 2019-03-17 ENCOUNTER — Ambulatory Visit (HOSPITAL_COMMUNITY): Payer: Medicaid Other

## 2019-03-17 ENCOUNTER — Telehealth (HOSPITAL_COMMUNITY): Payer: Self-pay

## 2019-03-17 NOTE — Telephone Encounter (Signed)
No Show#1. Called pt regarding missed appointment today at 2:30, pt reports she overslept from a nap. Educated pt on next appointment and encouraged pt to call if unable to make next appoint. Also educated pt to continue performing exercises to improve her strength. Pt with poor cell service, call dropped x1 and call going in/out, but able to complete conversation after multiple attempts.  Talbot Grumbling PT, DPT 03/17/19, 2:54 PM [phone]

## 2019-03-22 ENCOUNTER — Encounter (HOSPITAL_COMMUNITY): Payer: Medicaid Other

## 2019-03-24 ENCOUNTER — Ambulatory Visit (HOSPITAL_COMMUNITY): Payer: Medicaid Other

## 2019-03-24 ENCOUNTER — Telehealth (HOSPITAL_COMMUNITY): Payer: Self-pay

## 2019-03-24 NOTE — Telephone Encounter (Signed)
03/24/19  patient called to cx but no reason was given

## 2019-03-29 ENCOUNTER — Encounter (HOSPITAL_COMMUNITY): Payer: Medicaid Other

## 2019-03-29 ENCOUNTER — Encounter: Payer: Self-pay | Admitting: Orthopedic Surgery

## 2019-03-29 ENCOUNTER — Ambulatory Visit: Payer: Medicaid Other | Admitting: Orthopedic Surgery

## 2019-03-31 ENCOUNTER — Encounter (HOSPITAL_COMMUNITY): Payer: Self-pay | Admitting: Specialist

## 2019-03-31 ENCOUNTER — Telehealth (HOSPITAL_COMMUNITY): Payer: Self-pay

## 2019-03-31 ENCOUNTER — Ambulatory Visit (HOSPITAL_COMMUNITY): Payer: Medicaid Other | Attending: *Deleted

## 2019-03-31 NOTE — Telephone Encounter (Signed)
No Show #3. Therapist called pt regarding not showing for appointment today at 1:45, but unable to reach pt and unable to leave voicemail due to full voicemail box. Pt has not shown to therapy since initial evaluation on 03/09/19 and unable to reach pt despite multiple attempts. Sending pt letter regarding d/c therapy this date and educated to get new referral if wanting to begin therapy again.  Talbot Grumbling PT, DPT 03/31/19, 3:31 PM [phone]

## 2019-03-31 NOTE — Telephone Encounter (Signed)
Therapist called pt regarding not returning to therapy since initial evaluation and appointment today outside of medicaid authorization. Unable to reach patient x2 attempts and unable to leave voicemail due to voicemail box full. Will continue to attempt to reach pt and front office staff educated on pt outside [place] in case pt returns missed call.  Talbot Grumbling PT, DPT 03/31/19, 12:25 PM [phone]

## 2019-04-05 ENCOUNTER — Ambulatory Visit: Payer: Medicaid Other | Admitting: Certified Nurse Midwife

## 2019-04-05 ENCOUNTER — Encounter (HOSPITAL_COMMUNITY): Payer: Medicaid Other

## 2019-04-07 ENCOUNTER — Encounter (HOSPITAL_COMMUNITY): Payer: Medicaid Other

## 2019-04-12 ENCOUNTER — Encounter (HOSPITAL_COMMUNITY): Payer: Medicaid Other

## 2019-04-14 ENCOUNTER — Encounter (HOSPITAL_COMMUNITY): Payer: Medicaid Other

## 2019-04-19 ENCOUNTER — Encounter (HOSPITAL_COMMUNITY): Payer: Medicaid Other

## 2019-04-19 ENCOUNTER — Telehealth: Payer: Self-pay | Admitting: Licensed Clinical Social Worker

## 2019-04-19 NOTE — Telephone Encounter (Signed)
LCSW called and spoke with patient regarding referral for services. LCSW provided patient with crisis line number and discussed the importance of calling if her symptoms worsen and/or if she begins to have any "scary" thoughts - provided brief  psycho-education on postpartum depression and psychosis. Encouraged patient to take meds daily.  Scheduled patient for appointment.

## 2019-04-21 ENCOUNTER — Encounter (HOSPITAL_COMMUNITY): Payer: Medicaid Other

## 2019-04-26 ENCOUNTER — Encounter (HOSPITAL_COMMUNITY): Payer: Medicaid Other

## 2019-04-27 ENCOUNTER — Encounter: Payer: Self-pay | Admitting: Certified Nurse Midwife

## 2019-04-27 ENCOUNTER — Ambulatory Visit (INDEPENDENT_AMBULATORY_CARE_PROVIDER_SITE_OTHER): Payer: Medicaid Other | Admitting: Certified Nurse Midwife

## 2019-04-27 ENCOUNTER — Other Ambulatory Visit (HOSPITAL_COMMUNITY)
Admission: RE | Admit: 2019-04-27 | Discharge: 2019-04-27 | Disposition: A | Discharge status: Home / Self Care | Payer: Medicaid Other | Source: Ambulatory Visit | Attending: Certified Nurse Midwife | Admitting: Certified Nurse Midwife

## 2019-04-27 ENCOUNTER — Other Ambulatory Visit: Payer: Self-pay

## 2019-04-27 VITALS — BP 110/70 | HR 85 | Ht 64.0 in | Wt 155.0 lb

## 2019-04-27 DIAGNOSIS — Z124 Encounter for screening for malignant neoplasm of cervix: Secondary | ICD-10-CM

## 2019-04-27 DIAGNOSIS — N898 Other specified noninflammatory disorders of vagina: Secondary | ICD-10-CM

## 2019-04-27 DIAGNOSIS — Z30011 Encounter for initial prescription of contraceptive pills: Secondary | ICD-10-CM

## 2019-04-27 DIAGNOSIS — Z113 Encounter for screening for infections with a predominantly sexual mode of transmission: Secondary | ICD-10-CM

## 2019-04-27 DIAGNOSIS — B9689 Other specified bacterial agents as the cause of diseases classified elsewhere: Secondary | ICD-10-CM

## 2019-04-27 DIAGNOSIS — F332 Major depressive disorder, recurrent severe without psychotic features: Secondary | ICD-10-CM

## 2019-04-27 DIAGNOSIS — N760 Acute vaginitis: Secondary | ICD-10-CM

## 2019-04-27 MED ORDER — MICROGESTIN 24 FE 1-20 MG-MCG PO TABS: 1.0000 | ORAL_TABLET | Freq: Every day | ORAL | 11 refills | Status: DC

## 2019-04-27 MED ORDER — METRONIDAZOLE 500 MG PO TABS: 500.0000 mg | ORAL_TABLET | Freq: Two times a day (BID) | ORAL | 0 refills | Status: AC

## 2019-04-27 MED ORDER — FLUOXETINE HCL 20 MG PO CAPS: 20.0000 mg | ORAL_CAPSULE | Freq: Every day | ORAL | 3 refills | Status: DC

## 2019-04-27 NOTE — Progress Notes (Signed)
Postpartum Visit  Chief Complaint:  Chief Complaint  Patient presents with  . Postpartum Care    off and on foul smell; has white thick d/c now; doesn't have appetite;     History of Present Illness: Molly Graves is a 28 y.o. [id number] BF who presents for a 4 month postpartum visit.  Date of delivery: 12/25/2018 Type of delivery: Vaginal delivery - Vacuum or forceps assisted  no Episiotomy No.  Laceration: no  Pregnancy or labor problems:  Yes, history of depression with admission for depression/suicidl ideation, pyelonephritis, hx of HSV, anemia (received IV iron x 2) Any problems since the delivery:  Yes, fractured her left femur and had a ORIF of the femur 02/17/2019. Stopped taking prozac for a while postpartum, but restarted taking 2-3 weeks ago. Usually seen by [facility] has not followed up since delivery.  Starting to feel a little better. Has noticed a vaginal odor when on her menses.LMP 30 October. Menses every month since 15 August, lasting a few days.  Newborn Details:  SINGLETON :  1. Baby's name: Molly Graves. Birth weight: 7#3.3oz Maternal Details:  Breast Feeding:  no Post partum depression/anxiety noted:  yes Edinburgh Post-Partum Depression Score:  21  Date of last PAP: 12/11/2015 NIL  Review of Systems: ROS  Past Medical History:  Past Medical History:  Diagnosis Date  . Anemia   . Depression    post partum depression  . Headache    MIGRAINES  . Herpes genitalia     Past Surgical History:  Past Surgical History:  Procedure Laterality Date  . BREAST SURGERY     lumpectomy left breast  . DIAGNOSTIC LAPAROSCOPY  2015   to rule out uterine injury after D&E  . FEMUR IM NAIL Left 02/18/2019   Procedure: INTRAMEDULLARY (IM) NAIL FEMORAL;  Surgeon: Vickki Hearing, MD;  Location: [facility];  Service: Orthopedics;  Laterality: Left;  . HYSTEROSCOPY W/D&C N/A 04/19/2016   Procedure: DILATATION AND CURETTAGE /HYSTEROSCOPY;  Surgeon: Nadara Mustard,  MD;  Location: [facility] OR[facility];  Service: Gynecology;  Laterality: N/A;  . INDUCED ABORTION  2015  . IUD REMOVAL N/A 04/19/2016   Procedure: INTRAUTERINE DEVICE (IUD) REMOVAL;  Surgeon: Nadara Mustard, MD;  Location: [facility] ORS;  Service: Gynecology;  Laterality: N/A;  . LAPAROSCOPY N/A 04/19/2016   Procedure: LAPAROSCOPY DIAGNOSTIC;  Surgeon: Nadara Mustard, MD;  Location: [facility] ORS;  Service: Gynecology;  Laterality: N/A;    Family History:  Family History  Problem Relation Age of Onset  . Hypertension Maternal Aunt   . Hypertension Maternal Grandmother   . Cancer Other 29       Cervical, Malignant  . Depression Mother   . Hypertension Mother   . Cancer Maternal Uncle   . Cancer Paternal Aunt     Social History:  Social History   Socioeconomic History  . Marital status: Significant Other    Spouse name: Jamal  . Number of children: 2  . Years of education: Not on file  . Highest education level: Not on file  Occupational History  . Not on file  Social Needs  . Financial resource strain: Not on file  . Food insecurity    Worry: Never true    Inability: Never true  . Transportation needs    Medical: No    Non-medical: No  Tobacco Use  . Smoking status: Current Every Day Smoker    Years: 7.00    Types: Cigars  Last attempt to quit: 04/17/2018    Years since quitting: 1.0  . Smokeless tobacco: Never Used  Substance and Sexual Activity  . Alcohol use: Not Currently  . Drug use: No  . Sexual activity: Yes    Partners: Male    Birth control/protection: Condom, None  Lifestyle  . Physical activity    Days per week: 6 days    Minutes per session: Not on file  . Stress: Rather much  Relationships  . Social connections    Talks on phone: More than three times a week    Gets together: More than three times a week    Attends religious service: Never    Active member of club or organization: Yes    Attends meetings of clubs or organizations: Never    Relationship status:  Living with partner  . Intimate partner violence    Fear of current or ex partner: No    Emotionally abused: No    Physically abused: No    Forced sexual activity: No  Other Topics Concern  . Not on file  Social History Narrative  . Not on file    Allergies:  No Known Allergies  Medications: Prior to Admission medications   Medication Sig Start Date End Date Taking? Authorizing Provider  FLUoxetine (PROZAC) 20 MG capsule Take 1 capsule (20 mg total) by mouth daily. 04/27/19  Yes Dalia Heading, CNM  ibuprofen (ADVIL) 800 MG tablet Take 1 tablet (800 mg total) by mouth every 8 (eight) hours as needed. 02/20/19  Yes Carole Civil, MD  Physical Exam Vitals: BP 110/70   Pulse 85   Ht 5\' 4"  (1.626 m)   Wt 155 lb (70.3 kg)   LMP 04/16/2019 (Exact Date)   Breastfeeding No   BMI 26.61 kg/m  General: BF in NAD HEENT: normocephalic, anicteric Neck: No thyroid enlargement, no palpable nodules, no cervical lymphadenpathy Breast: no inflammation, no masses, nipples intact Pulmonary: No increased work of breathing, CTAB Heart: RRR without murmur Abdomen: Soft, non-tender, non-distended.  Umbilicus without lesions.  No hepatomegaly or masses palpable. No evidence of hernia. Genitourinary:  External: No lesions or inflammation    Vagina: Normal vaginal mucosa, no evidence of prolapse, white homogenous discharge   Cervix: Grossly normal in appearance, no bleeding  Uterus: Well involuted, mobile, non-tender  Adnexa: No adnexal masses, non-tender  Rectal: deferred Extremities: no edema, erythema, or tenderness Neurologic: Grossly intact Psychiatric: mood appropriate, affect full  Wet prep: positive for clue cells and amine odor, negative for hyphae and Trich  Assessment: 28 y.o. [id number] presenting for 4 month postpartum visit Depression-worsening postpartum  Improved since restarting prozac Bacterial vaginosis  Flagyl 500 mgm BID x 7 days (no alcohol)  Plan:  1)  Contraception:  Patient would like to use OCPS for contraception. No contraindication to pills. RX for Junel 24, with instructions for use. Advised to start on 15 November and use condoms x 2 weeks. Discussed what to do for missed pills. Possible side effects explained.   2)  Pap done - ASCCP guidelines and rational discussed.  Patient opts for every 3 years screening interval.  3) Depression: continue Prozac 20 mgm daily. Encouraged contacting [employer] and following up there.   4) Discussed return to normal activity, recommend continuing prenatal vitamins.  5) Follow up 6 weeks or sooner prn.  Dalia Heading, CNM  Dalia Heading, [place]

## 2019-04-28 ENCOUNTER — Encounter (HOSPITAL_COMMUNITY): Payer: Medicaid Other

## 2019-04-29 LAB — CYTOLOGY - PAP
Chlamydia: NEGATIVE
Comment: NEGATIVE
Comment: NORMAL
Diagnosis: NEGATIVE
Neisseria Gonorrhea: NEGATIVE

## 2019-05-02 ENCOUNTER — Encounter: Payer: Self-pay | Admitting: Certified Nurse Midwife

## 2019-05-02 LAB — POCT WET PREP (WET MOUNT): Trichomonas Wet Prep HPF POC: ABSENT

## 2019-05-03 ENCOUNTER — Encounter (HOSPITAL_COMMUNITY): Payer: Medicaid Other

## 2019-05-04 ENCOUNTER — Other Ambulatory Visit: Payer: Self-pay

## 2019-05-05 ENCOUNTER — Inpatient Hospital Stay: Payer: Medicaid Other

## 2019-05-05 ENCOUNTER — Encounter (HOSPITAL_COMMUNITY): Payer: Medicaid Other

## 2019-05-06 ENCOUNTER — Ambulatory Visit: Payer: Self-pay | Admitting: Licensed Clinical Social Worker

## 2019-05-06 ENCOUNTER — Telehealth: Payer: Self-pay | Admitting: Licensed Clinical Social Worker

## 2019-05-06 NOTE — Telephone Encounter (Signed)
Patient no showed for appt. LCSW called and left vm to follow up.

## 2019-05-07 ENCOUNTER — Telehealth: Payer: Medicaid Other | Admitting: Physician Assistant

## 2019-05-07 ENCOUNTER — Other Ambulatory Visit: Payer: Medicaid Other

## 2019-05-07 ENCOUNTER — Inpatient Hospital Stay: Payer: Medicaid Other | Admitting: Oncology

## 2019-05-07 DIAGNOSIS — A6000 Herpesviral infection of urogenital system, unspecified: Secondary | ICD-10-CM | POA: Diagnosis not present

## 2019-05-07 MED ORDER — LIDOCAINE HCL URETHRAL/MUCOSAL 2 % EX GEL: 1.0000 "application " | Freq: Two times a day (BID) | CUTANEOUS | 1 refills | Status: DC

## 2019-05-07 MED ORDER — VALACYCLOVIR HCL 500 MG PO TABS: 500.0000 mg | ORAL_TABLET | Freq: Two times a day (BID) | ORAL | 0 refills | Status: AC

## 2019-05-07 NOTE — Progress Notes (Signed)
We are sorry that you are not feeling well.  Here is how we plan to help!  Based on what you have shared ith me, it looks like you may ne having an outbreak/flare-up of genital herpes.  I have prescribed VALTREX 500 MG tablet: take 1 tablet by mouth twice daily for 3 days as well as apply topical Lidocaine jelly as needed up to twice daily for pain.    If you have been prescribed long term medications to be taken on a regular basis, it is important to follow the recommendations and take them as ordered.    Outbreaks usually include blisters and open sores in the genital area. Outbreaks that happen after the first time are usually not as severe and do not last as long. Genital Herpes Simplex is a commonly sexually transmitted viral infection that is found worldwide. Most of these genital infections are caused by one or two herpes simplex viruses that is passed from person to person during vaginal, oral, or anal sex. Sometimes, people do not know they have herpes because they do not have any symptoms.  Please be aware that if you have genital herpes you can be contagious even when you are not having rash or flare-up and you may not have any symptoms, even when you are taking suppressive medicines.  Herpes cannot be cured. The disease usually causes most problems during the first few years. After that, the virus is still there, but it causes few to no symptoms. Even when the virus is active, people with herpes can take medicines to reduce and help prevent symptoms.  Symptoms usually include blisters in the genital area. In women, this area includes the vagina, butt, anus, or thighs. In men, this area includes the penis, scrotum, anus, butt, or thighs. The blisters can become painful open sores, which then crust over as they heal. Sometimes, people can have other symptoms that include:  ?Blisters on the mouth or lips ?Fever, headache, or pain in the joints ?Trouble urinating  Outbreaks might occur every  month or more often, or just once or twice a year. Sometimes, people can tell when an outbreak will occur, because they feel itching or pain beforehand. Sometimes they do not know that an outbreak is coming because they have no symptoms. Whatever your pattern is, keep in mind that herpes outbreaks usually become less frequent over time as you get older. Certain things, called "triggers," can make outbreaks more likely to occur. These include stress, sunlight, menstrual periods,or getting sick.  Antiviral therapy can shorten the duration of symptoms and signs in primary infection, which, when untreated, can be associated with significant increase in the symptoms of the disease.  HOME CARE . Use a portable bath (such as a "Sitz bath") where you can sit in warm water for about 20 minutes. Your bathtub could also work. Avoid bubble baths.  . Keep the genital area clean and dry and avoid tight clothes.  . Take over-the-counter pain medicine such as acetaminophen (brand name: Tylenol) or ibuprofen sample brand names: Advil, Motrin). But avoid aspirin.  . Only take medications as instructed by your medical team.  . You are most likely to spread herpes to a sex partner when you have blisters and open sores on your body. But it's also possible to spread herpes to your partner when you do not have any symptoms. That is because herpes can be present on your body without causing any symptoms, like blisters or pain.  . Telling your  sex partner that you have herpes can be hard. But it can help protect them, since there are ways to lower the risk of spreading the infection.   . Using a condom every time you have sex  . Not having sex when you have symptoms  . Not having oral sex if you have blisters or open sores (in the genital area or around your mouth)  MAKE SURE YOU   . Understand these instructions. . Do not have sex without using a condom until you have been seen by a doctor and as instructed by the  provider . If you are not better or improved within 7 days, you MUST have a follow up at your doctor or the health department for evaluation. There are other causes of rashes in the genital region.  Thank you for choosing an e-visit. Your e-visit answers were reviewed by a board certified advanced clinical practitioner to complete your personal care plan. Depending upon the condition, your plan could have included both over the counter or prescription medications.  Please review your pharmacy choice. Make sure the pharmacy is open so you can pick up prescription now. If there is a problem, you may contact your provider through [employer] and have the prescription routed to another pharmacy.  Your safety is important to [place]. If you have drug allergies check your prescription carefully.   For the next 24 hours you can use MyChart to ask questions about today's visit, request a non-urgent call back, or ask for a work or school excuse. You will get an email in the next two days asking about your experience. I hope that your e-visit has been valuable and will speed your recovery     Greater than 5 minutes, yet less than 10 minutes of time have been spent researching, coordinating and implementing care for this patient today.

## 2019-05-10 ENCOUNTER — Ambulatory Visit: Payer: Medicaid Other | Admitting: Oncology

## 2019-05-11 ENCOUNTER — Telehealth: Payer: Self-pay | Admitting: Licensed Clinical Social Worker

## 2019-05-11 ENCOUNTER — Ambulatory Visit: Payer: Medicaid Other | Admitting: Licensed Clinical Social Worker

## 2019-05-11 NOTE — Telephone Encounter (Signed)
LCSW called patient to follow up regarding appointment and needing to sign consent. Patient voiced she would try to sign the consent this week. LCSW and patient agreed to be in touch regarding an appointment.

## 2019-05-17 NOTE — Telephone Encounter (Signed)
LCSW left follow up vm encouraging patient to call regarding consent and if she would like to schedule an appointment.

## 2019-05-24 DIAGNOSIS — R52 Pain, unspecified: Secondary | ICD-10-CM | POA: Diagnosis not present

## 2019-05-24 DIAGNOSIS — R05 Cough: Secondary | ICD-10-CM | POA: Diagnosis not present

## 2019-05-24 DIAGNOSIS — Z20828 Contact with and (suspected) exposure to other viral communicable diseases: Secondary | ICD-10-CM | POA: Diagnosis not present

## 2019-06-01 ENCOUNTER — Ambulatory Visit
Admission: EM | Admit: 2019-06-01 | Discharge: 2019-06-01 | Disposition: A | Discharge status: Home / Self Care | Payer: Medicaid Other | Attending: Emergency Medicine | Admitting: Emergency Medicine

## 2019-06-01 ENCOUNTER — Other Ambulatory Visit: Payer: Self-pay

## 2019-06-01 ENCOUNTER — Telehealth: Payer: Medicaid Other | Admitting: Physician Assistant

## 2019-06-01 ENCOUNTER — Encounter: Payer: Self-pay | Admitting: Emergency Medicine

## 2019-06-01 DIAGNOSIS — N898 Other specified noninflammatory disorders of vagina: Secondary | ICD-10-CM | POA: Diagnosis not present

## 2019-06-01 DIAGNOSIS — N390 Urinary tract infection, site not specified: Secondary | ICD-10-CM | POA: Diagnosis not present

## 2019-06-01 DIAGNOSIS — R1030 Lower abdominal pain, unspecified: Secondary | ICD-10-CM

## 2019-06-01 DIAGNOSIS — R399 Unspecified symptoms and signs involving the genitourinary system: Secondary | ICD-10-CM

## 2019-06-01 LAB — POCT URINALYSIS DIP (MANUAL ENTRY)
Bilirubin, UA: NEGATIVE
Glucose, UA: NEGATIVE mg/dL
Ketones, POC UA: NEGATIVE mg/dL
Nitrite, UA: NEGATIVE
Protein Ur, POC: 100 mg/dL — AB
Spec Grav, UA: 1.025 (ref 1.010–1.025)
Urobilinogen, UA: 0.2 E.U./dL
pH, UA: 7 (ref 5.0–8.0)

## 2019-06-01 MED ORDER — CEPHALEXIN 500 MG PO CAPS: 500.0000 mg | ORAL_CAPSULE | Freq: Two times a day (BID) | ORAL | 0 refills | Status: AC

## 2019-06-01 NOTE — ED Provider Notes (Signed)
Molly Graves    CSN: [id number] Arrival date & time: 06/01/19  1616      History   Chief Complaint Chief Complaint  Patient presents with  . Urinary Tract Infection    HPI Molly Graves is a 28 y.o. female.   Patient presents with 1 week history of dysuria, urinary frequency, and urgency.  She also reports a small amount of white vaginal discharge.  She denies fever, chills, lesions, rash, pelvic pain, abdominal pain, back pain, vaginal itching, or other symptoms.  No treatments attempted at home.  Patient states she tested positive for COVID on 05/18/2019.   The history is provided by the patient.    Past Medical History:  Diagnosis Date  . Anemia   . Depression    post partum depression  . Headache    MIGRAINES  . Herpes genitalia     Patient Active Problem List   Diagnosis Date Noted  . S/P ORIF (open reduction internal fixation) fracture IM nail left femur 02/18/2019 03/04/2019  . Femur fracture (HCC) 02/17/2019  . Closed fracture of femur, shaft (HCC) 02/17/2019  . Indication for care in labor and delivery, antepartum 12/25/2018  . Normal vaginal delivery 12/25/2018  . Postpartum care following vaginal delivery 12/25/2018  . Dysuria 12/13/2018  . Back pain affecting pregnancy in third trimester 11/17/2018  . MDD (major depressive disorder) 10/10/2018  . Severe recurrent major depression without psychotic features (HCC) 10/10/2018  . 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 Iron deficiency anemia 09/03/2018  . Pyelonephritis affecting pregnancy in second trimester 08/08/2018  . Supervision of other normal pregnancy, antepartum 05/22/2018  . Complication of intrauterine device (IUD) (HCC) 04/19/2016    Past Surgical History:  Procedure Laterality Date  . BREAST SURGERY     lumpectomy left breast  . DIAGNOSTIC LAPAROSCOPY  2015   to rule out uterine injury after D&E  . FEMUR IM NAIL Left 02/18/2019   Procedure: INTRAMEDULLARY  (IM) NAIL FEMORAL;  Surgeon: Vickki Hearing, MD;  Location: [facility];  Service: Orthopedics;  Laterality: Left;  . HYSTEROSCOPY W/D&C N/A 04/19/2016   Procedure: DILATATION AND CURETTAGE /HYSTEROSCOPY;  Surgeon: Nadara Mustard, MD;  Location: [facility];  Service: Gynecology;  Laterality: N/A;  . INDUCED ABORTION  2015  . IUD REMOVAL N/A 04/19/2016   Procedure: INTRAUTERINE DEVICE (IUD) REMOVAL;  Surgeon: Nadara Mustard, MD;  Location: [facility];  Service: Gynecology;  Laterality: N/A;  . LAPAROSCOPY N/A 04/19/2016   Procedure: LAPAROSCOPY DIAGNOSTIC;  Surgeon: Nadara Mustard, MD;  Location: [facility];  Service: Gynecology;  Laterality: N/A;    OB History    Gravida  5   Para  3   Term  3   Preterm  0   AB  2   Living  3     SAB  1   TAB  1   Ectopic  0   Multiple  0   Live Births  3            Home Medications    Prior to Admission medications   Medication Sig Start Date End Date Taking? Authorizing Provider  cephALEXin (KEFLEX) 500 MG capsule Take 1 capsule (500 mg total) by mouth 2 (two) times daily for 5 days. 06/01/19 06/06/19  Mickie Bail, NP  FLUoxetine (PROZAC) 20 MG capsule Take 1 capsule (20 mg total) by mouth daily. 04/27/19   Farrel Conners, CNM  ibuprofen (ADVIL) 800 MG tablet Take  1 tablet (800 mg total) by mouth every 8 (eight) hours as needed. 02/20/19   Carole Civil, MD  lidocaine (XYLOCAINE) 2 % jelly Apply 1 application topically 2 (two) times daily. As needed for pain 05/07/19   McVey, Gelene Mink, PA-C  Norethindrone Acetate-Ethinyl Estrad-FE (MICROGESTIN 24 FE) 1-20 MG-MCG(24) tablet Take 1 tablet by mouth daily. Start taking on 15 November and use condoms x 2 weeks. 04/27/19   Dalia Heading, CNM    Family History Family History  Problem Relation Age of Onset  . Hypertension Maternal Aunt   . Hypertension Maternal Grandmother   . Cancer Other 29       Cervical, Malignant  . Depression Mother   . Hypertension Mother    . Cancer Maternal Uncle   . Cancer Paternal Aunt     Social History Social History   Tobacco Use  . Smoking status: Current Every Day Smoker    Years: 7.00    Types: Cigars    Last attempt to quit: 04/17/2018    Years since quitting: 1.1  . Smokeless tobacco: Never Used  Substance Use Topics  . Alcohol use: Not Currently  . Drug use: No     Allergies   Patient has no known allergies.   Review of Systems Review of Systems  Constitutional: Negative for chills and fever.  HENT: Negative for ear pain and sore throat.   Eyes: Negative for pain and visual disturbance.  Respiratory: Negative for cough and shortness of breath.   Cardiovascular: Negative for chest pain and palpitations.  Gastrointestinal: Negative for abdominal pain and vomiting.  Genitourinary: Positive for dysuria, frequency, urgency and vaginal discharge. Negative for flank pain, hematuria and pelvic pain.  Musculoskeletal: Negative for arthralgias and back pain.  Skin: Negative for color change and rash.  Neurological: Negative for seizures and syncope.  All other systems reviewed and are negative.    Physical Exam Triage Vital Signs ED Triage Vitals  Enc Vitals Group     BP      Pulse      Resp      Temp      Temp src      SpO2      Weight      Height      Head Circumference      Peak Flow      Pain Score      Pain Loc      Pain Edu?      Excl. in [place]?    No data found.  Updated Vital Signs BP 110/70   Pulse 78   Temp 99.1 F (37.3 C) (Oral)   Resp 18   Wt 154 lb (69.9 kg)   LMP 05/11/2019   SpO2 96%   BMI 26.43 kg/m   Visual Acuity Right Eye Distance:   Left Eye Distance:   Bilateral Distance:    Right Eye Near:   Left Eye Near:    Bilateral Near:     Physical Exam Vitals and nursing note reviewed.  Constitutional:      General: She is not in acute distress.    Appearance: She is well-developed. She is not ill-appearing.  HENT:     Head: Normocephalic and  atraumatic.     Mouth/Throat:     Mouth: Mucous membranes are moist.     Pharynx: Oropharynx is clear.  Eyes:     Conjunctiva/sclera: Conjunctivae normal.  Cardiovascular:     Rate and Rhythm: Normal rate and  regular rhythm.     Heart sounds: No murmur.  Pulmonary:     Effort: Pulmonary effort is normal. No respiratory distress.     Breath sounds: Normal breath sounds.  Abdominal:     Palpations: Abdomen is soft.     Tenderness: There is no abdominal tenderness. There is no right CVA tenderness, left CVA tenderness, guarding or rebound.  Musculoskeletal:     Cervical back: Neck supple.  Skin:    General: Skin is warm and dry.  Neurological:     General: No focal deficit present.     Mental Status: She is alert and oriented to person, place, and time.  Psychiatric:        Mood and Affect: Mood normal.        Behavior: Behavior normal.      UC Treatments / Results  Labs (all labs ordered are listed, but only abnormal results are displayed) Labs Reviewed  POCT URINALYSIS DIP (MANUAL ENTRY) - Abnormal; Notable for the following components:      Result Value   Blood, UA small (*)    Protein Ur, POC =100 (*)    Leukocytes, UA Small (1+) (*)    All other components within normal limits  URINE CULTURE  CERVICOVAGINAL ANCILLARY ONLY    EKG   Radiology No results found.  Procedures Procedures (including critical care time)  Medications Ordered in UC Medications - No data to display  Initial Impression / Assessment and Plan / UC Course  I have reviewed the triage vital signs and the nursing notes.  Pertinent labs & imaging results that were available during my care of the patient were reviewed by me and considered in my medical decision making (see chart for details).    UTI. Vaginal discharge.  Urine culture pending.  Vaginal self swab obtained by patient.  Treating with Keflex.  Discussed with patient that we will call her if the urine culture shows a need to  change her antibiotic.  Discussed that we will call her if the STD tests are positive and that she may require additional treatment at that time.  Instructed patient to follow up with her PCP if her symptoms are not improving.  Patient agrees to plan of care.     Final Clinical Impressions(s) / UC Diagnoses   Final diagnoses:  Urinary tract infection without hematuria, site unspecified  Vaginal discharge     Discharge Instructions     Your urine shows signs of an infection.  We are sending your urine for a culture and will call you if the antibiotic needs to be changed.    Take the antibiotic as directed.    Your vaginal swab is being sent for testing.  We will call you if the test results are positive requiring treatment.    Follow-up with your primary care provider if your symptoms are not improving.        ED Prescriptions    Medication Sig Dispense Auth. Provider   cephALEXin (KEFLEX) 500 MG capsule Take 1 capsule (500 mg total) by mouth 2 (two) times daily for 5 days. 10 capsule Mickie Bailate, Priscila Bean H, NP     PDMP not reviewed this encounter.   Mickie Bailate, Winona Sison H, NP 06/01/19 1715

## 2019-06-01 NOTE — Discharge Instructions (Addendum)
Your urine shows signs of an infection.  We are sending your urine for a culture and will call you if the antibiotic needs to be changed.    Take the antibiotic as directed.    Your vaginal swab is being sent for testing.  We will call you if the test results are positive requiring treatment.    Follow-up with your primary care provider if your symptoms are not improving.

## 2019-06-01 NOTE — ED Triage Notes (Signed)
Patient in office today c/o urine issues freq,urgency and painful  STM:HDQQ

## 2019-06-01 NOTE — Progress Notes (Signed)
  Converted to a phone visit given several high risk symptoms including abdominal pain and vaginal discharge.  9:55 AM  Pt reports severe lower abdominal cramping like a menstrual cramp but menses isn't due yet.  She reports the pain kept her up most of the night.  She reports foul odor to her urine along with low back pain and urinary urgency.  She is unsure if this is similar to previous UTIs.  Pt reports she was pregnant at the time and is not pregnant now.  Pt reports the vaginal discharge is new in the last week and is malodorous.  Additionally, patient is Covid positive and has had abdominal pain for a week.  She denies diarrhea or vomiting.  She denies fevers or chills.  Given her collection of symptoms, severe pain and complicating factors of vaginal discharge and Covid 19, I believe she needs to be physically evaluated by a provider.  Long discussion with patient about correct location and she will be referred to urgent care.  Patient understands the importance of being seen to differentiate between UTI, pyelonephritis, pelvic inflammatory disease or Covid complications.   Based on what you shared with me and our phone call, I feel your condition warrants further evaluation and I recommend that you be seen for a face to face office visit.  It is important to be seen for further evaluation for treatment of urinary tract infection versus possible Covid complications versus possible pelvic infection.   NOTE: If you entered your credit card information for this eVisit, you will not be charged. You may see a "hold" on your card for the $35 but that hold will drop off and you will not have a charge processed.   If you are having a true medical emergency please call 911.      For an urgent face to face visit, [facility] has five urgent care centers for your convenience:      NEW:  [facility] [place] [facility] [place] Get Driving Directions [phone] [place]  [place], [place] [address] . 10 am - 6pm Monday - Friday    [facility]) Get Driving Directions [phone] [address] [place], [place] [address] . 10 am to 8 pm Monday-Friday . 12 pm to 8 pm [facility] Get Driving Directions [phone] [place], [place], [place] [address] . 8 am to 8 pm Monday-Friday . 9 am to 6 pm Saturday . 11 am to 6 pm Sunday     [facility] at [facility] Get Driving Directions  [phone] [address] [place], [place] [address] . 8 am to 8 pm Monday-Friday . 8 am to 4 pm [facility] at [place] Get Driving Directions [phone] [address] [place], [place] [address] . 12 pm to 6 pm Monday-Friday      Your e-visit answers were reviewed by a board certified advanced clinical practitioner to complete your personal care plan.  Thank you for using e-Visits.     Greater than 10 minutes, yet less than 20 minutes of time have been spent researching, coordinating, and implementing care for this patient today

## 2019-06-04 LAB — URINE CULTURE: Culture: 40000 — AB

## 2019-06-04 LAB — CERVICOVAGINAL ANCILLARY ONLY
Bacterial vaginitis: NEGATIVE
Candida vaginitis: NEGATIVE
Chlamydia: NEGATIVE
Neisseria Gonorrhea: NEGATIVE
Trichomonas: NEGATIVE

## 2019-06-28 ENCOUNTER — Ambulatory Visit: Payer: Medicaid Other | Admitting: Certified Nurse Midwife

## 2019-07-25 ENCOUNTER — Ambulatory Visit (INDEPENDENT_AMBULATORY_CARE_PROVIDER_SITE_OTHER)
Admission: RE | Admit: 2019-07-25 | Discharge: 2019-07-25 | Disposition: A | Discharge status: Home / Self Care | Payer: Medicaid Other | Source: Ambulatory Visit

## 2019-07-25 DIAGNOSIS — N760 Acute vaginitis: Secondary | ICD-10-CM

## 2019-07-25 DIAGNOSIS — B9689 Other specified bacterial agents as the cause of diseases classified elsewhere: Secondary | ICD-10-CM | POA: Diagnosis not present

## 2019-07-25 MED ORDER — METRONIDAZOLE 500 MG PO TABS: 500.0000 mg | ORAL_TABLET | Freq: Two times a day (BID) | ORAL | 0 refills | Status: AC

## 2019-07-25 NOTE — ED Provider Notes (Signed)
Virtual Visit via Video Note:  Molly Graves  initiated request for Telemedicine visit with [facility] team. I connected with Scotlynn Noyes Conigliaro  on 07/25/2019 at 12:10 PM  for a synchronized telemedicine visit using a video enabled HIPPA compliant telemedicine application. I verified that I am speaking with [name]  using two identifiers. [name], PA-C  was physically located in a [facility] Health Urgent care site and YAMARI VENTOLA was located at a different location.   The limitations of evaluation and management by telemedicine as well as the availability of in-person appointments were discussed. Patient was informed that she  may incur a bill ( including co-pay) for this virtual visit encounter. [name]  expressed understanding and gave verbal consent to proceed with virtual visit.     History of Present Illness:Molly Graves  is a 29 y.o. female presents for evaluation of possible of BV. recently stopped birth control due to irregular bleeding. Recently noticing odor after showering. Reports significant discharge- watery and whitish.  Mild itching yesterday. Has had some cramping but this has been persistent. Denies urinary symptoms. Has follow up planned for OBGYn on 2/19.   No Known Allergies   Past Medical History:  Diagnosis Date  . Anemia   . Depression    post partum depression  . Headache    MIGRAINES  . Herpes genitalia      Social History   Tobacco Use  . Smoking status: Current Every Day Smoker    Years: 7.00    Types: Cigars    Last attempt to quit: 04/17/2018    Years since quitting: 1.2  . Smokeless tobacco: Never Used  Substance Use Topics  . Alcohol use: Not Currently  . Drug use: No        Observations/Objective: Physical Exam  Constitutional: She is oriented to person, place, and time and well-developed, well-nourished, and in no distress. No distress.  HENT:  Head: Normocephalic and atraumatic.  Pulmonary/Chest: Effort  normal. No respiratory distress.  Speaking in full sentences  Neurological: She is alert and oriented to person, place, and time.  Speech clear, face symmetric     Assessment and Plan:    ICD-10-CM   1. Bacterial vaginosis  N76.0    B96.89     Flagyl BID x 1 week. No alcohol. Follow up in person if not improving.  Discussed strict return precautions. Patient verbalized understanding and is agreeable with plan.    Follow Up Instructions:     I discussed the assessment and treatment plan with the patient. The patient was provided an opportunity to ask questions and all were answered. The patient agreed with the plan and demonstrated an understanding of the instructions.   The patient was advised to call back or seek an in-person evaluation if the symptoms worsen or if the condition fails to improve as anticipated.      Lew Dawes, PA-C  07/25/2019 12:10 PM         Lew Dawes, PA-C 07/25/19 1211

## 2019-07-25 NOTE — Discharge Instructions (Signed)
Begin flagyl twice daily with food for 1 week. No alcohol while taking  Follow up if not getting better.

## 2019-07-27 DIAGNOSIS — Z131 Encounter for screening for diabetes mellitus: Secondary | ICD-10-CM | POA: Diagnosis not present

## 2019-07-27 DIAGNOSIS — Z8781 Personal history of (healed) traumatic fracture: Secondary | ICD-10-CM | POA: Diagnosis not present

## 2019-07-27 DIAGNOSIS — Z8619 Personal history of other infectious and parasitic diseases: Secondary | ICD-10-CM | POA: Diagnosis not present

## 2019-07-27 DIAGNOSIS — Z0000 Encounter for general adult medical examination without abnormal findings: Secondary | ICD-10-CM | POA: Diagnosis not present

## 2019-07-27 DIAGNOSIS — O99345 Other mental disorders complicating the puerperium: Secondary | ICD-10-CM | POA: Diagnosis not present

## 2019-07-27 DIAGNOSIS — Z13220 Encounter for screening for lipoid disorders: Secondary | ICD-10-CM | POA: Diagnosis not present

## 2019-08-04 LAB — TYPE AND SCREEN
ABO/RH(D): O POS
Antibody Screen: NEGATIVE
Unit division: 0
Unit division: 0

## 2019-08-04 LAB — BPAM RBC
Blood Product Expiration Date: 202010102359
Blood Product Expiration Date: 202010102359
ISSUE DATE / TIME: 202009060955
ISSUE DATE / TIME: 202009061350
Unit Type and Rh: 5100
Unit Type and Rh: 5100

## 2019-08-06 ENCOUNTER — Ambulatory Visit: Payer: Medicaid Other | Admitting: Certified Nurse Midwife

## 2019-08-14 ENCOUNTER — Other Ambulatory Visit: Payer: Self-pay | Admitting: Certified Nurse Midwife

## 2019-08-16 ENCOUNTER — Telehealth: Payer: Self-pay | Admitting: Certified Nurse Midwife

## 2019-08-16 NOTE — Telephone Encounter (Signed)
-----   Message from Farrel Conners, [place] sent at 08/15/2019 11:31 AM EST ----- Regarding: follow up appointment Can you please call patient and schedule a follow up appointment. Can be a telephone appointment. Thanks, [employer]

## 2019-08-16 NOTE — Telephone Encounter (Signed)
Patient scheduled to come into the office 3/12.

## 2019-08-17 DIAGNOSIS — H5213 Myopia, bilateral: Secondary | ICD-10-CM | POA: Diagnosis not present

## 2019-08-17 DIAGNOSIS — H52229 Regular astigmatism, unspecified eye: Secondary | ICD-10-CM | POA: Diagnosis not present

## 2019-08-27 ENCOUNTER — Ambulatory Visit (INDEPENDENT_AMBULATORY_CARE_PROVIDER_SITE_OTHER): Payer: Medicaid Other | Admitting: Certified Nurse Midwife

## 2019-08-27 ENCOUNTER — Other Ambulatory Visit: Payer: Self-pay

## 2019-08-27 ENCOUNTER — Encounter: Payer: Self-pay | Admitting: Certified Nurse Midwife

## 2019-08-27 VITALS — BP 110/70 | HR 96 | Temp 97.7°F | Ht 64.0 in | Wt 162.0 lb

## 2019-08-27 DIAGNOSIS — N764 Abscess of vulva: Secondary | ICD-10-CM | POA: Diagnosis not present

## 2019-08-27 DIAGNOSIS — F419 Anxiety disorder, unspecified: Secondary | ICD-10-CM

## 2019-08-27 DIAGNOSIS — F329 Major depressive disorder, single episode, unspecified: Secondary | ICD-10-CM | POA: Diagnosis not present

## 2019-08-27 DIAGNOSIS — F32A Depression, unspecified: Secondary | ICD-10-CM

## 2019-08-27 MED ORDER — FLUOXETINE HCL 20 MG PO CAPS: ORAL_CAPSULE | ORAL | 1 refills | Status: DC

## 2019-08-27 MED ORDER — CEPHALEXIN 500 MG PO CAPS: 500.0000 mg | ORAL_CAPSULE | Freq: Three times a day (TID) | ORAL | 0 refills | Status: DC

## 2019-08-27 NOTE — Progress Notes (Signed)
  History of Present Illness:  Molly Graves is a 29 y.o. G5 P3023 who was started on OCPs in November 2020 after presenting for her postpartum check up. Her postpartum course was complicated by depression and a fractured left leg. She had restarted her Prozac 20 mgm daily just prior to returning for her postpartum visit. She reports that she stopped taking her OCPs because she was not sexually active. She tried going off her Prozac again, and she reported a worsening of her anxiety off her Prozac. She has since restarted her Prozac and is feeling better. She is wanting to get a counselor/therapist as she had depression preceding her delivery and was admitted during her pregnancy for a suicide attempt.  She also had Covid 19 infection in December She has been dealing with 2 problems: 1) decreased appetite and weight loss. Is taking Apetamin to increase her appetite. Saw her PCP in February who did some chemistry labs and a TSH which were normal 2) Tender lump in her right mons and pain in her right groin for the past few days.     PMHx: She  has a past medical history of Anemia, Depression, Headache, and Herpes genitalia. Also,  has a past surgical history that includes Breast surgery; Diagnostic laparoscopy (2015); Induced abortion (2015); Hysteroscopy with D & C (N/A, 04/19/2016); laparoscopy (N/A, 04/19/2016); IUD removal (N/A, 04/19/2016); and Femur IM nail (Left, 02/18/2019)., family history includes Cancer in her maternal uncle and paternal aunt; Cancer (age of onset: 91) in an other family member; Depression in her mother; Hypertension in her maternal aunt, maternal grandmother, and mother.,  reports that she has been smoking cigars. She has smoked for the past 7.00 years. She has never used smokeless tobacco. She reports previous alcohol use. She reports that she does not use drugs.  She has a current medication list which includes the following prescription(s): fluoxetine, ibuprofen, hair skin & nails  advanced, multiple vitamins-minerals.  Also, has No Known Allergies.  ROS  Physical Exam:  BP 110/70   Pulse 96   Temp 97.7 F (36.5 C)   Ht 5\' 4"  (1.626 m)   Wt 162 lb (73.5 kg)   LMP 08/09/2019 (Exact Date)   BMI 27.81 kg/m  Body mass index is 27.81 kg/m. Constitutional: Well nourished, well developed female in no acute distress.  Abdomen: diffusely non tender to palpation, non distended, and no masses Pelvic exam: Vulva: 1.5cm firm intradermal mass on right side of mons. [place] LAN in right groin Neuro: Grossly intact Psych:  Normal mood and affect.    PHQ-9 score 3, is not suicidal GAD 7 score 4  Assessment: No longer taking OCPs Postpartum anxiety/depression: improved on Prozac Boil on right mons  Plan: Condoms for contraception if she is sexually active Continue Prozac 20 mgm daily. Refill sent to pharmacy. Can get refills from PCP or [facility]. Encouraged seeing therapist Keflex 500 mgm tid x 7 days/ warm soaks to mons RTO if boil unresolved and for annual  08/11/2019, Farrel Conners

## 2019-08-30 ENCOUNTER — Telehealth: Payer: Self-pay | Admitting: Licensed Clinical Social Worker

## 2019-08-30 NOTE — Telephone Encounter (Signed)
LCSW returned patient's call requesting appt. A vm was left.

## 2019-09-23 ENCOUNTER — Ambulatory Visit: Payer: Medicaid Other | Attending: Internal Medicine

## 2019-09-23 DIAGNOSIS — Z23 Encounter for immunization: Secondary | ICD-10-CM

## 2019-09-23 NOTE — Progress Notes (Signed)
   Covid-19 Vaccination Clinic  Name:  Molly Graves    MRN: [id number] DOB: [birth year]  09/23/2019  [name] was observed post Covid-19 immunization for 15 minutes without incident. She was provided with Vaccine Information Sheet and instruction to access the V-Safe system.   [name] was instructed to call 911 with any severe reactions post vaccine: Marland Kitchen Difficulty breathing  . Swelling of face and throat  . A fast heartbeat  . A bad rash all over body  . Dizziness and weakness   Immunizations Administered    Name Date Dose VIS Date Route   Pfizer COVID-19 Vaccine 09/23/2019 10:31 AM 0.3 mL 05/28/2019 Intramuscular   Manufacturer: [employer], [employer]   Lot: [id number]   NDC: [address]-3

## 2019-10-11 ENCOUNTER — Other Ambulatory Visit: Payer: Self-pay

## 2019-10-11 ENCOUNTER — Ambulatory Visit (INDEPENDENT_AMBULATORY_CARE_PROVIDER_SITE_OTHER): Payer: Medicaid Other | Admitting: Certified Nurse Midwife

## 2019-10-11 ENCOUNTER — Encounter: Payer: Self-pay | Admitting: Certified Nurse Midwife

## 2019-10-11 VITALS — BP 106/62 | HR 72 | Ht 64.0 in | Wt 166.0 lb

## 2019-10-11 DIAGNOSIS — R8290 Unspecified abnormal findings in urine: Secondary | ICD-10-CM | POA: Diagnosis not present

## 2019-10-11 DIAGNOSIS — N898 Other specified noninflammatory disorders of vagina: Secondary | ICD-10-CM

## 2019-10-11 DIAGNOSIS — R3915 Urgency of urination: Secondary | ICD-10-CM | POA: Diagnosis not present

## 2019-10-11 DIAGNOSIS — N9089 Other specified noninflammatory disorders of vulva and perineum: Secondary | ICD-10-CM | POA: Diagnosis not present

## 2019-10-11 DIAGNOSIS — N760 Acute vaginitis: Secondary | ICD-10-CM

## 2019-10-11 DIAGNOSIS — B9689 Other specified bacterial agents as the cause of diseases classified elsewhere: Secondary | ICD-10-CM

## 2019-10-11 MED ORDER — FLUCONAZOLE 150 MG PO TABS: ORAL_TABLET | ORAL | 0 refills | Status: DC

## 2019-10-11 MED ORDER — METRONIDAZOLE 500 MG PO TABS: 500.0000 mg | ORAL_TABLET | Freq: Two times a day (BID) | ORAL | 0 refills | Status: AC

## 2019-10-11 NOTE — Progress Notes (Signed)
Obstetrics & Gynecology Office Visit   Chief Complaint:  Chief Complaint  Patient presents with  . Follow-up    Doing well on prozac/Boil on vulva has resolved  . Gynecologic Exam    Experiencing urinary urgency w/o dysuria x a few years.    History of Present Illness: 29 year old G5 P3023, LMP 09/30/2019,  who presents with concerns regarding increased urinary urgency, urgency incontinence, and having a strong odor to her urine. Has had urinary frequency for years as well as some urgency, but the urgency has worsened over the last few weeks. She does drink little water and a lot of caffeinated products (12 oz coffee, 2-3 glasses or tea, 2-3 sodas).  She also states that her sexual partner recently said that she looked like there were cuts around her vaginal opening and accused her of fooling around with someone else.  Was treated for a boil on her mons last month with Keflex. This area has healed.    Review of Systems:  Review of Systems  Constitutional: Negative for chills and fever.  Gastrointestinal: Negative for abdominal pain.  Genitourinary: Positive for frequency and urgency. Negative for dysuria.       Urgency incontinence and bad odor to urine. +/-vaginal discharge     Past Medical History:  Past Medical History:  Diagnosis Date  . Anemia   . Back pain affecting pregnancy in third trimester 11/17/2018  . Complication of intrauterine device (IUD) ([facility]) 04/19/2016  . Depression    post partum depression  . Dysuria 12/13/2018  . Headache    MIGRAINES  . Herpes genitalia   . Normal vaginal delivery 12/25/2018  . Postpartum care following vaginal delivery 12/25/2018  . Pyelonephritis affecting pregnancy in second trimester 08/08/2018  . Supervision of other normal pregnancy, antepartum 05/22/2018   Clinic [facility] Prenatal Labs Dating [place] Blood type: O/Positive/-- (12/23 1605)  Genetic Screen declines Antibody:Negative (12/23 1605) Anatomic [place]  incomplete Rubella: 1.86 (12/23  1605) Varicella:   GTT Third trimester:  RPR: Non Reactive (12/23 1605)  Rhogam O+ HBsAg: Negative (12/23 1605)  TDaP vaccine              Flu Shot: HIV: Non Reactive (12/23 1605)  Baby Food                                  Past Surgical History:  Past Surgical History:  Procedure Laterality Date  . BREAST SURGERY     lumpectomy left breast  . DIAGNOSTIC LAPAROSCOPY  2015   to rule out uterine injury after D&E  . FEMUR IM NAIL Left 02/18/2019   Procedure: INTRAMEDULLARY (IM) NAIL FEMORAL;  Surgeon: Vickki Hearing, MD;  Location: [facility];  Service: Orthopedics;  Laterality: Left;  . HYSTEROSCOPY WITH D & C N/A 04/19/2016   Procedure: DILATATION AND CURETTAGE /HYSTEROSCOPY;  Surgeon: Nadara Mustard, MD;  Location: [facility];  Service: Gynecology;  Laterality: N/A;  . INDUCED ABORTION  2015  . IUD REMOVAL N/A 04/19/2016   Procedure: INTRAUTERINE DEVICE (IUD) REMOVAL;  Surgeon: Nadara Mustard, MD;  Location: [facility];  Service: Gynecology;  Laterality: N/A;  . LAPAROSCOPY N/A 04/19/2016   Procedure: LAPAROSCOPY DIAGNOSTIC;  Surgeon: Nadara Mustard, MD;  Location: [facility];  Service: Gynecology;  Laterality: N/A;    Gynecologic History: Patient's last menstrual period was 09/30/2019.  Obstetric History: [id number]  Family History:  Family History  Problem Relation Age of Onset  . Hypertension Maternal Aunt   . Hypertension Maternal Grandmother   . Cancer Other 29       Cervical, Malignant  . Depression Mother   . Hypertension Mother   . Cancer Maternal Uncle   . Cancer Paternal Aunt     Social History:  Social History   Socioeconomic History  . Marital status: Single    Spouse name: Not on file  . Number of children: 3  . Years of education: Not on file  . Highest education level: Not on file  Occupational History  . Not on file  Tobacco Use  . Smoking status: Current Every Day Smoker    Years: 7.00    Types: Cigars    Last attempt to quit: 04/17/2018    Years since  quitting: 1.5  . Smokeless tobacco: Never Used  Substance and Sexual Activity  . Alcohol use: Not Currently  . Drug use: No  . Sexual activity: Not Currently    Partners: Male    Birth control/protection: None  Other Topics Concern  . Not on file  Social History Narrative  . Not on file   Social Determinants of Health   Financial Resource Strain:   . Difficulty of Paying Living Expenses:   Food Insecurity: No Food Insecurity  . Worried About Charity fundraiser in the Last Year: Never true  . Ran Out of Food in the Last Year: Never true  Transportation Needs: No Transportation Needs  . Lack of Transportation (Medical): No  . Lack of Transportation (Non-Medical): No  Physical Activity:   . Days of Exercise per Week:   . Minutes of Exercise per Session:   Stress: Stress Concern Present  . Feeling of Stress : Rather much  Social Connections:   . Frequency of Communication with Friends and Family:   . Frequency of Social Gatherings with Friends and Family:   . Attends Religious Services:   . Active Member of Clubs or Organizations:   . Attends Archivist Meetings:   Marland Kitchen Marital Status:   Intimate Partner Violence:   . Fear of Current or Ex-Partner:   . Emotionally Abused:   Marland Kitchen Physically Abused:   . Sexually Abused:     Allergies:  No Known Allergies  Medications:  Current Outpatient Medications:  .  FLUoxetine (PROZAC) 20 MG capsule, TAKE 1 CAPSULE BY MOUTH EVERY DAY, Disp: 90 capsule, Rfl: 1 .  Multiple Vitamins-Minerals (HAIR SKIN & NAILS ADVANCED) TABS, Take 3 tablets by mouth daily., Disp: , Rfl:  .  Multiple Vitamins-Minerals (WOMENS MULTIVITAMIN PLUS PO), Take 1 tablet by mouth daily., Disp: , Rfl:   Physical Exam Vitals: BP 106/62 (BP Location: Right Arm, Patient Position: Sitting, Cuff Size: Normal)   Pulse 72   Ht 5\' 4"  (1.626 m)   Wt 75.3 kg   LMP 09/30/2019   BMI 28.49 kg/m    Patient's last menstrual period was 09/30/2019.  Physical Exam   Constitutional: She is oriented to person, place, and time. She appears well-developed and well-nourished. No distress.  Genitourinary:    Genitourinary Comments: External/BUS: no fissures, inflammation, lesions Vagina: small amount white homogenous discharge Cervix: no lesions    Neurological: She is alert and oriented to person, place, and time.  Psychiatric: She has a normal mood and affect.   Wet prep: positive for clue cells and negative for hyphae or trich  Urinalysis: trace leukocytes, trace protein, sp grav 1.025, ph 5, negative  for blood/ leukocytes/nitrite    Assessment: 29 y.o. [id number] with bacterial vaginosis Urinary urgency, bad odor to urine  R/O UTI  R/O STD  Plan: [facility] Urine culture, increase water intake Start Flagyl 500 mgm BID x 7 days (no alcohol) with food Diflucan 150 mgm every 3 days to prevent yeast infection. RTO prn.

## 2019-10-13 LAB — CHLAMYDIA/GONOCOCCUS/TRICHOMONAS, NAA
Chlamydia by NAA: NEGATIVE
Gonococcus by NAA: NEGATIVE
Trich vag by NAA: NEGATIVE

## 2019-10-14 LAB — URINE CULTURE

## 2019-10-15 MED ORDER — SULFAMETHOXAZOLE-TRIMETHOPRIM 800-160 MG PO TABS: 1.0000 | ORAL_TABLET | Freq: Two times a day (BID) | ORAL | 0 refills | Status: AC

## 2019-10-15 MED ORDER — FLUCONAZOLE 150 MG PO TABS: ORAL_TABLET | ORAL | 0 refills | Status: DC

## 2019-10-15 NOTE — Telephone Encounter (Signed)
PAtient called. Here urine culture returned positive for Proteus UTI. Is currently taking Flagyl for the BV and just started the Diflucan. Advised will also prescribe BactrimDS for the bladder infection. Can either wait to start the Bactrim until she has completed the Flagyl or start taking the Bactrim now. Recommend not taking the pills at the same time. Increase water intake. Refill on Diflucan-continue every 3 days while on antibiotics. Farrel Conners, CNM

## 2019-10-19 ENCOUNTER — Ambulatory Visit: Payer: Medicaid Other | Attending: Internal Medicine

## 2019-10-19 ENCOUNTER — Encounter: Payer: Self-pay | Admitting: Certified Nurse Midwife

## 2019-10-19 DIAGNOSIS — Z23 Encounter for immunization: Secondary | ICD-10-CM

## 2019-10-19 LAB — POCT URINALYSIS DIPSTICK (MANUAL)
Nitrite, UA: NEGATIVE
Poct Blood: NEGATIVE
Poct Glucose: NORMAL mg/dL
Poct Ketones: NEGATIVE
Spec Grav, UA: 1.025 (ref 1.010–1.025)
pH, UA: 5 (ref 5.0–8.0)

## 2019-10-19 LAB — POCT WET PREP (WET MOUNT): Trichomonas Wet Prep HPF POC: ABSENT

## 2019-10-19 NOTE — Progress Notes (Signed)
   Covid-19 Vaccination Clinic  Name:  JUDINE ARCINIEGA    MRN: [id number] DOB: Apr 28, 1991  10/19/2019  Ms. Statler was observed post Covid-19 immunization for 15 minutes without incident. She was provided with Vaccine Information Sheet and instruction to access the V-Safe system.   Ms. Zirkle was instructed to call 911 with any severe reactions post vaccine: Marland Kitchen Difficulty breathing  . Swelling of face and throat  . A fast heartbeat  . A bad rash all over body  . Dizziness and weakness   Immunizations Administered    Name Date Dose VIS Date Route   Pfizer COVID-19 Vaccine 10/19/2019  9:52 AM 0.3 mL 08/11/2018 Intramuscular   Manufacturer: [employer], [employer]   Lot: [id number]   NDC: [address]-3

## 2019-10-27 DIAGNOSIS — R399 Unspecified symptoms and signs involving the genitourinary system: Secondary | ICD-10-CM | POA: Diagnosis not present

## 2019-10-27 DIAGNOSIS — Z20822 Contact with and (suspected) exposure to covid-19: Secondary | ICD-10-CM | POA: Diagnosis not present

## 2019-10-27 DIAGNOSIS — R1084 Generalized abdominal pain: Secondary | ICD-10-CM | POA: Diagnosis not present

## 2019-10-27 DIAGNOSIS — Z03818 Encounter for observation for suspected exposure to other biological agents ruled out: Secondary | ICD-10-CM | POA: Diagnosis not present

## 2019-10-27 DIAGNOSIS — N10 Acute tubulo-interstitial nephritis: Secondary | ICD-10-CM | POA: Diagnosis not present

## 2019-10-27 DIAGNOSIS — Z3202 Encounter for pregnancy test, result negative: Secondary | ICD-10-CM | POA: Diagnosis not present

## 2019-12-07 ENCOUNTER — Encounter: Payer: Self-pay | Admitting: Certified Nurse Midwife

## 2019-12-07 ENCOUNTER — Ambulatory Visit (INDEPENDENT_AMBULATORY_CARE_PROVIDER_SITE_OTHER): Payer: Medicaid Other | Admitting: Certified Nurse Midwife

## 2019-12-07 ENCOUNTER — Other Ambulatory Visit: Payer: Self-pay

## 2019-12-07 ENCOUNTER — Other Ambulatory Visit: Payer: Self-pay | Admitting: Certified Nurse Midwife

## 2019-12-07 VITALS — BP 114/72 | HR 90 | Resp 16 | Ht 64.0 in | Wt 177.4 lb

## 2019-12-07 DIAGNOSIS — N390 Urinary tract infection, site not specified: Secondary | ICD-10-CM | POA: Diagnosis not present

## 2019-12-07 DIAGNOSIS — N912 Amenorrhea, unspecified: Secondary | ICD-10-CM

## 2019-12-07 LAB — POCT URINALYSIS DIPSTICK (MANUAL)
Leukocytes, UA: NEGATIVE
Nitrite, UA: NEGATIVE
Poct Bilirubin: NEGATIVE
Poct Blood: NEGATIVE
Poct Glucose: NORMAL mg/dL
Poct Ketones: NEGATIVE
Poct Protein: NEGATIVE mg/dL
Poct Urobilinogen: NORMAL mg/dL
Spec Grav, UA: 1.02 (ref 1.010–1.025)
pH, UA: 5 (ref 5.0–8.0)

## 2019-12-07 LAB — POCT URINE PREGNANCY: Preg Test, Ur: POSITIVE — AB

## 2019-12-07 MED ORDER — PROMETHAZINE HCL 12.5 MG PO TABS: 12.5000 mg | ORAL_TABLET | Freq: Four times a day (QID) | ORAL | 1 refills | Status: DC | PRN

## 2019-12-07 MED ORDER — DOXYLAMINE-PYRIDOXINE 10-10 MG PO TBEC: 2.0000 | DELAYED_RELEASE_TABLET | Freq: Every day | ORAL | 5 refills | Status: DC

## 2019-12-07 MED ORDER — PROMETHAZINE HCL 12.5 MG PO TABS
12.5000 mg | ORAL_TABLET | Freq: Four times a day (QID) | ORAL | 1 refills | Status: DC | PRN
Start: 2019-12-07 — End: 2019-12-07

## 2019-12-07 MED ORDER — GOODSENSE PRENATAL VITAMINS 28-0.8 MG PO TABS: 1.0000 | ORAL_TABLET | Freq: Every day | ORAL | 11 refills | Status: DC

## 2019-12-07 NOTE — Progress Notes (Signed)
Obstetrics & Gynecology Office Visit   Chief Complaint:  Chief Complaint  Patient presents with  . Gynecologic Exam    History of Present Illness: G a few days after complting6 P3023 with LMP 29 Oct 2019, presents for a follow up visit and for a confirmation of her pregnancy. She was seen the end of April and treated for BV with Flagyl and for a E.coli infection with Bactrim DS. She apparently developed symptoms of a pyelonephritis a few days after completing the Bactrim DS. She was seen in urgent care and treated with Omnicef. Urine culture was essentially negative. She has had no UTI symptoms since. She reports that she has had a positive UPT at home as of a few days ago. She complains of nausea x5 days. She contacted me earlier today for an antiemetic and a RX for phenergan tablets was sent to pharmacy. She has used Diclegis in the past and requests a prescription for this also. She also needs prenatal vitamins   Review of Systems:  ROS   Past Medical History:  Past Medical History:  Diagnosis Date  . Anemia   . Back pain affecting pregnancy in third trimester 11/17/2018  . Complication of intrauterine device (IUD) ([facility]) 04/19/2016  . Depression    post partum depression  . Dysuria 12/13/2018  . Headache    MIGRAINES  . Herpes genitalia   . Normal vaginal delivery 12/25/2018  . Postpartum care following vaginal delivery 12/25/2018  . Pyelonephritis affecting pregnancy in second trimester 08/08/2018  . Supervision of other normal pregnancy, antepartum 05/22/2018   Clinic [facility] Prenatal Labs Dating [place] Blood type: O/Positive/-- (12/23 1605)  Genetic Screen declines Antibody:Negative (12/23 1605) Anatomic [place]  incomplete Rubella: 1.86 (12/23 1605) Varicella:   GTT Third trimester:  RPR: Non Reactive (12/23 1605)  Rhogam O+ HBsAg: Negative (12/23 1605)  TDaP vaccine              Flu Shot: HIV: Non Reactive (12/23 1605)  Baby Food                                  Past Surgical History:    Past Surgical History:  Procedure Laterality Date  . BREAST SURGERY     lumpectomy left breast  . DIAGNOSTIC LAPAROSCOPY  2015   to rule out uterine injury after D&E  . FEMUR IM NAIL Left 02/18/2019   Procedure: INTRAMEDULLARY (IM) NAIL FEMORAL;  Surgeon: Carole Civil, MD;  Location: [facility];  Service: Orthopedics;  Laterality: Left;  . HYSTEROSCOPY WITH D & C N/A 04/19/2016   Procedure: DILATATION AND CURETTAGE /HYSTEROSCOPY;  Surgeon: Gae Dry, MD;  Location: [facility];  Service: Gynecology;  Laterality: N/A;  . INDUCED ABORTION  2015  . IUD REMOVAL N/A 04/19/2016   Procedure: INTRAUTERINE DEVICE (IUD) REMOVAL;  Surgeon: Gae Dry, MD;  Location: [facility];  Service: Gynecology;  Laterality: N/A;  . LAPAROSCOPY N/A 04/19/2016   Procedure: LAPAROSCOPY DIAGNOSTIC;  Surgeon: Gae Dry, MD;  Location: [facility];  Service: Gynecology;  Laterality: N/A;    Gynecologic History: Patient's last menstrual period was 10/29/2019.  Obstetric History: [id number]  Family History:  Family History  Problem Relation Age of Onset  . Hypertension Maternal Aunt   . Hypertension Maternal Grandmother   . Cancer Other 29       Cervical, Malignant  . Depression Mother   . Hypertension Mother   .  Cancer Maternal Uncle   . Cancer Paternal Aunt     Social History:  Social History   Socioeconomic History  . Marital status: Single    Spouse name: Not on file  . Number of children: 3  . Years of education: Not on file  . Highest education level: Not on file  Occupational History  . Not on file  Tobacco Use  . Smoking status: Current Every Day Smoker    Years: 7.00    Types: Cigars    Last attempt to quit: 04/17/2018    Years since quitting: 1.6  . Smokeless tobacco: Never Used  Vaping Use  . Vaping Use: Former  Substance and Sexual Activity  . Alcohol use: Not Currently  . Drug use: No  . Sexual activity: Not Currently    Partners: Male    Birth control/protection: None   Other Topics Concern  . Not on file  Social History Narrative  . Not on file   Social Determinants of Health   Financial Resource Strain:   . Difficulty of Paying Living Expenses:   Food Insecurity: No Food Insecurity  . Worried About Programme researcher, broadcasting/film/video in the Last Year: Never true  . Ran Out of Food in the Last Year: Never true  Transportation Needs: No Transportation Needs  . Lack of Transportation (Medical): No  . Lack of Transportation (Non-Medical): No  Physical Activity:   . Days of Exercise per Week:   . Minutes of Exercise per Session:   Stress: Stress Concern Present  . Feeling of Stress : Rather much  Social Connections:   . Frequency of Communication with Friends and Family:   . Frequency of Social Gatherings with Friends and Family:   . Attends Religious Services:   . Active Member of Clubs or Organizations:   . Attends Banker Meetings:   Marland Kitchen Marital Status:   Intimate Partner Violence:   . Fear of Current or Ex-Partner:   . Emotionally Abused:   Marland Kitchen Physically Abused:   . Sexually Abused:     Allergies:  No Known Allergies  Medications: Prior to Admission medications   Medication Sig Start Date End Date Taking? Authorizing Provider  promethazine (PHENERGAN) 12.5 MG tablet Take 1 tablet (12.5 mg total) by mouth every 6 (six) hours as needed for nausea or vomiting. 12/07/19  Yes Farrel Conners, CNM         FLUoxetine (PROZAC) 20 MG capsule TAKE 1 CAPSULE BY MOUTH EVERY DAY 08/27/19   Farrel Conners, CNM           Physical Exam Vitals: BP 114/72   Pulse 90   Resp 16   Ht 5\' 4"  (1.626 m)   Wt 177 lb 6.4 oz (80.5 kg)   LMP 10/29/2019   SpO2 99%   BMI 30.45 kg/m      Physical Exam  Constitutional: She is oriented to person, place, and time. No distress.  Neurological: She is alert and oriented to person, place, and time.  Skin: Skin is warm and dry.  Psychiatric: Mood normal.   Results for orders placed or performed in visit  on 12/07/19 (from the past 24 hour(s))  POCT urine pregnancy     Status: Abnormal   Collection Time: 12/07/19  4:34 PM  Result Value Ref Range   Preg Test, Ur Positive (A) Negative  POCT Urinalysis Dip Manual     Status: None   Collection Time: 12/07/19  4:41 PM  Result Value Ref  Range   Spec Grav, UA 1.020 1.010 - 1.025   pH, UA 5.0 5.0 - 8.0   Leukocytes, UA Negative Negative   Nitrite, UA Negative Negative   Poct Protein Negative Negative, trace mg/dL   Poct Glucose Normal Normal mg/dL   Poct Ketones Negative Negative   Poct Urobilinogen Normal Normal mg/dL   Poct Bilirubin Negative Negative   Poct Blood Negative Negative, trace    Assessment: 29 y.o. [id number] with no evidence of UTI Pregnancy confirmed  Plan: Diclegis #100 tablets RF x5-reviewed how to take  Prenatal vitamins #30 RF x11 (also given some samples RTO for NOB  Farrel Conners, CNM

## 2019-12-17 ENCOUNTER — Encounter: Payer: Medicaid Other | Admitting: Obstetrics

## 2020-01-31 ENCOUNTER — Encounter: Payer: Self-pay | Admitting: Obstetrics and Gynecology

## 2020-01-31 ENCOUNTER — Ambulatory Visit (INDEPENDENT_AMBULATORY_CARE_PROVIDER_SITE_OTHER): Payer: Medicaid Other | Admitting: Obstetrics and Gynecology

## 2020-01-31 ENCOUNTER — Other Ambulatory Visit: Payer: Self-pay

## 2020-01-31 VITALS — BP 120/72 | Ht 63.0 in | Wt 186.0 lb

## 2020-01-31 DIAGNOSIS — Z130 Encounter for screening for diseases of the blood and blood-forming organs and certain disorders involving the immune mechanism: Secondary | ICD-10-CM

## 2020-01-31 DIAGNOSIS — N939 Abnormal uterine and vaginal bleeding, unspecified: Secondary | ICD-10-CM

## 2020-01-31 DIAGNOSIS — R519 Headache, unspecified: Secondary | ICD-10-CM | POA: Diagnosis not present

## 2020-01-31 NOTE — Progress Notes (Signed)
Patient ID: Molly Graves, female   DOB: [birth year], 29 y.o.   MRN: [id number]  Reason for Consult: Gynecologic Exam (vaginal bleeding off and on since 12/16/2019 to present.)   Referred by Nira Retort  Subjective:     HPI:  Molly Graves is a 29 y.o. female. She reports that she had a termination on 12/16/2019. She took medication and passed products of conception at home.  She had 4 days of heavy bleeding and has continued spotting since that time. She denies pelvic pain or fever. She has not been sexually active, She is using OCPs.  Additionally she reports 2 weeks of headaches, frontal, worse with light and noise. She often wakes up with the headaches. She has some improvement with ibuprofen. She does have stress at home. She has a history of occasional headaches, but these are happening 3-4 times a week.     Past Medical History:  Diagnosis Date   Anemia    Back pain affecting pregnancy in third trimester 11/17/2018   Complication of intrauterine device (IUD) (HCC) 04/19/2016   Depression    post partum depression   Dysuria 12/13/2018   Headache    MIGRAINES   Herpes genitalia    Normal vaginal delivery 12/25/2018   Postpartum care following vaginal delivery 12/25/2018   Pyelonephritis affecting pregnancy in second trimester 08/08/2018   Supervision of other normal pregnancy, antepartum 05/22/2018   [facility]inic [facility] Prenatal Labs Dating [place] Blood type: O/Positive/-- (12/23 1605)  Genetic Screen declines Antibody:Negative (12/23 1605) Anatomic [place]  incomplete Rubella: 1.86 (12/23 1605) Varicella:   GTT Third trimester:  RPR: Non Reactive (12/23 1605)  Rhogam O+ HBsAg: Negative (12/23 1605)  TDaP vaccine              Flu Shot: HIV: Non Reactive (12/23 1605)  Baby Food                                 Family History  Problem Relation Age of Onset   Hypertension Maternal Aunt    Hypertension Maternal Grandmother    Cancer Other 29       Cervical, Malignant    Depression Mother    Hypertension Mother    Cancer Maternal Uncle    Cancer Paternal Aunt    Past Surgical History:  Procedure Laterality Date   BREAST SURGERY     lumpectomy left breast   DIAGNOSTIC LAPAROSCOPY  2015   to rule out uterine injury after D&E   FEMUR IM NAIL Left 02/18/2019   Procedure: INTRAMEDULLARY (IM) NAIL FEMORAL;  Surgeon: Vickki Hearing, MD;  Location: [facility];  Service: Orthopedics;  Laterality: Left;   HYSTEROSCOPY WITH D & C N/A 04/19/2016   Procedure: DILATATION AND CURETTAGE /HYSTEROSCOPY;  Surgeon: Nadara Mustard, MD;  Location: [facility];  Service: Gynecology;  Laterality: N/A;   INDUCED ABORTION  2015   IUD REMOVAL N/A 04/19/2016   Procedure: INTRAUTERINE DEVICE (IUD) REMOVAL;  Surgeon: Nadara Mustard, MD;  Location: [facility];  Service: Gynecology;  Laterality: N/A;   LAPAROSCOPY N/A 04/19/2016   Procedure: LAPAROSCOPY DIAGNOSTIC;  Surgeon: Nadara Mustard, MD;  Location: [facility];  Service: Gynecology;  Laterality: N/A;    Short Social History:  Social History   Tobacco Use   Smoking status: Current Every Day Smoker    Years: 7.00    Types: Cigars    Last attempt to quit: 04/17/2018  Years since quitting: 1.7   Smokeless tobacco: Never Used  Substance Use Topics   Alcohol use: Not Currently    No Known Allergies  Current Outpatient Medications  Medication Sig Dispense Refill   Doxylamine-Pyridoxine (DICLEGIS) 10-10 MG TBEC Take 2 tablets by mouth at bedtime. If symptoms persist, add one tablet in the morning and one in the afternoon 100 tablet 5   FLUoxetine (PROZAC) 20 MG capsule TAKE 1 CAPSULE BY MOUTH EVERY DAY 90 capsule 1   JUNEL 1/20 1-20 MG-MCG tablet Take 1 tablet by mouth daily.     Prenatal Vit-Fe Fumarate-FA (GOODSENSE PRENATAL VITAMINS) 28-0.8 MG TABS Take 1 tablet by mouth daily. 30 tablet 11   promethazine (PHENERGAN) 12.5 MG tablet Take 1 tablet (12.5 mg total) by mouth every 6 (six) hours as needed for  nausea or vomiting. 30 tablet 1   No current facility-administered medications for this visit.    Review of Systems  Constitutional: Negative for chills, fatigue, fever and unexpected weight change.  HENT: Negative for trouble swallowing.  Eyes: Negative for loss of vision.  Respiratory: Negative for cough, shortness of breath and wheezing.  Cardiovascular: Negative for chest pain, leg swelling, palpitations and syncope.  GI: Negative for abdominal pain, blood in stool, diarrhea, nausea and vomiting.  GU: Negative for difficulty urinating, dysuria, frequency and hematuria.  Musculoskeletal: Negative for back pain, leg pain and joint pain.  Skin: Negative for rash.  Neurological: Negative for dizziness, headaches, light-headedness, numbness and seizures.  Psychiatric: Negative for behavioral problem, confusion, depressed mood and sleep disturbance.        Objective:  Objective   Vitals:   01/31/20 0818  BP: 120/72  Weight: 186 lb (84.4 kg)  Height: 5\' 3"  (1.6 m)   Body mass index is 32.95 kg/m.  Physical Exam Vitals and nursing note reviewed.  Constitutional:      Appearance: She is well-developed.  HENT:     Head: Normocephalic and atraumatic.  Eyes:     Pupils: Pupils are equal, round, and reactive to light.  Cardiovascular:     Rate and Rhythm: Normal rate and regular rhythm.  Pulmonary:     Effort: Pulmonary effort is normal. No respiratory distress.  Skin:    General: Skin is warm and dry.  Neurological:     Mental Status: She is alert and oriented to person, place, and time.  Psychiatric:        Behavior: Behavior normal.        Thought Content: Thought content normal.        Judgment: Judgment normal.        Assessment/Plan:     29 yo with AUB following a medical termination, Will follow up for 37 and nuswab for infection Will check CBC, ferritin, b12 today Discussed supplements for migraine prevention. Encouraged hydration and stress mitigation.  Provided with information. If headaches persist can consider migraine medication and/or referral.     More than 20 minutes were spent face to face with the patient in the room, reviewing the medical record, labs and images, and coordinating care for the patient. The plan of management was discussed in detail and counseling was provided.   [place] MD [facility], [facility] 01/31/2020 8:41 AM

## 2020-01-31 NOTE — Patient Instructions (Addendum)
Magnesium 500 mg daily B2 400 mg daily Coenzyme q10 150 mg daily Ferrous Sulfate 65 mg elemental iron daily   Tension Headache, Adult A tension headache is a feeling of pain, pressure, or aching in the head that is often felt over the front and sides of the head. The pain can be dull, or it can feel tight (constricting). There are two types of tension headache:  Episodic tension headache. This is when the headaches happen fewer than 15 days a month.  Chronic tension headache. This is when the headaches happen more than 15 days a month during a 8-month period. A tension headache can last from 30 minutes to several days. It is the most common kind of headache. Tension headaches are not normally associated with nausea or vomiting, and they do not get worse with physical activity. What are the causes? The exact cause of this condition is not known. Tension headaches are often triggered by stress, anxiety, or depression. Other triggers include:  Alcohol.  Too much caffeine or caffeine withdrawal.  Respiratory infections, such as colds, flu, or sinus infections.  Dental problems or teeth clenching.  Tiredness (fatigue).  Holding your head and neck in the same position for a long period of time, such as while using a computer.  Smoking.  Arthritis of the neck. What are the signs or symptoms? Symptoms of this condition include:  A feeling of pressure or tightness around the head.  Dull, aching head pain.  Pain over the front and sides of the head.  Tenderness in the muscles of the head, neck, and shoulders. How is this diagnosed? This condition may be diagnosed based on your symptoms, your medical history, and a physical exam. If your symptoms are severe or unusual, you may have imaging tests, such as a CT scan or an MRI of your head. Your vision may also be checked. How is this treated? This condition may be treated with lifestyle changes and with medicines that help relieve  symptoms. Follow these instructions at home: Managing pain  Take over-the-counter and prescription medicines only as told by your health care provider.  When you have a headache, lie down in a dark, quiet room.  If directed, apply ice to the head and neck: ? Put ice in a plastic bag. ? Place a towel between your skin and the bag. ? Leave the ice on for 20 minutes, 2-3 times a day.  If directed, apply heat to the back of your neck as often as told by your health care provider. Use the heat source that your health care provider recommends, such as a moist heat pack or a heating pad. ? Place a towel between your skin and the heat source. ? Leave the heat on for 20-30 minutes. ? Remove the heat if your skin turns bright red. This is especially important if you are unable to feel pain, heat, or cold. You may have a greater risk of getting burned. Eating and drinking  Eat meals on a regular schedule.  Limit alcohol intake to no more than 1 drink a day for nonpregnant women and 2 drinks a day for men. One drink equals 12 oz of beer, 5 oz of wine, or 1 oz of hard liquor.  Drink enough fluid to keep your urine pale yellow.  Decrease your caffeine intake, or stop using caffeine. Lifestyle  Get 7-9 hours of sleep each night, or get the amount of sleep recommended by your health care provider.  At bedtime, remove  all electronic devices from your room. Electronic devices include computers, phones, and tablets.  Find ways to manage your stress. Some things that can help relieve stress include: ? Exercise. ? Deep breathing exercises. ? Yoga. ? Listening to music. ? Positive mental imagery.  Try to sit up straight and avoid tensing your muscles.  Do not use any products that contain nicotine or tobacco, such as cigarettes and e-cigarettes. If you need help quitting, ask your health care provider. General instructions   Keep all follow-up visits as told by your health care provider. This  is important.  Avoid any headache triggers. Keep a headache journal to help find out what may trigger your headaches. For example, write down: ? What you eat and drink. ? How much sleep you get. ? Any change to your diet or medicines. Contact a health care provider if:  Your headache does not get better.  Your headache comes back.  You are sensitive to sounds, light, or smells because of a headache.  You have nausea or you vomit.  Your stomach hurts. Get help right away if:  You suddenly develop a very severe headache along with any of the following: ? A stiff neck. ? Nausea and vomiting. ? Confusion. ? Weakness. ? Double vision or loss of vision. ? Shortness of breath. ? Rash. ? Unusual sleepiness. ? Fever. ? Trouble speaking. ? Pain in your eyes or ears. ? Trouble walking or balancing. ? Feeling faint or passing out. Summary  A tension headache is a feeling of pain, pressure, or aching in the head that is often felt over the front and sides of the head.  A tension headache can last from 30 minutes to several days. It is the most common kind of headache.  This condition may be diagnosed based on your symptoms, your medical history, and a physical exam.  This condition may be treated with lifestyle changes and with medicines that help relieve symptoms. This information is not intended to replace advice given to you by your health care provider. Make sure you discuss any questions you have with your health care provider. Document Revised: 03/31/2019 Document Reviewed: 09/13/2016 Elsevier Patient Education  2020 [employer] Inc.  General Headache Without Cause A headache is pain or discomfort felt around the head or neck area. The specific cause of a headache may not be found. There are many causes and types of headaches. A few common ones are:  Tension headaches.  Migraine headaches.  Cluster headaches.  Chronic daily headaches. Follow these instructions at  home: Watch your condition for any changes. Let your health care provider know about them. Take these steps to help with your condition: Managing pain      Take over-the-counter and prescription medicines only as told by your health care provider.  Lie down in a dark, quiet room when you have a headache.  If directed, put ice on your head and neck area: ? Put ice in a plastic bag. ? Place a towel between your skin and the bag. ? Leave the ice on for 20 minutes, 2-3 times per day.  If directed, apply heat to the affected area. Use the heat source that your health care provider recommends, such as a moist heat pack or a heating pad. ? Place a towel between your skin and the heat source. ? Leave the heat on for 20-30 minutes. ? Remove the heat if your skin turns bright red. This is especially important if you are unable to feel  pain, heat, or cold. You may have a greater risk of getting burned.  Keep lights dim if bright lights bother you or make your headaches worse. Eating and drinking  Eat meals on a regular schedule.  If you drink alcohol: ? Limit how much you use to:  0-1 drink a day for women.  0-2 drinks a day for men. ? Be aware of how much alcohol is in your drink. In the [place] one drink equals one 12 oz bottle of beer (355 mL), one 5 oz glass of wine (148 mL), or one 1 oz glass of hard liquor (44 mL).  Stop drinking caffeine, or decrease the amount of caffeine you drink. General instructions   Keep a headache journal to help find out what may trigger your headaches. For example, write down: ? What you eat and drink. ? How much sleep you get. ? Any change to your diet or medicines.  Try massage or other relaxation techniques.  Limit stress.  Sit up straight, and do not tense your muscles.  Do not use any products that contain nicotine or tobacco, such as cigarettes, e-cigarettes, and chewing tobacco. If you need help quitting, ask your health care  provider.  Exercise regularly as told by your health care provider.  Sleep on a regular schedule. Get 7-9 hours of sleep each night, or the amount recommended by your health care provider.  Keep all follow-up visits as told by your health care provider. This is important. Contact a health care provider if:  Your symptoms are not helped by medicine.  You have a headache that is different from the usual headache.  You have nausea or you vomit.  You have a fever. Get help right away if:  Your headache becomes severe quickly.  Your headache gets worse after moderate to intense physical activity.  You have repeated vomiting.  You have a stiff neck.  You have a loss of vision.  You have problems with speech.  You have pain in the eye or ear.  You have muscular weakness or loss of muscle control.  You lose your balance or have trouble walking.  You feel faint or pass out.  You have confusion.  You have a seizure. Summary  A headache is pain or discomfort felt around the head or neck area.  There are many causes and types of headaches. In some cases, the cause may not be found.  Keep a headache journal to help find out what may trigger your headaches. Watch your condition for any changes. Let your health care provider know about them.  Contact a health care provider if you have a headache that is different from the usual headache, or if your symptoms are not helped by medicine.  Get help right away if your headache becomes severe, you vomit, you have a loss of vision, you lose your balance, or you have a seizure. This information is not intended to replace advice given to you by your health care provider. Make sure you discuss any questions you have with your health care provider. Document Revised: 12/22/2017 Document Reviewed: 12/22/2017 Elsevier Patient Education  2020 [employer].

## 2020-02-01 LAB — CBC
Hematocrit: 38.7 % (ref 34.0–46.6)
Hemoglobin: 12.8 g/dL (ref 11.1–15.9)
MCH: 28.4 pg (ref 26.6–33.0)
MCHC: 33.1 g/dL (ref 31.5–35.7)
MCV: 86 fL (ref 79–97)
Platelets: 327 10*3/uL (ref 150–450)
RBC: 4.51 x10E6/uL (ref 3.77–5.28)
RDW: 12.6 % (ref 11.7–15.4)
WBC: 7.3 10*3/uL (ref 3.4–10.8)

## 2020-02-01 LAB — VITAMIN B12: Vitamin B-12: 282 pg/mL (ref 232–1245)

## 2020-02-01 LAB — FERRITIN: Ferritin: 23 ng/mL (ref 15–150)

## 2020-02-08 ENCOUNTER — Telehealth: Payer: Self-pay | Admitting: Obstetrics and Gynecology

## 2020-02-08 ENCOUNTER — Ambulatory Visit (INDEPENDENT_AMBULATORY_CARE_PROVIDER_SITE_OTHER): Payer: Medicaid Other

## 2020-02-08 ENCOUNTER — Ambulatory Visit (INDEPENDENT_AMBULATORY_CARE_PROVIDER_SITE_OTHER): Payer: Medicaid Other | Admitting: Obstetrics and Gynecology

## 2020-02-08 ENCOUNTER — Other Ambulatory Visit: Payer: Self-pay

## 2020-02-08 VITALS — BP 124/70 | Ht 63.0 in | Wt 182.6 lb

## 2020-02-08 DIAGNOSIS — O034 Incomplete spontaneous abortion without complication: Secondary | ICD-10-CM | POA: Diagnosis not present

## 2020-02-08 DIAGNOSIS — N939 Abnormal uterine and vaginal bleeding, unspecified: Secondary | ICD-10-CM

## 2020-02-08 NOTE — Telephone Encounter (Signed)
LM for pt to rtn call. 

## 2020-02-08 NOTE — H&P (View-Only) (Signed)
 Patient ID: Molly Graves, female   DOB: 05/20/1991, 29 y.o.   MRN: [id number]  Reason for Consult: Gynecologic Exam ([place])   Referred by [facility]  Subjective:     HPI:  Molly Graves is a 29 y.o. female female. She reports that she had a termination on 12/16/2019. She took medication and passed products of conception at home.  She had 4 days of heavy bleeding and has continued spotting since that time. She denies pelvic pain or fever. She has not been sexually active, She is using OCPs.    Past Medical History:  Diagnosis Date  . Anemia   . Back pain affecting pregnancy in third trimester 11/17/2018  . Complication of intrauterine device (IUD) (HCC) 04/19/2016  . Depression    post partum depression  . Dysuria 12/13/2018  . Headache    MIGRAINES  . Herpes genitalia   . Normal vaginal delivery 12/25/2018  . Postpartum care following vaginal delivery 12/25/2018  . Pyelonephritis affecting pregnancy in second trimester 08/08/2018  . Supervision of other normal pregnancy, antepartum 05/22/2018   [facility]inic [facility] Prenatal Labs Dating [place] Blood type: O/Positive/-- (12/23 1605)  Genetic Screen declines Antibody:Negative (12/23 1605) Anatomic US  incomplete Rubella: 1.86 (12/23 1605) Varicella:   GTT Third trimester:  RPR: Non Reactive (12/23 1605)  Rhogam O+ HBsAg: Negative (12/23 1605)  TDaP vaccine              Flu Shot: HIV: Non Reactive (12/23 1605)  Baby Food                                 Family History  Problem Relation Age of Onset  . Hypertension Maternal Aunt   . Hypertension Maternal Grandmother   . Cancer Other 29       Cervical, Malignant  . Depression Mother   . Hypertension Mother   . Cancer Maternal Uncle   . Cancer Paternal Aunt    Past Surgical History:  Procedure Laterality Date  . BREAST SURGERY     lumpectomy left breast  . DIAGNOSTIC LAPAROSCOPY  2015   to rule out uterine injury after D&E  . FEMUR IM NAIL Left 02/18/2019   Procedure: INTRAMEDULLARY  (IM) NAIL FEMORAL;  Surgeon: Harrison, Stanley E, MD;  Location: [facility];  Service: Orthopedics;  Laterality: Left;  . HYSTEROSCOPY WITH D & C N/A 04/19/2016   Procedure: DILATATION AND CURETTAGE /HYSTEROSCOPY;  Surgeon: Robert P Harris, MD;  Location: [facility];  Service: Gynecology;  Laterality: N/A;  . INDUCED ABORTION  2015  . IUD REMOVAL N/A 04/19/2016   Procedure: INTRAUTERINE DEVICE (IUD) REMOVAL;  Surgeon: Robert P Harris, MD;  Location: [facility];  Service: Gynecology;  Laterality: N/A;  . LAPAROSCOPY N/A 04/19/2016   Procedure: LAPAROSCOPY DIAGNOSTIC;  Surgeon: Robert P Harris, MD;  Location: [facility];  Service: Gynecology;  Laterality: N/A;    Short Social History:  Social History   Tobacco Use  . Smoking status: Current Every Day Smoker    Years: 7.00    Types: Cigars    Last attempt to quit: 04/17/2018    Years since quitting: 1.8  . Smokeless tobacco: Never Used  Substance Use Topics  . Alcohol use: Not Currently    No Known Allergies  Current Outpatient Medications  Medication Sig Dispense Refill  . ELDERBERRY PO Take 1 tablet by mouth daily.    . Ferrous Sulfate (IRON) 325 (  65 Fe) MG TABS Take 1 tablet by mouth daily.    Marland Kitchen FLUoxetine (PROZAC) 20 MG capsule TAKE 1 CAPSULE BY MOUTH EVERY DAY 90 capsule 1  . JUNEL 1/20 1-20 MG-MCG tablet Take 1 tablet by mouth daily.    . Multiple Vitamin (MULTIVITAMIN WITH MINERALS) TABS tablet Take 1 tablet by mouth daily.     No current facility-administered medications for this visit.   Facility-Administered Medications Ordered in Other Visits  Medication Dose Route Frequency Provider Last Rate Last Admin  . doxycycline (VIBRAMYCIN) 200 mg in dextrose 5 % 250 mL IVPB  200 mg Intravenous On Call to OR Akane Tessier R, MD        Review of Systems  Constitutional: Negative for chills, fatigue, fever and unexpected weight change.  HENT: Negative for trouble swallowing.  Eyes: Negative for loss of vision.  Respiratory: Negative  for cough, shortness of breath and wheezing.  Cardiovascular: Negative for chest pain, leg swelling, palpitations and syncope.  GI: Negative for abdominal pain, blood in stool, diarrhea, nausea and vomiting.  GU: Negative for difficulty urinating, dysuria, frequency and hematuria.  Musculoskeletal: Negative for back pain, leg pain and joint pain.  Skin: Negative for rash.  Neurological: Negative for dizziness, headaches, light-headedness, numbness and seizures.  Psychiatric: Negative for behavioral problem, confusion, depressed mood and sleep disturbance.        Objective:  Objective   Vitals:   02/08/20 0840  BP: 124/70  Weight: 182 lb 9.6 oz (82.8 kg)  Height: 5\' 3"  (1.6 m)   Body mass index is 32.35 kg/m.  Physical Exam Vitals and nursing note reviewed.  Constitutional:      Appearance: She is well-developed.  HENT:     Head: Normocephalic and atraumatic.  Eyes:     Pupils: Pupils are equal, round, and reactive to light.  Cardiovascular:     Rate and Rhythm: Normal rate and regular rhythm.  Pulmonary:     Effort: Pulmonary effort is normal. No respiratory distress.  Skin:    General: Skin is warm and dry.  Neurological:     Mental Status: She is alert and oriented to person, place, and time.  Psychiatric:        Behavior: Behavior normal.        Thought Content: Thought content normal.        Judgment: Judgment normal.        Assessment/Plan:     29 yo with retained products of conception Discussed options of expectant management, medical therapy and dilation and evacuation. Patinet would like to proceed with Dilation and evacuation. Discussed risks of this procedure including bleeding, infection, and damage to surrounding pelvic organs. Consents signed.  More than 25 minutes were spent face to face with the patient in the room, reviewing the medical record, labs and images, and coordinating care for the patient. The plan of management was discussed in detail  and counseling was provided.     37 MD [facility], [facility] 02/10/2020 5:52 AM

## 2020-02-08 NOTE — Progress Notes (Signed)
Patient ID: Molly Graves, female   DOB: 09-14-1990, 29 y.o.   MRN: [id number]  Reason for Consult: Gynecologic Exam (U/S)   Referred by Nira Retort  Subjective:     HPI:  Molly Graves is a 29 y.o. female female. She reports that she had a termination on 12/16/2019. She took medication and passed products of conception at home.  She had 4 days of heavy bleeding and has continued spotting since that time. She denies pelvic pain or fever. She has not been sexually active, She is using OCPs.    Past Medical History:  Diagnosis Date  . Anemia   . Back pain affecting pregnancy in third trimester 11/17/2018  . Complication of intrauterine device (IUD) (HCC) 04/19/2016  . Depression    post partum depression  . Dysuria 12/13/2018  . Headache    MIGRAINES  . Herpes genitalia   . Normal vaginal delivery 12/25/2018  . Postpartum care following vaginal delivery 12/25/2018  . Pyelonephritis affecting pregnancy in second trimester 08/08/2018  . Supervision of other normal pregnancy, antepartum 05/22/2018   Clinic [facility] Prenatal Labs Dating [place] Blood type: O/Positive/-- (12/23 1605)  Genetic Screen declines Antibody:Negative (12/23 1605) Anatomic [place]  incomplete Rubella: 1.86 (12/23 1605) Varicella:   GTT Third trimester:  RPR: Non Reactive (12/23 1605)  Rhogam O+ HBsAg: Negative (12/23 1605)  TDaP vaccine              Flu Shot: HIV: Non Reactive (12/23 1605)  Baby Food                                 Family History  Problem Relation Age of Onset  . Hypertension Maternal Aunt   . Hypertension Maternal Grandmother   . Cancer Other 29       Cervical, Malignant  . Depression Mother   . Hypertension Mother   . Cancer Maternal Uncle   . Cancer Paternal Aunt    Past Surgical History:  Procedure Laterality Date  . BREAST SURGERY     lumpectomy left breast  . DIAGNOSTIC LAPAROSCOPY  2015   to rule out uterine injury after D&E  . FEMUR IM NAIL Left 02/18/2019   Procedure: INTRAMEDULLARY  (IM) NAIL FEMORAL;  Surgeon: Vickki Hearing, MD;  Location: [facility];  Service: Orthopedics;  Laterality: Left;  . HYSTEROSCOPY WITH D & C N/A 04/19/2016   Procedure: DILATATION AND CURETTAGE /HYSTEROSCOPY;  Surgeon: Nadara Mustard, MD;  Location: [facility];  Service: Gynecology;  Laterality: N/A;  . INDUCED ABORTION  2015  . IUD REMOVAL N/A 04/19/2016   Procedure: INTRAUTERINE DEVICE (IUD) REMOVAL;  Surgeon: Nadara Mustard, MD;  Location: [facility];  Service: Gynecology;  Laterality: N/A;  . LAPAROSCOPY N/A 04/19/2016   Procedure: LAPAROSCOPY DIAGNOSTIC;  Surgeon: Nadara Mustard, MD;  Location: [facility];  Service: Gynecology;  Laterality: N/A;    Short Social History:  Social History   Tobacco Use  . Smoking status: Current Every Day Smoker    Years: 7.00    Types: Cigars    Last attempt to quit: 04/17/2018    Years since quitting: 1.8  . Smokeless tobacco: Never Used  Substance Use Topics  . Alcohol use: Not Currently    No Known Allergies  Current Outpatient Medications  Medication Sig Dispense Refill  . ELDERBERRY PO Take 1 tablet by mouth daily.    . Ferrous Sulfate (IRON) 325 (  65 Fe) MG TABS Take 1 tablet by mouth daily.    Marland Kitchen FLUoxetine (PROZAC) 20 MG capsule TAKE 1 CAPSULE BY MOUTH EVERY DAY 90 capsule 1  . JUNEL 1/20 1-20 MG-MCG tablet Take 1 tablet by mouth daily.    . Multiple Vitamin (MULTIVITAMIN WITH MINERALS) TABS tablet Take 1 tablet by mouth daily.     No current facility-administered medications for this visit.   Facility-Administered Medications Ordered in Other Visits  Medication Dose Route Frequency Provider Last Rate Last Admin  . doxycycline (VIBRAMYCIN) 200 mg in dextrose 5 % 250 mL IVPB  200 mg Intravenous On Call to OR Jametta Moorehead R, MD        Review of Systems  Constitutional: Negative for chills, fatigue, fever and unexpected weight change.  HENT: Negative for trouble swallowing.  Eyes: Negative for loss of vision.  Respiratory: Negative  for cough, shortness of breath and wheezing.  Cardiovascular: Negative for chest pain, leg swelling, palpitations and syncope.  GI: Negative for abdominal pain, blood in stool, diarrhea, nausea and vomiting.  GU: Negative for difficulty urinating, dysuria, frequency and hematuria.  Musculoskeletal: Negative for back pain, leg pain and joint pain.  Skin: Negative for rash.  Neurological: Negative for dizziness, headaches, light-headedness, numbness and seizures.  Psychiatric: Negative for behavioral problem, confusion, depressed mood and sleep disturbance.        Objective:  Objective   Vitals:   02/08/20 0840  BP: 124/70  Weight: 182 lb 9.6 oz (82.8 kg)  Height: 5\' 3"  (1.6 m)   Body mass index is 32.35 kg/m.  Physical Exam Vitals and nursing note reviewed.  Constitutional:      Appearance: She is well-developed.  HENT:     Head: Normocephalic and atraumatic.  Eyes:     Pupils: Pupils are equal, round, and reactive to light.  Cardiovascular:     Rate and Rhythm: Normal rate and regular rhythm.  Pulmonary:     Effort: Pulmonary effort is normal. No respiratory distress.  Skin:    General: Skin is warm and dry.  Neurological:     Mental Status: She is alert and oriented to person, place, and time.  Psychiatric:        Behavior: Behavior normal.        Thought Content: Thought content normal.        Judgment: Judgment normal.        Assessment/Plan:     29 yo with retained products of conception Discussed options of expectant management, medical therapy and dilation and evacuation. Patinet would like to proceed with Dilation and evacuation. Discussed risks of this procedure including bleeding, infection, and damage to surrounding pelvic organs. Consents signed.  More than 25 minutes were spent face to face with the patient in the room, reviewing the medical record, labs and images, and coordinating care for the patient. The plan of management was discussed in detail  and counseling was provided.     37 MD [facility], [facility] 02/10/2020 5:52 AM

## 2020-02-09 ENCOUNTER — Encounter
Admission: RE | Admit: 2020-02-09 | Discharge: 2020-02-09 | Disposition: A | Discharge status: Home / Self Care | Payer: Medicaid Other | Source: Ambulatory Visit | Attending: Obstetrics and Gynecology | Admitting: Obstetrics and Gynecology

## 2020-02-09 ENCOUNTER — Other Ambulatory Visit: Payer: Self-pay

## 2020-02-09 ENCOUNTER — Telehealth: Payer: Self-pay | Admitting: Obstetrics and Gynecology

## 2020-02-09 DIAGNOSIS — Z20822 Contact with and (suspected) exposure to covid-19: Secondary | ICD-10-CM | POA: Insufficient documentation

## 2020-02-09 DIAGNOSIS — Z01812 Encounter for preprocedural laboratory examination: Secondary | ICD-10-CM | POA: Diagnosis not present

## 2020-02-09 LAB — CBC
HCT: 37.9 % (ref 36.0–46.0)
Hemoglobin: 12.4 g/dL (ref 12.0–15.0)
MCH: 28.1 pg (ref 26.0–34.0)
MCHC: 32.7 g/dL (ref 30.0–36.0)
MCV: 85.9 fL (ref 80.0–100.0)
Platelets: 326 10*3/uL (ref 150–400)
RBC: 4.41 MIL/uL (ref 3.87–5.11)
RDW: 12.7 % (ref 11.5–15.5)
WBC: 6.6 10*3/uL (ref 4.0–10.5)
nRBC: 0 % (ref 0.0–0.2)

## 2020-02-09 LAB — TYPE AND SCREEN
ABO/RH(D): O POS
Antibody Screen: NEGATIVE
Extend sample reason: UNDETERMINED

## 2020-02-09 NOTE — Telephone Encounter (Signed)
Pt called to adv that she would not be able to make her PAT and Covid test appt today. I adv that with her being scheduled for OR tomorrow she will have to arrive for appt  As they will not allow surgery with out Covid testing. She adv that she had to pick up her child and that she had no one else that could do so. I adv her to call the PAT # [phone] and let them know what is going on and that she should leave a detailed msg if there was no answer.

## 2020-02-09 NOTE — Telephone Encounter (Signed)
-----   Message from Natale Milch, MD sent at 02/08/2020  9:28 AM EDT ----- Surgery Booking Request Patient Full Name:  Molly Graves  MRN: [id number]  DOB: 1991/03/23  Surgeon: Natale Milch, MD  Requested Surgery Date and Time: this Thursday Primary Diagnosis AND Code: retained POC- O73. 1  Secondary Diagnosis and Code:  Surgical Procedure: Suction D&C RNFA Requested?: No L&D Notification: No Admission Status: same day surgery Length of Surgery: 25 min Special Case Needs: No H&P: No Phone Interview???:  Yes Interpreter: No Medical Clearance:  No Special Scheduling Instructions: No Any known health/anesthesia issues, diabetes, sleep apnea, latex allergy, defibrillator/pacemaker?: No Acuity: P2   (P1 highest, P2 delay may cause harm, P3 low, elective gyn, P4 lowest)

## 2020-02-09 NOTE — Patient Instructions (Signed)
Your procedure is scheduled on: 02/10/20 Report to DAY SURGERY DEPARTMENT LOCATED ON 2ND FLOOR MEDICAL MALL ENTRANCE. To find out your arrival time please call [phone] between 1PM - 3PM on 02/09/20.  Remember: Instructions that are not followed completely may result in serious medical risk, up to and including death, or upon the discretion of your surgeon and anesthesiologist your surgery may need to be rescheduled.     _X__ 1. Do not eat food after midnight the night before your procedure.                 No gum chewing or hard candies. You may drink clear liquids up to 2 hours                 before you are scheduled to arrive for your surgery- DO not drink clear                 liquids within 2 hours of the start of your surgery.                 Clear Liquids include:  water, apple juice without pulp, clear carbohydrate                 drink such as Clearfast or Gatorade, Black Coffee or Tea (Do not add                 anything to coffee or tea). Diabetics water only  __X__2.  On the morning of surgery brush your teeth with toothpaste and water, you                 may rinse your mouth with mouthwash if you wish.  Do not swallow any              toothpaste of mouthwash.     _X__ 3.  No Alcohol for 24 hours before or after surgery.   _X__ 4.  Do Not Smoke or use e-cigarettes For 24 Hours Prior to Your Surgery.                 Do not use any chewable tobacco products for at least 6 hours prior to                 surgery.  ____  5.  Bring all medications with you on the day of surgery if instructed.   __X__  6.  Notify your doctor if there is any change in your medical condition      (cold, fever, infections).     Do not wear jewelry, make-up, hairpins, clips or nail polish. Do not wear lotions, powders, or perfumes.  Do not shave 48 hours prior to surgery. Men may shave face and neck. Do not bring valuables to the hospital.    [facility] is not responsible for any belongings or  valuables.  Contacts, dentures/partials or body piercings may not be worn into surgery. Bring a case for your contacts, glasses or hearing aids, a denture cup will be supplied. Leave your suitcase in the car. After surgery it may be brought to your room. For patients admitted to the hospital, discharge time is determined by your treatment team.   Patients discharged the day of surgery will not be allowed to drive home.   Please read over the following fact sheets that you were given:   MRSA Information  __X__ Take these medicines the morning of surgery with A SIP OF WATER:  1. none  2.   3.   4.  5.  6.  ____ Fleet Enema (as directed)   ____ Use CHG Soap/SAGE wipes as directed  ____ Use inhalers on the day of surgery  ____ Stop metformin/Janumet/Farxiga 2 days prior to surgery    ____ Take 1/2 of usual insulin dose the night before surgery. No insulin the morning          of surgery.   ____ Stop Blood Thinners Coumadin/Plavix/Xarelto/Pleta/Pradaxa/Eliquis/Effient/Aspirin  on   Or contact your Surgeon, Cardiologist or Medical Doctor regarding  ability to stop your blood thinners  __X__ Stop Anti-inflammatories 7 days before surgery such as Advil, Ibuprofen, Motrin,  BC or Goodies Powder, Naprosyn, Naproxen, Aleve, Aspirin    __X__ Stop all herbal supplements, fish oil or vitamin E until after surgery.    ____ Bring C-Pap to the hospital.

## 2020-02-10 ENCOUNTER — Ambulatory Visit: Payer: Medicaid Other

## 2020-02-10 ENCOUNTER — Ambulatory Visit
Admission: RE | Admit: 2020-02-10 | Discharge: 2020-02-10 | Disposition: A | Discharge status: Home / Self Care | Payer: Medicaid Other | Attending: Obstetrics and Gynecology | Admitting: Obstetrics and Gynecology

## 2020-02-10 ENCOUNTER — Encounter: Payer: Self-pay | Admitting: Obstetrics and Gynecology

## 2020-02-10 ENCOUNTER — Other Ambulatory Visit: Payer: Self-pay

## 2020-02-10 ENCOUNTER — Encounter
Admission: RE | Disposition: A | Discharge status: Home / Self Care | Payer: Self-pay | Source: Home / Self Care | Attending: Obstetrics and Gynecology

## 2020-02-10 DIAGNOSIS — F329 Major depressive disorder, single episode, unspecified: Secondary | ICD-10-CM | POA: Diagnosis not present

## 2020-02-10 DIAGNOSIS — Z793 Long term (current) use of hormonal contraceptives: Secondary | ICD-10-CM | POA: Diagnosis not present

## 2020-02-10 DIAGNOSIS — O021 Missed abortion: Secondary | ICD-10-CM

## 2020-02-10 DIAGNOSIS — O99340 Other mental disorders complicating pregnancy, unspecified trimester: Secondary | ICD-10-CM | POA: Diagnosis not present

## 2020-02-10 DIAGNOSIS — F17290 Nicotine dependence, other tobacco product, uncomplicated: Secondary | ICD-10-CM | POA: Diagnosis not present

## 2020-02-10 DIAGNOSIS — O99330 Smoking (tobacco) complicating pregnancy, unspecified trimester: Secondary | ICD-10-CM | POA: Insufficient documentation

## 2020-02-10 DIAGNOSIS — O074 Failed attempted termination of pregnancy without complication: Secondary | ICD-10-CM | POA: Insufficient documentation

## 2020-02-10 DIAGNOSIS — O034 Incomplete spontaneous abortion without complication: Secondary | ICD-10-CM

## 2020-02-10 DIAGNOSIS — Z79899 Other long term (current) drug therapy: Secondary | ICD-10-CM | POA: Insufficient documentation

## 2020-02-10 HISTORY — PX: DILATION AND EVACUATION: SHX1459

## 2020-02-10 LAB — SARS CORONAVIRUS 2 (TAT 6-24 HRS): SARS Coronavirus 2: NEGATIVE

## 2020-02-10 SURGERY — DILATION AND EVACUATION, UTERUS
Anesthesia: General

## 2020-02-10 MED ORDER — FENTANYL CITRATE (PF) 100 MCG/2ML IJ SOLN: 25.0000 ug | INTRAMUSCULAR | Status: DC | PRN

## 2020-02-10 MED ORDER — LACTATED RINGERS IV SOLN: INTRAVENOUS | Status: DC

## 2020-02-10 MED ORDER — MIDAZOLAM HCL 2 MG/2ML IJ SOLN
INTRAMUSCULAR | Status: AC
  Filled 2020-02-10: qty 2

## 2020-02-10 MED ORDER — DOXYCYCLINE HYCLATE 100 MG IV SOLR
200.0000 mg | INTRAVENOUS | Status: AC
  Administered 2020-02-10: 200 mg via INTRAVENOUS
  Filled 2020-02-10: qty 200

## 2020-02-10 MED ORDER — FENTANYL CITRATE (PF) 250 MCG/5ML IJ SOLN
INTRAMUSCULAR | Status: AC
  Filled 2020-02-10: qty 5

## 2020-02-10 MED ORDER — FENTANYL CITRATE (PF) 100 MCG/2ML IJ SOLN
INTRAMUSCULAR | Status: DC | PRN
Start: 2020-02-10 — End: 2020-02-10
  Administered 2020-02-10 (×2): 50 ug via INTRAVENOUS

## 2020-02-10 MED ORDER — DROPERIDOL 2.5 MG/ML IJ SOLN
0.6250 mg | Freq: Once | INTRAMUSCULAR | Status: DC | PRN
  Filled 2020-02-10: qty 2

## 2020-02-10 MED ORDER — KETOROLAC TROMETHAMINE 30 MG/ML IJ SOLN: 30.0000 mg | Freq: Once | INTRAMUSCULAR | Status: DC | PRN

## 2020-02-10 MED ORDER — DEXMEDETOMIDINE HCL 200 MCG/2ML IV SOLN
INTRAVENOUS | Status: DC | PRN
  Administered 2020-02-10: 8 ug via INTRAVENOUS
  Administered 2020-02-10: 12 ug via INTRAVENOUS

## 2020-02-10 MED ORDER — TRAMADOL HCL 50 MG PO TABS: 50.0000 mg | ORAL_TABLET | Freq: Four times a day (QID) | ORAL | 0 refills | Status: DC | PRN

## 2020-02-10 MED ORDER — HYDROCODONE-ACETAMINOPHEN 7.5-325 MG PO TABS
1.0000 | ORAL_TABLET | Freq: Once | ORAL | Status: DC | PRN
  Filled 2020-02-10: qty 1

## 2020-02-10 MED ORDER — FENTANYL CITRATE (PF) 100 MCG/2ML IJ SOLN
INTRAMUSCULAR | Status: AC
  Filled 2020-02-10: qty 2

## 2020-02-10 MED ORDER — ACETAMINOPHEN 160 MG/5ML PO SOLN
325.0000 mg | ORAL | Status: DC | PRN
  Filled 2020-02-10: qty 20.3

## 2020-02-10 MED ORDER — ACETAMINOPHEN 325 MG PO TABS: 325.0000 mg | ORAL_TABLET | ORAL | Status: DC | PRN

## 2020-02-10 MED ORDER — DIPHENHYDRAMINE HCL 50 MG/ML IJ SOLN
INTRAMUSCULAR | Status: DC | PRN
  Administered 2020-02-10: 12.5 mg via INTRAVENOUS

## 2020-02-10 MED ORDER — CHLORHEXIDINE GLUCONATE 0.12 % MT SOLN: 15.0000 mL | Freq: Once | OROMUCOSAL | Status: AC

## 2020-02-10 MED ORDER — PROPOFOL 10 MG/ML IV BOLUS
INTRAVENOUS | Status: AC
  Filled 2020-02-10: qty 20

## 2020-02-10 MED ORDER — LIDOCAINE HCL (PF) 2 % IJ SOLN
INTRAMUSCULAR | Status: AC
  Filled 2020-02-10: qty 5

## 2020-02-10 MED ORDER — PROMETHAZINE HCL 25 MG/ML IJ SOLN: 6.2500 mg | INTRAMUSCULAR | Status: DC | PRN

## 2020-02-10 MED ORDER — ONDANSETRON HCL 4 MG/2ML IJ SOLN
INTRAMUSCULAR | Status: AC
  Filled 2020-02-10: qty 2

## 2020-02-10 MED ORDER — CHLORHEXIDINE GLUCONATE 0.12 % MT SOLN
OROMUCOSAL | Status: AC
  Administered 2020-02-10: 15 mL via OROMUCOSAL
  Filled 2020-02-10: qty 15

## 2020-02-10 MED ORDER — IBUPROFEN 600 MG PO TABS: 600.0000 mg | ORAL_TABLET | Freq: Four times a day (QID) | ORAL | 0 refills | Status: DC | PRN

## 2020-02-10 MED ORDER — FAMOTIDINE IN NACL 20-0.9 MG/50ML-% IV SOLN
20.0000 mg | Freq: Once | INTRAVENOUS | Status: DC
  Filled 2020-02-10: qty 50

## 2020-02-10 MED ORDER — DEXAMETHASONE SODIUM PHOSPHATE 10 MG/ML IJ SOLN
INTRAMUSCULAR | Status: DC | PRN
  Administered 2020-02-10: 10 mg via INTRAVENOUS

## 2020-02-10 MED ORDER — ORAL CARE MOUTH RINSE: 15.0000 mL | Freq: Once | OROMUCOSAL | Status: AC

## 2020-02-10 MED ORDER — PROPOFOL 10 MG/ML IV BOLUS
INTRAVENOUS | Status: DC | PRN
  Administered 2020-02-10: 200 mg via INTRAVENOUS

## 2020-02-10 MED ORDER — FAMOTIDINE 20 MG PO TABS
ORAL_TABLET | ORAL | Status: AC
  Administered 2020-02-10: 20 mg via ORAL
  Filled 2020-02-10: qty 1

## 2020-02-10 MED ORDER — MIDAZOLAM HCL 2 MG/2ML IJ SOLN
INTRAMUSCULAR | Status: DC | PRN
  Administered 2020-02-10: 2 mg via INTRAVENOUS

## 2020-02-10 MED ORDER — FAMOTIDINE 20 MG PO TABS: 20.0000 mg | ORAL_TABLET | Freq: Once | ORAL | Status: AC

## 2020-02-10 MED ORDER — DEXAMETHASONE SODIUM PHOSPHATE 10 MG/ML IJ SOLN
INTRAMUSCULAR | Status: AC
  Filled 2020-02-10: qty 1

## 2020-02-10 MED ORDER — ONDANSETRON HCL 4 MG/2ML IJ SOLN
INTRAMUSCULAR | Status: DC | PRN
  Administered 2020-02-10: 4 mg via INTRAVENOUS

## 2020-02-10 SURGICAL SUPPLY — 22 items
BAG COUNTER SPONGE EZ (MISCELLANEOUS) IMPLANT
BAG SPNG 4X4 CLR HAZ (MISCELLANEOUS)
CATH ROBINSON RED A/P 16FR (CATHETERS) IMPLANT
FILTER UTR ASPR SPEC (MISCELLANEOUS) ×1 IMPLANT
FLTR UTR ASPR SPEC (MISCELLANEOUS) ×2
GLOVE BIOGEL PI IND STRL 6.5 (GLOVE) ×2 IMPLANT
GLOVE BIOGEL PI INDICATOR 6.5 (GLOVE) ×2
GLOVE SURG SYN 6.5 ES PF (GLOVE) ×4 IMPLANT
GOWN STRL REUS W/ TWL LRG LVL3 (GOWN DISPOSABLE) ×2 IMPLANT
GOWN STRL REUS W/TWL LRG LVL3 (GOWN DISPOSABLE) ×4
KIT BERKELEY 1ST TRIMESTER 3/8 (MISCELLANEOUS) ×2 IMPLANT
KIT TURNOVER CYSTO (KITS) ×2 IMPLANT
NS IRRIG 500ML POUR BTL (IV SOLUTION) ×2 IMPLANT
PACK DNC HYST (MISCELLANEOUS) ×2 IMPLANT
PAD OB MATERNITY 4.3X12.25 (PERSONAL CARE ITEMS) ×2 IMPLANT
PAD PREP 24X41 OB/GYN DISP (PERSONAL CARE ITEMS) ×2 IMPLANT
SET BERKELEY SUCTION TUBING (SUCTIONS) ×2 IMPLANT
TOWEL OR 17X26 4PK STRL BLUE (TOWEL DISPOSABLE) ×2 IMPLANT
VACURETTE 10 RIGID CVD (CANNULA) IMPLANT
VACURETTE 12 RIGID CVD (CANNULA) IMPLANT
VACURETTE 8 RIGID CVD (CANNULA) IMPLANT
VACURETTE 8MM F TIP (MISCELLANEOUS) ×2 IMPLANT

## 2020-02-10 NOTE — Transfer of Care (Signed)
Immediate Anesthesia Transfer of Care Note  Patient: Arisha Gervais Oesterle  Procedure(s) Performed: DILATATION AND EVACUATION (N/A )  Patient Location: PACU  Anesthesia Type:General  Level of Consciousness: drowsy and patient cooperative  Airway & Oxygen Therapy: Patient Spontanous Breathing and Patient connected to face mask oxygen  Post-op Assessment: Report given to RN and Post -op Vital signs reviewed and stable  Post vital signs: Reviewed and stable  Last Vitals:  Vitals Value Taken Time  BP 103/64 02/10/20 1416  Temp    Pulse 56 02/10/20 1416  Resp 12 02/10/20 1416  SpO2 100 % 02/10/20 1416  Vitals shown include unvalidated device data.  Last Pain:  Vitals:   02/10/20 1415  TempSrc:   PainSc: (P) Asleep         Complications: No complications documented.

## 2020-02-10 NOTE — Discharge Instructions (Signed)
Dilation and Curettage or Vacuum Curettage, Care After These instructions give you information about caring for yourself after your procedure. Your doctor may also give you more specific instructions. Call your doctor if you have any problems or questions after your procedure. Follow these instructions at home: Activity  Do not drive or use heavy machinery while taking prescription pain medicine.  For 24 hours after your procedure, avoid driving.  Take short walks often, followed by rest periods. Ask your doctor what activities are safe for you. After one or two days, you may be able to return to your normal activities.  Do not lift anything that is heavier than 10 lb (4.5 kg) until your doctor approves.  For at least 2 weeks, or as long as told by your doctor: ? Do not douche. ? Do not use tampons. ? Do not have sex. General instructions   Take over-the-counter and prescription medicines only as told by your doctor. This is very important if you take blood thinning medicine.  Do not take baths, swim, or use a hot tub until your doctor approves. Take showers instead of baths.  Wear compression stockings as told by your doctor.  It is up to you to get the results of your procedure. Ask your doctor when your results will be ready.  Keep all follow-up visits as told by your doctor. This is important. Contact a doctor if:  You have very bad cramps that get worse or do not get better with medicine.  You have very bad pain in your belly (abdomen).  You cannot drink fluids without throwing up (vomiting).  You get pain in a different part of the area between your belly and thighs (pelvis).  You have bad-smelling discharge from your vagina.  You have a rash. Get help right away if:  You are bleeding a lot from your vagina. A lot of bleeding means soaking more than one sanitary pad in an hour, for 2 hours in a row.  You have clumps of blood (blood clots) coming from your  vagina.  You have a fever or chills.  Your belly feels very tender or hard.  You have chest pain.  You have trouble breathing.  You cough up blood.  You feel dizzy.  You feel light-headed.  You pass out (faint).  You have pain in your neck or shoulder area. Summary  Take short walks often, followed by rest periods. Ask your doctor what activities are safe for you. After one or two days, you may be able to return to your normal activities.  Do not lift anything that is heavier than 10 lb (4.5 kg) until your doctor approves.  Do not take baths, swim, or use a hot tub until your doctor approves. Take showers instead of baths.  Contact your doctor if you have any symptoms of infection, like bad-smelling discharge from your vagina. This information is not intended to replace advice given to you by your health care provider. Make sure you discuss any questions you have with your health care provider. Document Revised: 05/16/2017 Document Reviewed: 02/19/2016 Elsevier Patient Education  2020 Elsevier Inc[employer]   AMBULATORY SURGERY  DISCHARGE INSTRUCTIONS   1) The drugs that you were given will stay in your system until tomorrow so for the next 24 hours you should not:  A) Drive an automobile B) Make any legal decisions C) Drink any alcoholic beverage   2) You may resume regular meals tomorrow.  Today it is better to start with   liquids and gradually work up to solid foods.  You may eat anything you prefer, but it is better to start with liquids, then soup and crackers, and gradually work up to solid foods.   3) Please notify your doctor immediately if you have any unusual bleeding, trouble breathing, redness and pain at the surgery site, drainage, fever, or pain not relieved by medication.    4) Additional Instructions:        Please contact your physician with any problems or Same Day Surgery at [phone], Monday through Friday 6 am to 4 pm, or [place] at  [facility] number at [phone]. 

## 2020-02-10 NOTE — Op Note (Signed)
  Operative Note  02/10/2020 2:30 PM  PRE-OP DIAGNOSIS: retained POC O73.1   POST-OP DIAGNOSIS: same  SURGEON: Faustino Luecke MD  ANESTHESIA: Choice   PROCEDURE: Procedure(s): DILATATION AND EVACUATION   ESTIMATED BLOOD LOSS: 50 cc   SPECIMENS: POC   COMPLICATIONS: none  DISPOSITION: PACU - hemodynamically stable.  CONDITION: stable  FINDINGS: Exam under anesthesia revealed a 9 wk size uterus without palpable adnexal masses.   INDICATION FOR PROCEDURE: retained products of conception  PROCEDURE IN DETAIL: After informed consent was obtained, the patient was taken to the operating room where anesthesia was obtained without difficulty. The patient was positioned in the dorsal lithotomy position with [employer]. Time out was performed and an exam under anesthesia was performed. The vagina, perineum, and lower abdomen were prepped and draped in a normal sterile fashion. The bladder was emptied with an I&O catheter. A speculum was placed into the vagina and the cervix was grasped with a single toothed tenaculum.  The cervix was gently dilated to 14 [place] with  [employer] dilators. The suction was then tested and found to be adequate, and a 8 mm flexible suction cannula was advanced into the uterine cavity. The suction was activated and the contents of the uterus were aspirated until no further tissue was obtained. The uterus was then curetted to gritty texture throughout.  At the end of the procedure bleeding was noted to beMinimal.  All instruments were then removed from the vagina.The patient tolerated the procedure well. All sponge, instrument, and needle counts were correct. The patient was taken to the recovery room in good condition.   Adelene Idler MD [facility], [facility] 02/10/2020 2:30 PM

## 2020-02-10 NOTE — Anesthesia Procedure Notes (Signed)
Procedure Name: LMA Insertion Performed by: Hubbert Landrigan L, CRNA Pre-anesthesia Checklist: Patient identified, Patient being monitored, Timeout performed, Emergency Drugs available and Suction available Patient Re-evaluated:Patient Re-evaluated prior to induction Oxygen Delivery Method: Circle system utilized Preoxygenation: Pre-oxygenation with 100% oxygen Induction Type: IV induction Ventilation: Mask ventilation without difficulty LMA: LMA inserted LMA Size: 4.0 Tube type: Oral Number of attempts: 1 Placement Confirmation: positive ETCO2 and breath sounds checked- equal and bilateral Tube secured with: Tape Dental Injury: Teeth and Oropharynx as per pre-operative assessment        

## 2020-02-10 NOTE — Anesthesia Preprocedure Evaluation (Signed)
Anesthesia Evaluation  Patient identified by MRN, date of birth, ID band Patient awake    Reviewed: Allergy & Precautions, H&P , NPO status , reviewed documented beta blocker date and time   Airway Mallampati: II  TM Distance: >3 FB Neck ROM: full    Dental  (+) Chipped, Poor Dentition Poor molar condition:   Pulmonary Current Smoker and Patient abstained from smoking.,    Pulmonary exam normal        Cardiovascular Normal cardiovascular exam     Neuro/Psych  Headaches, PSYCHIATRIC DISORDERS Depression    GI/Hepatic neg GERD  ,  Endo/Other    Renal/GU      Musculoskeletal   Abdominal   Peds  Hematology  (+) Blood dyscrasia, anemia ,   Anesthesia Other Findings Past Medical History: No date: Anemia 11/17/2018: Back pain affecting pregnancy in third trimester 04/19/2016: Complication of intrauterine device (IUD) (HCC) No date: Depression     Comment:  post partum depression 12/13/2018: Dysuria No date: Headache     Comment:  MIGRAINES 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 12/25/2018: Normal vaginal delivery 12/25/2018: Postpartum care following vaginal delivery 08/08/2018: Pyelonephritis affecting pregnancy in second trimester 05/22/2018: Supervision of other normal pregnancy, antepartum     Comment:  Clinic [facility] Prenatal Labs Dating [place] Blood type:               O/Positive/-- (12/23 1605)  Genetic               Screen declines Antibody:Negative (12/23 1605) Anatomic               [place]  incomplete Rubella: 1.86 (12/23 1605) Varicella:                 GTT Third trimester:  RPR: Non Reactive (12/23 1605)                Rhogam O+ HBsAg: Negative (12/23 1605)  TDaP vaccine                   Flu Shot: HIV: Non Reactive (12/23 1605)  Baby Food       Past Surgical History: No date: BREAST SURGERY     Comment:  lumpectomy left breast 2015: DIAGNOSTIC LAPAROSCOPY     Comment:  to rule out uterine injury after D&E 02/18/2019: FEMUR IM  NAIL; Left     Comment:  Procedure: INTRAMEDULLARY (IM) NAIL FEMORAL;  Surgeon:               Vickki Hearing, MD;  Location: [facility];  Service:               Orthopedics;  Laterality: Left; 04/19/2016: HYSTEROSCOPY WITH D & C; N/A     Comment:  Procedure: DILATATION AND CURETTAGE /HYSTEROSCOPY;                Surgeon: Nadara Mustard, MD;  Location: [facility];                Service: Gynecology;  Laterality: N/A; 2015: INDUCED ABORTION 04/19/2016: IUD REMOVAL; N/A     Comment:  Procedure: INTRAUTERINE DEVICE (IUD) REMOVAL;  Surgeon:               Nadara Mustard, MD;  Location: [facility];  Service:               Gynecology;  Laterality: N/A; 04/19/2016: LAPAROSCOPY; N/A     Comment:  Procedure: LAPAROSCOPY DIAGNOSTIC;  Surgeon: Harrel Lemon  Tiburcio Pea, MD;  Location: [facility];  Service: Gynecology;                Laterality: N/A; BMI    Body Mass Index: 31.41 kg/m     Reproductive/Obstetrics                             Anesthesia Physical Anesthesia Plan  ASA: II  Anesthesia Plan: General   Post-op Pain Management:    Induction: Intravenous  PONV Risk Score and Plan: 3 and Ondansetron, Midazolam, Diphenhydramine, Dexamethasone and Treatment may vary due to age or medical condition  Airway Management Planned: LMA  Additional Equipment:   Intra-op Plan:   Post-operative Plan: Extubation in OR  Informed Consent: I have reviewed the patients History and Physical, chart, labs and discussed the procedure including the risks, benefits and alternatives for the proposed anesthesia with the patient or authorized representative who has indicated his/her understanding and acceptance.     Dental Advisory Given  Plan Discussed with: CRNA  Anesthesia Plan Comments:         Anesthesia Quick Evaluation

## 2020-02-10 NOTE — Interval H&P Note (Signed)
History and Physical Interval Note:  02/10/2020 1:00 PM  Molly Graves  has presented today for surgery, with the diagnosis of retained POC O73.1.  The various methods of treatment have been discussed with the patient and family. After consideration of risks, benefits and other options for treatment, the patient has consented to  Procedure(s): DILATATION AND EVACUATION (N/A) as a surgical intervention.  The patient's history has been reviewed, patient examined, no change in status, stable for surgery.  I have reviewed the patient's chart and labs.  Questions were answered to the patient's satisfaction.     Ellen Mayol R Garcia Dalzell

## 2020-02-11 ENCOUNTER — Encounter: Payer: Self-pay | Admitting: Obstetrics and Gynecology

## 2020-02-14 ENCOUNTER — Telehealth: Payer: Self-pay

## 2020-02-14 DIAGNOSIS — F331 Major depressive disorder, recurrent, moderate: Secondary | ICD-10-CM | POA: Diagnosis not present

## 2020-02-14 LAB — SURGICAL PATHOLOGY

## 2020-02-14 NOTE — Telephone Encounter (Signed)
Pt calling triage stating she thinks she needs her medication dosage increased, she is having thoughts of harming herself/others. Her therapist said to mention it to her doctor,

## 2020-02-18 ENCOUNTER — Encounter: Payer: Self-pay | Admitting: Obstetrics and Gynecology

## 2020-02-18 ENCOUNTER — Ambulatory Visit (INDEPENDENT_AMBULATORY_CARE_PROVIDER_SITE_OTHER): Payer: Medicaid Other | Admitting: Obstetrics and Gynecology

## 2020-02-18 ENCOUNTER — Ambulatory Visit: Payer: Medicaid Other | Admitting: Obstetrics and Gynecology

## 2020-02-18 ENCOUNTER — Other Ambulatory Visit: Payer: Self-pay

## 2020-02-18 VITALS — BP 120/80 | Ht 63.0 in | Wt 181.0 lb

## 2020-02-18 DIAGNOSIS — Z3041 Encounter for surveillance of contraceptive pills: Secondary | ICD-10-CM

## 2020-02-18 DIAGNOSIS — Z9889 Other specified postprocedural states: Secondary | ICD-10-CM

## 2020-02-18 DIAGNOSIS — F321 Major depressive disorder, single episode, moderate: Secondary | ICD-10-CM

## 2020-02-18 MED ORDER — FLUOXETINE HCL 40 MG PO CAPS: 40.0000 mg | ORAL_CAPSULE | Freq: Every day | ORAL | 11 refills | Status: DC

## 2020-02-18 MED ORDER — NORETHIN ACE-ETH ESTRAD-FE 1-20 MG-MCG PO TABS: 1.0000 | ORAL_TABLET | Freq: Every day | ORAL | 11 refills | Status: DC

## 2020-02-18 NOTE — Progress Notes (Signed)
  Postoperative Follow-up Patient presents post op from D&E for retained POC, 1 week ago.  Subjective: Patient reports marked improvement in her preop symptoms. Eating a regular diet without difficulty. The patient is not having any pain.  Activity: normal activities of daily living. Patient reports additional symptom's since surgery of None.  Objective: There were no vitals taken for this visit. Physical Exam Constitutional:      Appearance: She is well-developed.  Genitourinary:     Vagina and uterus normal.     No lesions in the vagina.     No cervical motion tenderness.     No right or left adnexal mass present.  HENT:     Head: Normocephalic and atraumatic.  Neck:     Thyroid: No thyromegaly.  Cardiovascular:     Rate and Rhythm: Normal rate and regular rhythm.     Heart sounds: Normal heart sounds.  Pulmonary:     Effort: Pulmonary effort is normal.     Breath sounds: Normal breath sounds.  Chest:     Breasts:        Right: No inverted nipple, mass, nipple discharge or skin change.        Left: No inverted nipple, mass, nipple discharge or skin change.  Abdominal:     General: Bowel sounds are normal. There is no distension.     Palpations: Abdomen is soft. There is no mass.  Musculoskeletal:     Cervical back: Neck supple.  Neurological:     Mental Status: She is alert and oriented to person, place, and time.  Skin:    General: Skin is warm and dry.  Psychiatric:        Behavior: Behavior normal.        Thought Content: Thought content normal.        Judgment: Judgment normal.  Vitals reviewed.    Assessment: s/p :  D&E stable  Plan: Patient has done well after surgery with no apparent complications.  I have discussed the post-operative course to date, and the expected progress moving forward.  The patient understands what complications to be concerned about.  I will see the patient in routine follow up, or sooner if needed.    Increased prozac to 40 mg  daily Will follow up as needed if symptoms of depression continue.  Refill of birth control sent.   Activity plan: No restriction. Pelvic rest.  Mallisa Alameda R Laquanna Veazey 02/18/2020, 3:09 PM

## 2020-02-18 NOTE — Patient Instructions (Signed)
Iron-Rich Diet  Iron is a mineral that helps your body to produce hemoglobin. Hemoglobin is a protein in red blood cells that carries oxygen to your body's tissues. Eating too little iron may cause you to feel weak and tired, and it can increase your risk of infection. Iron is naturally found in many foods, and many foods have iron added to them (iron-fortified foods). You may need to follow an iron-rich diet if you do not have enough iron in your body due to certain medical conditions. The amount of iron that you need each day depends on your age, your sex, and any medical conditions you have. Follow instructions from your health care provider or a diet and nutrition specialist (dietitian) about how much iron you should eat each day. What are tips for following this plan? Reading food labels  Check food labels to see how many milligrams (mg) of iron are in each serving. Cooking  Cook foods in pots and pans that are made from iron.  Take these steps to make it easier for your body to absorb iron from certain foods: ? Soak beans overnight before cooking. ? Soak whole grains overnight and drain them before using. ? Ferment flours before baking, such as by using yeast in bread dough. Meal planning  When you eat foods that contain iron, you should eat them with foods that are high in vitamin C. These include oranges, peppers, tomatoes, potatoes, and mango. Vitamin C helps your body to absorb iron. General information  Take iron supplements only as told by your health care provider. An overdose of iron can be life-threatening. If you were prescribed iron supplements, take them with orange juice or a vitamin C supplement.  When you eat iron-fortified foods or take an iron supplement, you should also eat foods that naturally contain iron, such as meat, poultry, and fish. Eating naturally iron-rich foods helps your body to absorb the iron that is added to other foods or contained in a  supplement.  Certain foods and drinks prevent your body from absorbing iron properly. Avoid eating these foods in the same meal as iron-rich foods or with iron supplements. These foods include: ? Coffee, black tea, and red wine. ? Milk, dairy products, and foods that are high in calcium. ? Beans and soybeans. ? Whole grains. What foods should I eat? Fruits Prunes. Raisins. Eat fruits high in vitamin C, such as oranges, grapefruits, and strawberries, alongside iron-rich foods. Vegetables Spinach (cooked). Green peas. Broccoli. Fermented vegetables. Eat vegetables high in vitamin C, such as leafy greens, potatoes, bell peppers, and tomatoes, alongside iron-rich foods. Grains Iron-fortified breakfast cereal. Iron-fortified whole-wheat bread. Enriched rice. Sprouted grains. Meats and other proteins Beef liver. Oysters. Beef. Shrimp. Turkey. Chicken. Tuna. Sardines. Chickpeas. Nuts. Tofu. Pumpkin seeds. Beverages Tomato juice. Fresh orange juice. Prune juice. Hibiscus tea. Fortified instant breakfast shakes. Sweets and desserts Blackstrap molasses. Seasonings and condiments Tahini. Fermented soy sauce. Other foods Wheat germ. The items listed above may not be a complete list of recommended foods and beverages. Contact a dietitian for more information. What foods should I avoid? Grains Whole grains. Bran cereal. Bran flour. Oats. Meats and other proteins Soybeans. Products made from soy protein. Black beans. Lentils. Mung beans. Split peas. Dairy Milk. Cream. Cheese. Yogurt. Cottage cheese. Beverages Coffee. Black tea. Red wine. Sweets and desserts Cocoa. Chocolate. Ice cream. Other foods Basil. Oregano. Large amounts of parsley. The items listed above may not be a complete list of foods and beverages to avoid.   Contact a dietitian for more information. Summary  Iron is a mineral that helps your body to produce hemoglobin. Hemoglobin is a protein in red blood cells that carries  oxygen to your body's tissues.  Iron is naturally found in many foods, and many foods have iron added to them (iron-fortified foods).  When you eat foods that contain iron, you should eat them with foods that are high in vitamin C. Vitamin C helps your body to absorb iron.  Certain foods and drinks prevent your body from absorbing iron properly, such as whole grains and dairy products. You should avoid eating these foods in the same meal as iron-rich foods or with iron supplements. This information is not intended to replace advice given to you by your health care provider. Make sure you discuss any questions you have with your health care provider. Document Revised: 05/16/2017 Document Reviewed: 04/29/2017 Elsevier Patient Education  2020 [employer]     Living With Depression Everyone experiences occasional disappointment, sadness, and loss in their lives. When you are feeling down, blue, or sad for at least 2 weeks in a row, it may mean that you have depression. Depression can affect your thoughts and feelings, relationships, daily activities, and physical health. It is caused by changes in the way your brain functions. If you receive a diagnosis of depression, your health care provider will tell you which type of depression you have and what treatment options are available to you. If you are living with depression, there are ways to help you recover from it and also ways to prevent it from coming back. How to cope with lifestyle changes Coping with stress     Stress is your body's reaction to life changes and events, both good and bad. Stressful situations may include:  Getting married.  The death of a spouse.  Losing a job.  Retiring.  Having a baby. Stress can last just a few hours or it can be ongoing. Stress can play a major role in depression, so it is important to learn both how to cope with stress and how to think about it differently. Talk with your health care  provider or a counselor if you would like to learn more about stress reduction. He or she may suggest some stress reduction techniques, such as:  Music therapy. This can include creating music or listening to music. Choose music that you enjoy and that inspires you.  Mindfulness-based meditation. This kind of meditation can be done while sitting or walking. It involves being aware of your normal breaths, rather than trying to control your breathing.  Centering prayer. This is a kind of meditation that involves focusing on a spiritual word or phrase. Choose a word, phrase, or sacred image that is meaningful to you and that brings you peace.  Deep breathing. To do this, expand your stomach and inhale slowly through your nose. Hold your breath for 3-5 seconds, then exhale slowly, allowing your stomach muscles to relax.  Muscle relaxation. This involves intentionally tensing muscles then relaxing them. Choose a stress reduction technique that fits your lifestyle and personality. Stress reduction techniques take time and practice to develop. Set aside 5-15 minutes a day to do them. Therapists can offer training in these techniques. The training may be covered by some insurance plans. Other things you can do to manage stress include:  Keeping a stress diary. This can help you learn what triggers your stress and ways to control your response.  Understanding what your limits  are and saying no to requests or events that lead to a schedule that is too full.  Thinking about how you respond to certain situations. You may not be able to control everything, but you can control how you react.  Adding humor to your life by watching funny films or TV shows.  Making time for activities that help you relax and not feeling guilty about spending your time this way.  Medicines Your health care provider may suggest certain medicines if he or she feels that they will help improve your condition. Avoid using alcohol  and other substances that may prevent your medicines from working properly (may interact). It is also important to:  Talk with your pharmacist or health care provider about all the medicines that you take, their possible side effects, and what medicines are safe to take together.  Make it your goal to take part in all treatment decisions (shared decision-making). This includes giving input on the side effects of medicines. It is best if shared decision-making with your health care provider is part of your total treatment plan. If your health care provider prescribes a medicine, you may not notice the full benefits of it for 4-8 weeks. Most people who are treated for depression need to be on medicine for at least 6-12 months after they feel better. If you are taking medicines as part of your treatment, do not stop taking medicines without first talking to your health care provider. You may need to have the medicine slowly decreased (tapered) over time to decrease the risk of harmful side effects. Relationships Your health care provider may suggest family therapy along with individual therapy and drug therapy. While there may not be family problems that are causing you to feel depressed, it is still important to make sure your family learns as much as they can about your mental health. Having your family's support can help make your treatment successful. How to recognize changes in your condition Everyone has a different response to treatment for depression. Recovery from major depression happens when you have not had signs of major depression for two months. This may mean that you will start to:  Have more interest in doing activities.  Feel less hopeless than you did 2 months ago.  Have more energy.  Overeat less often, or have better or improving appetite.  Have better concentration. Your health care provider will work with you to decide the next steps in your recovery. It is also important to  recognize when your condition is getting worse. Watch for these signs:  Having fatigue or low energy.  Eating too much or too little.  Sleeping too much or too little.  Feeling restless, agitated, or hopeless.  Having trouble concentrating or making decisions.  Having unexplained physical complaints.  Feeling irritable, angry, or aggressive. Get help as soon as you or your family members notice these symptoms coming back. How to get support and help from others How to talk with friends and family members about your condition  Talking to friends and family members about your condition can provide you with one way to get support and guidance. Reach out to trusted friends or family members, explain your symptoms to them, and let them know that you are working with a health care provider to treat your depression. Financial resources Not all insurance plans cover mental health care, so it is important to check with your insurance carrier. If paying for co-pays or counseling services is a problem, search for  a local or county mental health care center. They may be able to offer public mental health care services at low or no cost when you are not able to see a private health care provider. If you are taking medicine for depression, you may be able to get the generic form, which may be less expensive. Some makers of prescription medicines also offer help to patients who cannot afford the medicines they need. Follow these instructions at home:   Get the right amount and quality of sleep.  Cut down on using caffeine, tobacco, alcohol, and other potentially harmful substances.  Try to exercise, such as walking or lifting small weights.  Take over-the-counter and prescription medicines only as told by your health care provider.  Eat a healthy diet that includes plenty of vegetables, fruits, whole grains, low-fat dairy products, and lean protein. Do not eat a lot of foods that are high in solid  fats, added sugars, or salt.  Keep all follow-up visits as told by your health care provider. This is important. Contact a health care provider if:  You stop taking your antidepressant medicines, and you have any of these symptoms: ? Nausea. ? Headache. ? Feeling lightheaded. ? Chills and body aches. ? Not being able to sleep (insomnia).  You or your friends and family think your depression is getting worse. Get help right away if:  You have thoughts of hurting yourself or others. If you ever feel like you may hurt yourself or others, or have thoughts about taking your own life, get help right away. You can go to your nearest emergency department or call:  Your local emergency services (911 in the [place]  A suicide crisis helpline, such as the National Suicide Prevention Lifeline at 986-729-7781. This is open 24-hours a day. Summary  If you are living with depression, there are ways to help you recover from it and also ways to prevent it from coming back.  Work with your health care team to create a management plan that includes counseling, stress management techniques, and healthy lifestyle habits. This information is not intended to replace advice given to you by your health care provider. Make sure you discuss any questions you have with your health care provider. Document Revised: 09/25/2018 Document Reviewed: 05/06/2016 Elsevier Patient Education  2020 [employer].

## 2020-02-24 NOTE — Anesthesia Postprocedure Evaluation (Signed)
Anesthesia Post Note  Patient: Molly Graves  Procedure(s) Performed: DILATATION AND EVACUATION (N/A )  Patient location during evaluation: PACU Anesthesia Type: General Level of consciousness: awake and alert Pain management: pain level controlled Vital Signs Assessment: post-procedure vital signs reviewed and stable Respiratory status: spontaneous breathing, nonlabored ventilation and respiratory function stable Cardiovascular status: blood pressure returned to baseline and stable Postop Assessment: no apparent nausea or vomiting Anesthetic complications: no   No complications documented.   Last Vitals:  Vitals:   02/10/20 1515 02/10/20 1535  BP: 105/67 114/71  Pulse: 64 (!) 53  Resp: 15 16  Temp: 36.4 C (!) 36.3 C  SpO2: 100% 100%    Last Pain:  Vitals:   02/10/20 1535  TempSrc: Temporal  PainSc: 2                  Christia Reading

## 2020-03-20 ENCOUNTER — Ambulatory Visit: Payer: Medicaid Other | Admitting: Obstetrics

## 2020-03-27 ENCOUNTER — Telehealth: Payer: Medicaid Other | Admitting: Emergency Medicine

## 2020-03-27 DIAGNOSIS — N760 Acute vaginitis: Secondary | ICD-10-CM | POA: Diagnosis not present

## 2020-03-27 MED ORDER — METRONIDAZOLE 500 MG PO TABS: 500.0000 mg | ORAL_TABLET | Freq: Two times a day (BID) | ORAL | 0 refills | Status: DC

## 2020-03-27 MED ORDER — FLUCONAZOLE 150 MG PO TABS: 150.0000 mg | ORAL_TABLET | Freq: Once | ORAL | 0 refills | Status: AC

## 2020-03-27 NOTE — Progress Notes (Signed)
We are sorry that you are not feeling well. Here is how we plan to help! Based on what you shared with me it looks like you: May have a vaginosis due to bacteria and yeast.  Vaginosis is an inflammation of the vagina that can result in discharge, itching and pain. The cause is usually a change in the normal balance of vaginal bacteria or an infection. Vaginosis can also result from reduced estrogen levels after menopause.  The most common causes of vaginosis are:   Bacterial vaginosis which results from an overgrowth of one on several organisms that are normally present in your vagina.   Yeast infections which are caused by a naturally occurring fungus called candida.   Vaginal atrophy (atrophic vaginosis) which results from the thinning of the vagina from reduced estrogen levels after menopause.   Trichomoniasis which is caused by a parasite and is commonly transmitted by sexual intercourse.  Factors that increase your risk of developing vaginosis include: Marland Kitchen Medications, such as antibiotics and steroids . Uncontrolled diabetes . Use of hygiene products such as bubble bath, vaginal spray or vaginal deodorant . Douching . Wearing damp or tight-fitting clothing . Using an intrauterine device (IUD) for birth control . Hormonal changes, such as those associated with pregnancy, birth control pills or menopause . Sexual activity . Having a sexually transmitted infection  Your treatment plan is Metronidazole or Flagyl 500mg  twice a day for 7 days.  I have electronically sent this prescription into the pharmacy that you have chosen. I have also prescribed Diflucan 150 mg tablet by mouth for one dose. Please use over the counter lamisil and hydrocortisone 1% on your thighs.  Be sure to take all of the medication as directed. Stop taking any medication if you develop a rash, tongue swelling or shortness of breath. Mothers who are breast feeding should consider pumping and discarding their breast  milk while on these antibiotics. However, there is no consensus that infant exposure at these doses would be harmful.  Remember that medication creams can weaken latex condoms.   HOME CARE:  Good hygiene may prevent some types of vaginosis from recurring and may relieve some symptoms:  . Avoid baths, hot tubs and whirlpool spas. Rinse soap from your outer genital area after a shower, and dry the area well to prevent irritation. Don't use scented or harsh soaps, such as those with deodorant or antibacterial action. Marland Kitchen Avoid irritants. These include scented tampons and pads. . Wipe from front to back after using the toilet. Doing so avoids spreading fecal bacteria to your vagina.  Other things that may help prevent vaginosis include:  Marland Kitchen Don't douche. Your vagina doesn't require cleansing other than normal bathing. Repetitive douching disrupts the normal organisms that reside in the vagina and can actually increase your risk of vaginal infection. Douching won't clear up a vaginal infection. . Use a latex condom. Both female and female latex condoms may help you avoid infections spread by sexual contact. . Wear cotton underwear. Also wear pantyhose with a cotton crotch. If you feel comfortable without it, skip wearing underwear to bed. Yeast thrives in Marland Kitchen Your symptoms should improve in the next day or two.  GET HELP RIGHT AWAY IF:  . You have pain in your lower abdomen ( pelvic area or over your ovaries) . You develop nausea or vomiting . You develop a fever . Your discharge changes or worsens . You have persistent pain with intercourse . You develop shortness of breath,  a rapid pulse, or you faint.  These symptoms could be signs of problems or infections that need to be evaluated by a medical provider now.  MAKE SURE YOU    Understand these instructions.  Will watch your condition.  Will get help right away if you are not doing well or get worse.  Your e-visit  answers were reviewed by a board certified advanced clinical practitioner to complete your personal care plan. Depending upon the condition, your plan could have included both over the counter or prescription medications. Please review your pharmacy choice to make sure that you have choses a pharmacy that is open for you to pick up any needed prescription, Your safety is important to [place]. If you have drug allergies check your prescription carefully.   You can use MyChart to ask questions about today's visit, request a non-urgent call back, or ask for a work or school excuse for 24 hours related to this e-Visit. If it has been greater than 24 hours you will need to follow up with your provider, or enter a new e-Visit to address those concerns. You will get a MyChart message within the next two days asking about your experience. I hope that your e-visit has been valuable and will speed your recovery. **Please do not respond to this message unless you have follow up questions.** Greater than 5 but less than 10 minutes spent researching, coordinating, and implementing care for this patient today

## 2020-08-29 ENCOUNTER — Other Ambulatory Visit: Payer: Self-pay

## 2020-08-29 ENCOUNTER — Encounter: Payer: Self-pay | Admitting: Obstetrics

## 2020-08-29 ENCOUNTER — Ambulatory Visit (INDEPENDENT_AMBULATORY_CARE_PROVIDER_SITE_OTHER): Payer: Medicaid Other | Admitting: Obstetrics

## 2020-08-29 VITALS — BP 100/60 | Ht 64.0 in | Wt 195.0 lb

## 2020-08-29 DIAGNOSIS — R32 Unspecified urinary incontinence: Secondary | ICD-10-CM

## 2020-08-29 DIAGNOSIS — N3941 Urge incontinence: Secondary | ICD-10-CM | POA: Diagnosis not present

## 2020-08-29 DIAGNOSIS — R3915 Urgency of urination: Secondary | ICD-10-CM

## 2020-08-29 LAB — POCT URINALYSIS DIPSTICK
Bilirubin, UA: NEGATIVE
Blood, UA: NEGATIVE
Glucose, UA: NEGATIVE
Ketones, UA: NEGATIVE
Leukocytes, UA: NEGATIVE
Nitrite, UA: NEGATIVE
Protein, UA: NEGATIVE
Spec Grav, UA: 1.01 (ref 1.010–1.025)
Urobilinogen, UA: 0.2 E.U./dL
pH, UA: 5 (ref 5.0–8.0)

## 2020-08-29 NOTE — Progress Notes (Signed)
Obstetrics & Gynecology Office Visit   Chief Complaint:  Chief Complaint  Patient presents with  . Urinary Incontinence    History of Present Illness: Molly Graves  Is presenting for evaluation of some urinary urgency. Initially she set up an appointment thinking that she had a UTI. She has had both UTIs and pyelonephritis in the past. She is not on suppressive therapy for any chronic urinary infections. Upon her arrival, she states " I can't hold my bladder". She reports that she has had difficulty with incontinence for several years. She experiences urgency to void several times daily, and has difficulty holding her urine. 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 She also shares that intercourse is uncomfortable, with her feeling pain internally with penetration. She reports three SVDs, with one baby weighing almost 9 lbs.  She has not been worked up formally for incontinence.   Review of Systems:  Review of Systems  Constitutional: Negative.   HENT: Negative.   Eyes: Negative.   Respiratory: Negative.   Cardiovascular: Negative.   Gastrointestinal: Negative.   Genitourinary: Positive for urgency.  Musculoskeletal: Negative.   Skin: Negative.   Neurological: Negative.   Endo/Heme/Allergies: Negative.   Psychiatric/Behavioral: Negative.      Past Medical History:  Past Medical History:  Diagnosis Date  . Anemia   . Back pain affecting pregnancy in third trimester 11/17/2018  . Complication of intrauterine device (IUD) (HCC) 04/19/2016  . Depression    post partum depression  . Dysuria 12/13/2018  . Headache    MIGRAINES  . Herpes genitalia   . Normal vaginal delivery 12/25/2018  . Postpartum care following vaginal delivery 12/25/2018  . Pyelonephritis affecting pregnancy in second trimester 08/08/2018  . Supervision of other normal pregnancy, antepartum 05/22/2018    Clinic [facility] Prenatal Labs Dating [place] Blood type: O/Positive/-- (12/23 1605)  Genetic Screen declines Antibody:Negative (12/23 1605) Anatomic [place]  incomplete Rubella: 1.86 (12/23 1605) Varicella:   GTT Third trimester:  RPR: Non Reactive (12/23 1605)  Rhogam O+ HBsAg: Negative (12/23 1605)  TDaP vaccine              Flu Shot: HIV: Non Reactive (12/23 1605)  Baby Food                                  Past Surgical History:  Past Surgical History:  Procedure Laterality Date  . BREAST SURGERY     lumpectomy left breast  . DIAGNOSTIC LAPAROSCOPY  2015   to rule out uterine injury after D&E  . DILATION AND EVACUATION N/A 02/10/2020   Procedure: DILATATION AND EVACUATION;  Surgeon: Natale Milch, MD;  Location: [facility];  Service: Gynecology;  Laterality: N/A;  . FEMUR IM NAIL Left 02/18/2019   Procedure: INTRAMEDULLARY (IM) NAIL FEMORAL;  Surgeon: Vickki Hearing, MD;  Location: [facility];  Service: Orthopedics;  Laterality: Left;  . HYSTEROSCOPY WITH D & C N/A 04/19/2016   Procedure: DILATATION AND CURETTAGE /HYSTEROSCOPY;  Surgeon: Nadara Mustard, MD;  Location: [facility];  Service: Gynecology;  Laterality: N/A;  . INDUCED ABORTION  2015  . IUD REMOVAL N/A 04/19/2016   Procedure: INTRAUTERINE DEVICE (IUD) REMOVAL;  Surgeon: Nadara Mustard, MD;  Location: [facility];  Service: Gynecology;  Laterality: N/A;  . LAPAROSCOPY N/A 04/19/2016   Procedure:  LAPAROSCOPY DIAGNOSTIC;  Surgeon: Nadara Mustard, MD;  Location: [facility];  Service: Gynecology;  Laterality: N/A;    Gynecologic History: No LMP recorded. (Menstrual status: Oral contraceptives).  Obstetric History: [id number]  Family History:  Family History  Problem Relation Age of Onset  . Hypertension Maternal Aunt   . Hypertension Maternal Grandmother   . Cancer Other 29       Cervical, Malignant  . Depression Mother   . Hypertension Mother   . Cancer Maternal Uncle   . Cancer Paternal Aunt     Social History:  Social History    Socioeconomic History  . Marital status: Single    Spouse name: Not on file  . Number of children: 3  . Years of education: Not on file  . Highest education level: Not on file  Occupational History  . Not on file  Tobacco Use  . Smoking status: Current Every Day Smoker    Years: 7.00    Types: Cigars    Last attempt to quit: 04/17/2018    Years since quitting: 2.3  . Smokeless tobacco: Never Used  Vaping Use  . Vaping Use: Former  Substance and Sexual Activity  . Alcohol use: Not Currently  . Drug use: No  . Sexual activity: Yes    Partners: Male    Birth control/protection: None, Pill  Other Topics Concern  . Not on file  Social History Narrative  . Not on file   Social Determinants of Health   Financial Resource Strain: Not on file  Food Insecurity: Not on file  Transportation Needs: Not on file  Physical Activity: Not on file  Stress: Not on file  Social Connections: Not on file  Intimate Partner Violence: Not on file    Allergies:  No Known Allergies  Medications: Prior to Admission medications   Medication Sig Start Date End Date Taking? Authorizing Provider  ELDERBERRY PO Take 1 tablet by mouth daily.   Yes [provider]  FLUoxetine (PROZAC) 40 MG capsule Take 1 capsule (40 mg total) by mouth daily. 02/18/20 02/17/21 Yes Schuman, Christanna R, MD  ibuprofen (ADVIL) 600 MG tablet Take 1 tablet (600 mg total) by mouth every 6 (six) hours as needed. 02/10/20  Yes Schuman, Jaquelyn Bitter, MD  JUNEL 1/20 1-20 MG-MCG tablet Take 1 tablet by mouth daily. 12/16/19  Yes [provider]  Multiple Vitamin (MULTIVITAMIN WITH MINERALS) TABS tablet Take 1 tablet by mouth daily.   Yes [provider]  Ferrous Sulfate (IRON) 325 (65 Fe) MG TABS Take 1 tablet by mouth daily. Patient not taking: Reported on 08/29/2020    [provider]  metroNIDAZOLE (FLAGYL) 500 MG tablet Take 1 tablet (500 mg total) by mouth 2 (two) times daily. Patient not  taking: Reported on 08/29/2020 03/27/20   Arthor Captain, PA-C  norethindrone-ethinyl estradiol (JUNEL FE 1/20) 1-20 MG-MCG tablet Take 1 tablet by mouth daily. Patient not taking: Reported on 08/29/2020 02/18/20   Natale Milch, MD  traMADol (ULTRAM) 50 MG tablet Take 1 tablet (50 mg total) by mouth every 6 (six) hours as needed. Patient not taking: Reported on 08/29/2020 02/10/20 02/09/21  Natale Milch, MD    Physical Exam Vitals:  Vitals:   08/29/20 1004  BP: 100/60   No LMP recorded. (Menstrual status: Oral contraceptives).  Physical Exam Constitutional:      Appearance: Normal appearance. She is obese.  HENT:     Head: Normocephalic and atraumatic.  Cardiovascular:  Rate and Rhythm: Normal rate and regular rhythm.     Pulses: Normal pulses.     Heart sounds: Normal heart sounds.  Pulmonary:     Effort: Pulmonary effort is normal.     Breath sounds: Normal breath sounds.  Abdominal:     Palpations: Abdomen is soft.  Genitourinary:    General: Normal vulva.     Comments: No rashes or leaions. Minimal discharge. Bimanual: reveals cervix is located closer to her introitus- prolapsed. No cystocele necessarily noted. Musculoskeletal:        General: Normal range of motion.     Cervical back: Normal range of motion and neck supple.  Skin:    General: Skin is warm and dry.  Neurological:     General: No focal deficit present.     Mental Status: She is alert and oriented to person, place, and time.  Psychiatric:        Mood and Affect: Mood normal.        Behavior: Behavior normal.      Assessment: 30 y.o. [id number]  With long term urinary urgency and incontinence.     Plan: Problem List Items Addressed This Visit   None   Visit Diagnoses    Urinary incontinence, unspecified type    -  Primary   Relevant Orders   POCT urinalysis dipstick (Completed)   Urine Culture   Urinary urgency       Relevant Orders   Urine Culture   Urgency incontinence        Relevant Orders   Ambulatory referral to Urogynecology   Urine Culture     Discussed her sxs at length. Will review the urine culture to r/o UTI. Referral to urogynecology for evalaution. Discussed possible treatment options for her incontinence might be pessary, pelvic floor rehab, surgery options.  RTC PRN.  Mirna Mires, CNM  08/29/2020 5:42 PM

## 2020-08-31 ENCOUNTER — Encounter: Payer: Self-pay | Admitting: Obstetrics

## 2020-08-31 LAB — URINE CULTURE

## 2020-09-13 ENCOUNTER — Ambulatory Visit: Payer: Medicaid Other | Admitting: Obstetrics

## 2020-10-03 ENCOUNTER — Emergency Department
Admission: EM | Admit: 2020-10-03 | Discharge: 2020-10-03 | Disposition: A | Discharge status: Home / Self Care | Payer: Medicaid Other | Attending: Emergency Medicine | Admitting: Emergency Medicine

## 2020-10-03 ENCOUNTER — Other Ambulatory Visit: Payer: Self-pay

## 2020-10-03 ENCOUNTER — Emergency Department: Payer: Medicaid Other

## 2020-10-03 DIAGNOSIS — R059 Cough, unspecified: Secondary | ICD-10-CM | POA: Insufficient documentation

## 2020-10-03 DIAGNOSIS — R0602 Shortness of breath: Secondary | ICD-10-CM

## 2020-10-03 DIAGNOSIS — R0789 Other chest pain: Secondary | ICD-10-CM | POA: Diagnosis not present

## 2020-10-03 DIAGNOSIS — F17210 Nicotine dependence, cigarettes, uncomplicated: Secondary | ICD-10-CM | POA: Insufficient documentation

## 2020-10-03 DIAGNOSIS — E119 Type 2 diabetes mellitus without complications: Secondary | ICD-10-CM | POA: Diagnosis not present

## 2020-10-03 DIAGNOSIS — R072 Precordial pain: Secondary | ICD-10-CM | POA: Insufficient documentation

## 2020-10-03 DIAGNOSIS — R079 Chest pain, unspecified: Secondary | ICD-10-CM | POA: Diagnosis not present

## 2020-10-03 LAB — CBC WITH DIFFERENTIAL/PLATELET
Abs Immature Granulocytes: 0.03 10*3/uL (ref 0.00–0.07)
Basophils Absolute: 0 10*3/uL (ref 0.0–0.1)
Basophils Relative: 1 %
Eosinophils Absolute: 0.4 10*3/uL (ref 0.0–0.5)
Eosinophils Relative: 5 %
HCT: 38.3 % (ref 36.0–46.0)
Hemoglobin: 12.6 g/dL (ref 12.0–15.0)
Immature Granulocytes: 0 %
Lymphocytes Relative: 27 %
Lymphs Abs: 2.2 10*3/uL (ref 0.7–4.0)
MCH: 28.6 pg (ref 26.0–34.0)
MCHC: 32.9 g/dL (ref 30.0–36.0)
MCV: 86.8 fL (ref 80.0–100.0)
Monocytes Absolute: 0.6 10*3/uL (ref 0.1–1.0)
Monocytes Relative: 7 %
Neutro Abs: 4.9 10*3/uL (ref 1.7–7.7)
Neutrophils Relative %: 60 %
Platelets: 302 10*3/uL (ref 150–400)
RBC: 4.41 MIL/uL (ref 3.87–5.11)
RDW: 12.7 % (ref 11.5–15.5)
WBC: 8.1 10*3/uL (ref 4.0–10.5)
nRBC: 0 % (ref 0.0–0.2)

## 2020-10-03 LAB — COMPREHENSIVE METABOLIC PANEL
ALT: 15 U/L (ref 0–44)
AST: 18 U/L (ref 15–41)
Albumin: 3.5 g/dL (ref 3.5–5.0)
Alkaline Phosphatase: 81 U/L (ref 38–126)
Anion gap: 6 (ref 5–15)
BUN: 9 mg/dL (ref 6–20)
CO2: 24 mmol/L (ref 22–32)
Calcium: 9 mg/dL (ref 8.9–10.3)
Chloride: 107 mmol/L (ref 98–111)
Creatinine, Ser: 0.9 mg/dL (ref 0.44–1.00)
GFR, Estimated: >60 mL/min (ref >60)
Glucose, Bld: 83 mg/dL (ref 70–99)
Potassium: 3.8 mmol/L (ref 3.5–5.1)
Sodium: 137 mmol/L (ref 135–145)
Total Bilirubin: 0.4 mg/dL (ref 0.3–1.2)
Total Protein: 7.1 g/dL (ref 6.5–8.1)

## 2020-10-03 LAB — TROPONIN I (HIGH SENSITIVITY): Troponin I (High Sensitivity): <2 ng/L (ref <18)

## 2020-10-03 MED ORDER — KETOROLAC TROMETHAMINE 30 MG/ML IJ SOLN
15.0000 mg | Freq: Once | INTRAMUSCULAR | Status: AC
  Administered 2020-10-03: 15 mg via INTRAVENOUS
  Filled 2020-10-03: qty 1

## 2020-10-03 MED ORDER — PREDNISONE 50 MG PO TABS: 50.0000 mg | ORAL_TABLET | Freq: Every day | ORAL | 0 refills | Status: AC

## 2020-10-03 MED ORDER — ALBUTEROL SULFATE HFA 108 (90 BASE) MCG/ACT IN AERS: 2.0000 | INHALATION_SPRAY | Freq: Four times a day (QID) | RESPIRATORY_TRACT | 2 refills | Status: DC | PRN

## 2020-10-03 MED ORDER — METHYLPREDNISOLONE SODIUM SUCC 125 MG IJ SOLR
125.0000 mg | Freq: Once | INTRAMUSCULAR | Status: AC
  Administered 2020-10-03: 125 mg via INTRAVENOUS
  Filled 2020-10-03: qty 2

## 2020-10-03 MED ORDER — IPRATROPIUM-ALBUTEROL 0.5-2.5 (3) MG/3ML IN SOLN
3.0000 mL | Freq: Once | RESPIRATORY_TRACT | Status: AC
  Administered 2020-10-03: 3 mL via RESPIRATORY_TRACT
  Filled 2020-10-03: qty 3

## 2020-10-03 NOTE — ED Provider Notes (Signed)
[facility] Emergency Department Provider Note ____________________________________________   Event Date/Time   First MD Initiated Contact with Patient 10/03/20 1519     (approximate)  I have reviewed the triage vital signs and the nursing notes.  HISTORY  Chief Complaint Chest Pain   HPI Rockne MenghiniKeona S Dorvil is a 30 y.o. femalewho presents to the ED for evaluation of chest pain.   Chart review indicates history of obesity, iron deficiency anemia, depression, DM and on combo OCPs.  Patient was seen at the neighboring walk-in clinic this morning and referred to us to rule out cardiac pathology. Patient further reports a smoking history, increasing over the past couple months with her new job and associated stress.  Patient presents to the ED for evaluation of substernal chest aching tightness that has been present constantly since this morning.  She reports a similar syndrome occurring a couple weeks ago, lasting 3-4 days, before self resolving.  She reports recurrence of her symptoms this morning, as she was lifting her 30-year-old child from the bed.  She reports she feels like she was kicked in the chest by a horse.  Denies radiation of the pain.  Denies taking medication for the pain.  Currently reporting 4/10 intensity pain.  Reports associated shortness of breath and nonproductive cough that feels like her "smoker's cough."  Denies increased sputum production, fevers, emesis, abdominal pain, dizziness.   Past Medical History:  Diagnosis Date  . Anemia   . Back pain affecting pregnancy in third trimester 11/17/2018  . Complication of intrauterine device (IUD) (HCC) 04/19/2016  . Depression    post partum depression  . Dysuria 12/13/2018  . Headache    MIGRAINES  . Herpes genitalia   . Normal vaginal delivery 12/25/2018  . Postpartum care following vaginal delivery 12/25/2018  . Pyelonephritis affecting pregnancy in second trimester 08/08/2018  . Supervision of  other normal pregnancy, antepartum 05/22/2018   Clinic [facility] Prenatal Labs Dating [place] Blood type: O/Positive/-- (12/23 1605)  Genetic Screen declines Antibody:Negative (12/23 1605) Anatomic US  incomplete Rubella: 1.86 (12/23 1605) Varicella:   GTT Third trimester:  RPR: Non Reactive (12/23 1605)  Rhogam O+ HBsAg: Negative (12/23 1605)  TDaP vaccine              Flu Shot: HIV: Non Reactive (12/23 1605)  Baby Food                                  Patient Active Problem List   Diagnosis Date Noted  . Retained products of conception following abortion   . Incomplete spontaneous abortion   . S/P ORIF (open reduction internal fixation) fracture IM nail left femur 02/18/2019 03/04/2019  . Femur fracture (HCC) 02/17/2019  . Closed fracture of femur, shaft (HCC) 02/17/2019  . MDD (major depressive disorder) 10/10/2018  . Severe recurrent major depression without psychotic features (HCC) 10/10/2018  . Herpes genitalis 10/02/2018  . Iron deficiency anemia 09/03/2018    Past Surgical History:  Procedure Laterality Date  . BREAST SURGERY     lumpectomy left breast  . DIAGNOSTIC LAPAROSCOPY  2015   to rule out uterine injury after D&E  . DILATION AND EVACUATION N/A 02/10/2020   Procedure: DILATATION AND EVACUATION;  Surgeon: Natale MilchSchuman, Christanna R, MD;  Location: [facility];  Service: Gynecology;  Laterality: N/A;  . FEMUR IM NAIL Left 02/18/2019   Procedure: INTRAMEDULLARY (IM) NAIL FEMORAL;  Surgeon: Romeo AppleHarrison,  Fernande Boyden, MD;  Location: [facility];  Service: Orthopedics;  Laterality: Left;  . HYSTEROSCOPY WITH D & C N/A 04/19/2016   Procedure: DILATATION AND CURETTAGE /HYSTEROSCOPY;  Surgeon: Nadara Mustard, MD;  Location: [facility];  Service: Gynecology;  Laterality: N/A;  . INDUCED ABORTION  2015  . IUD REMOVAL N/A 04/19/2016   Procedure: INTRAUTERINE DEVICE (IUD) REMOVAL;  Surgeon: Nadara Mustard, MD;  Location: [facility];  Service: Gynecology;  Laterality: N/A;  . LAPAROSCOPY N/A 04/19/2016   Procedure:  LAPAROSCOPY DIAGNOSTIC;  Surgeon: Nadara Mustard, MD;  Location: [facility];  Service: Gynecology;  Laterality: N/A;    Prior to Admission medications   Medication Sig Start Date End Date Taking? Authorizing Provider  albuterol (VENTOLIN HFA) 108 (90 Base) MCG/ACT inhaler Inhale 2 puffs into the lungs every 6 (six) hours as needed for wheezing or shortness of breath. 10/03/20  Yes Delton Prairie, MD  predniSONE (DELTASONE) 50 MG tablet Take 1 tablet (50 mg total) by mouth daily for 4 days. 10/03/20 10/07/20 Yes Delton Prairie, MD  ELDERBERRY PO Take 1 tablet by mouth daily.    [provider]  Ferrous Sulfate (IRON) 325 (65 Fe) MG TABS Take 1 tablet by mouth daily. Patient not taking: Reported on 08/29/2020    [provider]  FLUoxetine (PROZAC) 40 MG capsule Take 1 capsule (40 mg total) by mouth daily. 02/18/20 02/17/21  Natale Milch, MD  ibuprofen (ADVIL) 600 MG tablet Take 1 tablet (600 mg total) by mouth every 6 (six) hours as needed. 02/10/20   Natale Milch, MD  JUNEL 1/20 1-20 MG-MCG tablet Take 1 tablet by mouth daily. 12/16/19   [provider]  metroNIDAZOLE (FLAGYL) 500 MG tablet Take 1 tablet (500 mg total) by mouth 2 (two) times daily. Patient not taking: Reported on 08/29/2020 03/27/20   Arthor Captain, PA-C  Multiple Vitamin (MULTIVITAMIN WITH MINERALS) TABS tablet Take 1 tablet by mouth daily.    [provider]  norethindrone-ethinyl estradiol (JUNEL FE 1/20) 1-20 MG-MCG tablet Take 1 tablet by mouth daily. Patient not taking: Reported on 08/29/2020 02/18/20   Natale Milch, MD  traMADol (ULTRAM) 50 MG tablet Take 1 tablet (50 mg total) by mouth every 6 (six) hours as needed. Patient not taking: Reported on 08/29/2020 02/10/20 02/09/21  Natale Milch, MD    Allergies Patient has no known allergies.  Family History  Problem Relation Age of Onset  . Hypertension Maternal Aunt   . Hypertension Maternal Grandmother   . Cancer  Other 29       Cervical, Malignant  . Depression Mother   . Hypertension Mother   . Cancer Maternal Uncle   . Cancer Paternal Aunt     Social History Social History   Tobacco Use  . Smoking status: Current Every Day Smoker    Years: 7.00    Types: Cigars    Last attempt to quit: 04/17/2018    Years since quitting: 2.4  . Smokeless tobacco: Never Used  Vaping Use  . Vaping Use: Former  Substance Use Topics  . Alcohol use: Not Currently  . Drug use: No    Review of Systems  Constitutional: No fever/chills Eyes: No visual changes. ENT: No sore throat. Cardiovascular: Positive for chest pain. Respiratory: Positive for nonproductive cough and shortness of breath. Gastrointestinal: No abdominal pain.  No nausea, no vomiting.  No diarrhea.  No constipation. Genitourinary: Negative for dysuria. Musculoskeletal: Negative for back pain. Skin: Negative for  rash. Neurological: Negative for headaches, focal weakness or numbness.  ____________________________________________   PHYSICAL EXAM:  VITAL SIGNS: Vitals:   10/03/20 1515 10/03/20 1649  BP: 132/72 107/66  Pulse: 65 (!) 58  Resp: 18 14  Temp: 97.6 F (36.4 C)   SpO2: 100% 100%     Constitutional: Alert and oriented. Well appearing and in no acute distress.  Conversational in full sentences.  Obese. Eyes: Conjunctivae are normal. PERRL. EOMI. Head: Atraumatic. Nose: No congestion/rhinnorhea. Mouth/Throat: Mucous membranes are moist.  Oropharynx non-erythematous. Neck: No stridor. No cervical spine tenderness to palpation. Cardiovascular: Normal rate, regular rhythm. Grossly normal heart sounds.  Good peripheral circulation. Respiratory: Normal respiratory effort.  No retractions.  Decreased air movement throughout without focal features, and prolonged expiratory phase.  No wheezing. Gastrointestinal: Soft , nondistended, nontender to palpation. No CVA tenderness. Musculoskeletal: No lower extremity tenderness nor  edema.  No joint effusions. No signs of acute trauma. Reproducible chest pain on palpation midsternally.  No overlying skin changes or signs of trauma. Neurologic:  Normal speech and language. No gross focal neurologic deficits are appreciated. No gait instability noted. Skin:  Skin is warm, dry and intact. No rash noted. Psychiatric: Mood and affect are normal. Speech and behavior are normal.  ____________________________________________   LABS (all labs ordered are listed, but only abnormal results are displayed)  Labs Reviewed  CBC WITH DIFFERENTIAL/PLATELET  COMPREHENSIVE METABOLIC PANEL  POC URINE PREG, ED  TROPONIN I (HIGH SENSITIVITY)  TROPONIN I (HIGH SENSITIVITY)   ____________________________________________  12 Lead EKG  Sinus rhythm with sinus arrhythmia, rate of 72 bpm.  Normal axis and normal intervals.  No evidence of acute ischemia. ____________________________________________  RADIOLOGY  ED MD interpretation: 2 view CXR reviewed by me without evidence of acute cardiopulmonary pathology.  Official radiology report(s): DG Chest 2 View  Result Date: 10/03/2020 CLINICAL DATA:  Substernal chest pain and shortness of breath. EXAM: CHEST - 2 VIEW COMPARISON:  Chest x-ray dated March 27, 2018. FINDINGS: The heart size and mediastinal contours are within normal limits. Both lungs are clear. The visualized skeletal structures are unremarkable. IMPRESSION: No active cardiopulmonary disease. Electronically Signed   By: Obie Dredge M.D.   On: 10/03/2020 16:03    ____________________________________________   PROCEDURES and INTERVENTIONS  Procedure(s) performed (including Critical Care):  .1-3 Lead EKG Interpretation Performed by: Delton Prairie, MD Authorized by: Delton Prairie, MD     Interpretation: normal     ECG rate:  66   ECG rate assessment: normal     Rhythm: sinus rhythm     Ectopy: none     Conduction: normal      Medications   ipratropium-albuterol (DUONEB) 0.5-2.5 (3) MG/3ML nebulizer solution 3 mL (3 mLs Nebulization Given 10/03/20 1610)  ketorolac (TORADOL) 30 MG/ML injection 15 mg (15 mg Intravenous Given 10/03/20 1608)  methylPREDNISolone sodium succinate (SOLU-MEDROL) 125 mg/2 mL injection 125 mg (125 mg Intravenous Given 10/03/20 1610)    ____________________________________________   MDM / ED COURSE   Is a 30 year old smoker presents to the ED with atypical chest pains and shortness of breath, possibly MSK versus pulmonary wheezing in etiology, and amenable to outpatient management.  Normal vitals on room air.  Exam reassuring without distress, signs of neurologic or vascular deficits.  She does have some decreased airflow on pulmonary auscultation, without significant wheezing or further focal features.  CXR without infiltrates and patient has had no increase sputum production or fevers to suggest infectious etiology of her symptoms.  She  is tender on examination without rash or skin findings.  Suspect MSK etiology versus pulmonary.  EKG is nonischemic and troponin is negative.  Revolving symptoms after NSAID and breathing treatments/steroids.  We discussed empiric short course of steroids with new prescription for albuterol, as well as utilizing Tylenol/NSAIDs at home for her symptoms.  We discussed return precautions to the ED prior to discharge.   Clinical Course as of 10/03/20 1811  Tue Oct 03, 2020  1711 Reassessed.  Patient reports improving symptoms.  We discussed return precautions for the ED and outpatient management. [DS]    Clinical Course User Index [DS] Delton Prairie, MD    ____________________________________________   FINAL CLINICAL IMPRESSION(S) / ED DIAGNOSES  Final diagnoses:  Other chest pain  Shortness of breath     ED Discharge Orders         Ordered    predniSONE (DELTASONE) 50 MG tablet  Daily        10/03/20 1712    albuterol (VENTOLIN HFA) 108 (90 Base) MCG/ACT inhaler   Every 6 hours PRN        10/03/20 1712           Fitz Matsuo   Note:  This document was prepared using Dragon voice recognition software and may include unintentional dictation errors.   Delton Prairie, MD 10/03/20 [phone]

## 2020-10-03 NOTE — ED Triage Notes (Signed)
Pt comes via POV with c/o central CP. Pt states this started early this month.  Pt states some dizziness. Pt states some SOB that is starting.

## 2020-10-25 ENCOUNTER — Ambulatory Visit: Payer: Medicaid Other | Admitting: Obstetrics and Gynecology

## 2020-12-28 ENCOUNTER — Telehealth: Payer: Self-pay

## 2020-12-28 NOTE — Telephone Encounter (Signed)
Attempt made to contact Molly Graves is a 30 y.o. female re: New pt Pre appt call to collect history information.  -Allergy -Medication -Confirm pharmacy -OB history   Pt was not available. LM on the VM for the patient to call back

## 2021-01-03 ENCOUNTER — Ambulatory Visit: Payer: Medicaid Other | Admitting: Obstetrics and Gynecology

## 2021-01-03 DIAGNOSIS — F329 Major depressive disorder, single episode, unspecified: Secondary | ICD-10-CM | POA: Diagnosis not present

## 2021-01-03 DIAGNOSIS — R5383 Other fatigue: Secondary | ICD-10-CM | POA: Diagnosis not present

## 2021-01-03 DIAGNOSIS — F419 Anxiety disorder, unspecified: Secondary | ICD-10-CM | POA: Diagnosis not present

## 2021-01-21 ENCOUNTER — Emergency Department
Admission: EM | Admit: 2021-01-21 | Discharge: 2021-01-22 | Discharge status: Against Medical Advice | Payer: Medicaid Other

## 2021-01-21 NOTE — ED Notes (Signed)
No answer when called for triage 

## 2021-01-25 DIAGNOSIS — M7989 Other specified soft tissue disorders: Secondary | ICD-10-CM | POA: Diagnosis not present

## 2021-01-25 DIAGNOSIS — M79652 Pain in left thigh: Secondary | ICD-10-CM | POA: Diagnosis not present

## 2021-03-03 ENCOUNTER — Other Ambulatory Visit: Payer: Self-pay | Admitting: Obstetrics and Gynecology

## 2021-03-03 DIAGNOSIS — F321 Major depressive disorder, single episode, moderate: Secondary | ICD-10-CM

## 2021-03-07 ENCOUNTER — Telehealth: Payer: Medicaid Other | Admitting: Physician Assistant

## 2021-03-07 DIAGNOSIS — R197 Diarrhea, unspecified: Secondary | ICD-10-CM

## 2021-03-07 DIAGNOSIS — R42 Dizziness and giddiness: Secondary | ICD-10-CM

## 2021-03-07 NOTE — Progress Notes (Signed)
Based on what you shared with me, I feel your condition warrants further evaluation and I recommend that you be seen in a face to face visit. Giving duration and severity of diarrhea along with symptoms of dehydration (lightheadedness/dizziness), you need in person evaluation at nearest Urgent Care.    NOTE: There will be NO CHARGE for this eVisit   If you are having a true medical emergency please call 911.      For an urgent face to face visit, [place] has six urgent care centers for your convenience:     [facility] Health Urgent [facility] Center at [facility] Directions [phone] [address] [place], [place] [address]    [facility] Health Urgent Care Center [facility]) Get Driving Directions [phone] [address] [place], [place] [address]  [facility] Health Urgent Care Center [facility] [place]) Get Driving Directions [phone] [address] [place],  [place]  [address]  [facility] Health Urgent Care at [facility] Get Driving Directions [phone] [address] [place], [place] [address]   [facility] Health Urgent Care at [facility] Get Driving Directions  [phone] [address] [place], [place] [address]   [facility] Health Urgent Care at [place] Health Directions [phone] [address] [place], [place] [address]  Your MyChart E-visit questionnaire answers were reviewed by a board certified advanced clinical practitioner to complete your personal care plan based on your specific symptoms.  Thank you for using e-Visits.

## 2021-03-08 NOTE — Telephone Encounter (Signed)
Pt calling for refill of prozac.  [phone] ok to leave message.

## 2021-03-08 NOTE — Telephone Encounter (Signed)
Left detailed "medication has been refilled".

## 2021-03-14 DIAGNOSIS — N760 Acute vaginitis: Secondary | ICD-10-CM | POA: Diagnosis not present

## 2021-03-14 DIAGNOSIS — B9689 Other specified bacterial agents as the cause of diseases classified elsewhere: Secondary | ICD-10-CM | POA: Diagnosis not present

## 2021-03-21 ENCOUNTER — Ambulatory Visit: Payer: Medicaid Other | Admitting: Obstetrics and Gynecology

## 2021-04-05 ENCOUNTER — Ambulatory Visit: Admit: 2021-04-05 | Discharge status: Absent without leave | Payer: Medicaid Other

## 2021-05-16 ENCOUNTER — Telehealth: Payer: Medicaid Other | Admitting: Family

## 2021-05-16 DIAGNOSIS — R109 Unspecified abdominal pain: Secondary | ICD-10-CM

## 2021-05-16 DIAGNOSIS — N898 Other specified noninflammatory disorders of vagina: Secondary | ICD-10-CM

## 2021-05-16 NOTE — Progress Notes (Signed)
Based on what you shared with me, I feel your condition warrants further evaluation and I recommend that you be seen in a face to face visit.  Given you are having vaginal discharge and abdominal pain, you need to be seen in person for further testing to rule out a more serious infection.    NOTE: There will be NO CHARGE for this eVisit   If you are having a true medical emergency please call 911.      For an urgent face to face visit, [facility] has six urgent care centers for your convenience:     [facility] at [facility] Directions [phone] [address] [place], [place] [address]    [facility]) Get Driving Directions [phone] [address] [place], [place] [address]  [facility]) Get Driving Directions [phone] [address] [place],  [place]  [address]  [facility] at [facility] Get Driving Directions [phone] [address], [address] [place], [place] [address]   [facility] at [facility] Get Driving Directions  [phone] [address] [place], [place] [address]   [facility] Urgent Care at [facility] Directions [phone] [address], [address] [place], [place] [address]  Your MyChart E-visit questionnaire answers were reviewed by a board certified advanced clinical practitioner to complete your personal care plan based on your specific symptoms.  Thank you for using e-Visits.

## 2021-06-03 ENCOUNTER — Emergency Department
Admission: EM | Admit: 2021-06-03 | Discharge: 2021-06-03 | Disposition: A | Discharge status: Home / Self Care | Payer: Medicaid Other | Attending: Student in an Organized Health Care Education/Training Program | Admitting: Student in an Organized Health Care Education/Training Program

## 2021-06-03 ENCOUNTER — Other Ambulatory Visit: Payer: Self-pay

## 2021-06-03 ENCOUNTER — Encounter: Payer: Self-pay | Admitting: Oncology

## 2021-06-03 DIAGNOSIS — F17290 Nicotine dependence, other tobacco product, uncomplicated: Secondary | ICD-10-CM | POA: Diagnosis not present

## 2021-06-03 DIAGNOSIS — Z20822 Contact with and (suspected) exposure to covid-19: Secondary | ICD-10-CM | POA: Insufficient documentation

## 2021-06-03 DIAGNOSIS — J40 Bronchitis, not specified as acute or chronic: Secondary | ICD-10-CM | POA: Insufficient documentation

## 2021-06-03 DIAGNOSIS — R059 Cough, unspecified: Secondary | ICD-10-CM | POA: Diagnosis present

## 2021-06-03 LAB — RESP PANEL BY RT-PCR (FLU A&B, COVID) ARPGX2
Influenza A by PCR: NEGATIVE
Influenza B by PCR: NEGATIVE
SARS Coronavirus 2 by RT PCR: NEGATIVE

## 2021-06-03 MED ORDER — PREDNISONE 20 MG PO TABS
60.0000 mg | ORAL_TABLET | Freq: Once | ORAL | Status: AC
  Administered 2021-06-03: 23:00:00 60 mg via ORAL
  Filled 2021-06-03: qty 3

## 2021-06-03 MED ORDER — PREDNISONE 10 MG (21) PO TBPK: ORAL_TABLET | ORAL | 0 refills | Status: DC

## 2021-06-03 MED ORDER — AZITHROMYCIN 250 MG PO TABS: ORAL_TABLET | ORAL | 0 refills | Status: DC

## 2021-06-03 NOTE — ED Provider Notes (Signed)
[facility]-EMERGENCY DEPARTMENT  ____________________________________________  Time seen: Approximately 10:49 PM  I have reviewed the triage vital signs and the nursing notes.   HISTORY  Chief Complaint Nasal Congestion   Historian Patient     HPI Molly Graves is a 30 y.o. female presents to the emergency department with cough and nasal congestion that has worsened over the past 2 to 3 days.  Patient denies fever and chills at home.  She states that she has been taking Mucinex for cough.  No chest pain, chest tightness or shortness of breath.  No nausea, vomiting or abdominal pain.   Past Medical History:  Diagnosis Date   Anemia    Back pain affecting pregnancy in third trimester 11/17/2018   Complication of intrauterine device (IUD) (HCC) 04/19/2016   Depression    post partum depression   Dysuria 12/13/2018   Headache    MIGRAINES   Herpes genitalia    Normal vaginal delivery 12/25/2018   Postpartum care following vaginal delivery 12/25/2018   Pyelonephritis affecting pregnancy in second trimester 08/08/2018   Supervision of other normal pregnancy, antepartum 05/22/2018   [facility] Prenatal Labs Dating [place] Blood type: O/Positive/-- (12/23 1605)  Genetic Screen declines Antibody:Negative (12/23 1605) Anatomic [place]  incomplete Rubella: 1.86 (12/23 1605) Varicella:   GTT Third trimester:  RPR: Non Reactive (12/23 1605)  Rhogam O+ HBsAg: Negative (12/23 1605)  TDaP vaccine              Flu Shot: HIV: Non Reactive (12/23 1605)  Baby Food                                   Immunizations up to date:  Yes.     Past Medical History:  Diagnosis Date   Anemia    Back pain affecting pregnancy in third trimester 11/17/2018   Complication of intrauterine device (IUD) (HCC) 04/19/2016   Depression    post partum depression   Dysuria 12/13/2018   Headache    MIGRAINES   Herpes genitalia    Normal vaginal delivery 12/25/2018   Postpartum care following vaginal delivery 12/25/2018    Pyelonephritis affecting pregnancy in second trimester 08/08/2018   Supervision of other normal pregnancy, antepartum 05/22/2018   [facility] Prenatal Labs Dating [place] Blood type: O/Positive/-- (12/23 1605)  Genetic Screen declines Antibody:Negative (12/23 1605) Anatomic [place]  incomplete Rubella: 1.86 (12/23 1605) Varicella:   GTT Third trimester:  RPR: Non Reactive (12/23 1605)  Rhogam O+ HBsAg: Negative (12/23 1605)  TDaP vaccine              Flu Shot: HIV: Non Reactive (12/23 1605)  Baby Food                                  Patient Active Problem List   Diagnosis Date Noted   Retained products of conception following abortion    Incomplete spontaneous abortion    S/P ORIF (open reduction internal fixation) fracture IM nail left femur 02/18/2019 03/04/2019   Femur fracture (HCC) 02/17/2019   Closed fracture of femur, shaft (HCC) 02/17/2019   MDD (major depressive disorder) 10/10/2018   Severe recurrent major depression without psychotic features (HCC) 10/10/2018   Herpes genitalis 10/02/2018   Iron deficiency anemia 09/03/2018    Past Surgical History:  Procedure Laterality Date   BREAST SURGERY  lumpectomy left breast   DIAGNOSTIC LAPAROSCOPY  2015   to rule out uterine injury after D&E   DILATION AND EVACUATION N/A 02/10/2020   Procedure: DILATATION AND EVACUATION;  Surgeon: Natale Milch, MD;  Location: [facility] ORS;  Service: Gynecology;  Laterality: N/A;   FEMUR IM NAIL Left 02/18/2019   Procedure: INTRAMEDULLARY (IM) NAIL FEMORAL;  Surgeon: Vickki Hearing, MD;  Location: [facility];  Service: Orthopedics;  Laterality: Left;   HYSTEROSCOPY WITH D & C N/A 04/19/2016   Procedure: DILATATION AND CURETTAGE /HYSTEROSCOPY;  Surgeon: Nadara Mustard, MD;  Location: [facility] ORS;  Service: Gynecology;  Laterality: N/A;   INDUCED ABORTION  2015   IUD REMOVAL N/A 04/19/2016   Procedure: INTRAUTERINE DEVICE (IUD) REMOVAL;  Surgeon: Nadara Mustard, MD;  Location: [facility] ORS;  Service:  Gynecology;  Laterality: N/A;   LAPAROSCOPY N/A 04/19/2016   Procedure: LAPAROSCOPY DIAGNOSTIC;  Surgeon: Nadara Mustard, MD;  Location: [facility] ORS;  Service: Gynecology;  Laterality: N/A;    Prior to Admission medications   Medication Sig Start Date End Date Taking? Authorizing Provider  azithromycin (ZITHROMAX) 250 MG tablet Take 2 tablets the first day.  Take 1 tablet once daily on days 2 through 5. 06/03/21  Yes Joseph Art, Lockie Pares M, PA-C  predniSONE (STERAPRED UNI-PAK 21 TAB) 10 MG (21) TBPK tablet 6,5,4,3,2,1 06/03/21  Yes Joseph Art, Lockie Pares M, PA-C  FLUoxetine (PROZAC) 40 MG capsule TAKE 1 CAPSULE BY MOUTH EVERY DAY 03/08/21   Natale Milch, MD    Allergies Patient has no known allergies.  Family History  Problem Relation Age of Onset   Hypertension Maternal Aunt    Hypertension Maternal Grandmother    Cancer Other 29       Cervical, Malignant   Depression Mother    Hypertension Mother    Cancer Maternal Uncle    Cancer Paternal Aunt     Social History Social History   Tobacco Use   Smoking status: Every Day    Types: Cigars    Last attempt to quit: 04/17/2018    Years since quitting: 3.1   Smokeless tobacco: Never  Vaping Use   Vaping Use: Every day   Substances: CBD  Substance Use Topics   Alcohol use: Not Currently    Alcohol/week: 1.0 standard drink    Types: 1 Glasses of wine per week    Comment: occ   Drug use: No     Review of Systems  Constitutional: No fever/chills Eyes:  No discharge ENT: Patient has nasal congestion.  Respiratory: Patient has cough.  Gastrointestinal:   No nausea, no vomiting.  No diarrhea.  No constipation. Musculoskeletal: Negative for musculoskeletal pain. Skin: Negative for rash, abrasions, lacerations, ecchymosis.   ____________________________________________   PHYSICAL EXAM:  VITAL SIGNS: ED Triage Vitals  Enc Vitals Group     BP 06/03/21 2054 113/68     Pulse Rate 06/03/21 2054 (!) 106     Resp 06/03/21 2054 18      Temp 06/03/21 2054 98.4 F (36.9 C)     Temp Source 06/03/21 2054 Oral     SpO2 06/03/21 2054 98 %     Weight 06/03/21 2051 180 lb (81.6 kg)     Height 06/03/21 2051 5\' 3"  (1.6 m)     Head Circumference --      Peak Flow --      Pain Score 06/03/21 2051 5     Pain Loc --      Pain Edu? --  Excl. in GC? --      Constitutional: Alert and oriented. Patient is lying supine. Eyes: Conjunctivae are normal. PERRL. EOMI. Head: Atraumatic. ENT:      Ears: Tympanic membranes are mildly injected with mild effusion bilaterally.       Nose: No congestion/rhinnorhea.      Mouth/Throat: Mucous membranes are moist. Posterior pharynx is mildly erythematous.  Hematological/Lymphatic/Immunilogical: No cervical lymphadenopathy.  Cardiovascular: Normal rate, regular rhythm. Normal S1 and S2.  Good peripheral circulation. Respiratory: Normal respiratory effort without tachypnea or retractions. Lungs CTAB. Good air entry to the bases with no decreased or absent breath sounds. Gastrointestinal: Bowel sounds 4 quadrants. Soft and nontender to palpation. No guarding or rigidity. No palpable masses. No distention. No CVA tenderness. Musculoskeletal: Full range of motion to all extremities. No gross deformities appreciated. Neurologic:  Normal speech and language. No gross focal neurologic deficits are appreciated.  Skin:  Skin is warm, dry and intact. No rash noted. Psychiatric: Mood and affect are normal. Speech and behavior are normal. Patient exhibits appropriate insight and judgement.   ____________________________________________   LABS (all labs ordered are listed, but only abnormal results are displayed)  Labs Reviewed  RESP PANEL BY RT-PCR (FLU A&B, COVID) [id number]   ____________________________________________  EKG   ____________________________________________  RADIOLOGY  No results found.  ____________________________________________    PROCEDURES  Procedure(s)  performed:     Procedures     Medications  predniSONE (DELTASONE) tablet 60 mg (has no administration in time range)     ____________________________________________   INITIAL IMPRESSION / ASSESSMENT AND PLAN / ED COURSE  Pertinent labs & imaging results that were available during my care of the patient were reviewed by me and considered in my medical decision making (see chart for details).    Assessment and plan Bronchitis 30 year old female presents to the emergency department with cough and nasal congestion for the past several days.  We will treat patient for bronchitis with tapered prednisone and azithromycin.  She tested negative for COVID and flu during this emergency department encounter.  Return precautions were given to return with new or worsening symptoms.      ____________________________________________  FINAL CLINICAL IMPRESSION(S) / ED DIAGNOSES  Final diagnoses:  Bronchitis      NEW MEDICATIONS STARTED DURING THIS VISIT:  ED Discharge Orders          Ordered    predniSONE (STERAPRED UNI-PAK 21 TAB) 10 MG (21) TBPK tablet        06/03/21 2234    azithromycin (ZITHROMAX) 250 MG tablet        06/03/21 2234                This chart was dictated using voice recognition software/Dragon. Despite best efforts to proofread, errors can occur which can change the meaning. Any change was purely unintentional.     Orvil Feil, PA-C 06/03/21 2252    Shaune Pollack, MD 06/03/21 [phone]

## 2021-06-03 NOTE — ED Triage Notes (Signed)
Pt states that she has nasal congestions and body aches for the past few days. Pt denies fevers at home.

## 2021-06-03 NOTE — Discharge Instructions (Signed)
Take tapered steroid as directed. Take azithromycin as directed. 

## 2021-06-04 ENCOUNTER — Telehealth: Payer: Self-pay

## 2021-06-04 NOTE — Telephone Encounter (Signed)
Transition Care Management Unsuccessful Follow-up Telephone Call  Date of discharge and from where:  06/03/2021-[facility]  Attempts:  1st Attempt  Reason for unsuccessful TCM follow-up call:  Unable to reach patient

## 2021-06-05 NOTE — Telephone Encounter (Signed)
Transition Care Management Unsuccessful Follow-up Telephone Call  Date of discharge and from where:  06/03/2021 from [facility]  Attempts:  2nd Attempt  Reason for unsuccessful TCM follow-up call:  No answer/busy

## 2021-07-08 ENCOUNTER — Emergency Department
Admission: EM | Admit: 2021-07-08 | Discharge: 2021-07-08 | Disposition: A | Discharge status: Home / Self Care | Payer: Medicaid Other | Attending: Emergency Medicine | Admitting: Emergency Medicine

## 2021-07-08 ENCOUNTER — Other Ambulatory Visit: Payer: Self-pay

## 2021-07-08 ENCOUNTER — Encounter: Payer: Self-pay | Admitting: Emergency Medicine

## 2021-07-08 DIAGNOSIS — Z3A00 Weeks of gestation of pregnancy not specified: Secondary | ICD-10-CM | POA: Diagnosis not present

## 2021-07-08 DIAGNOSIS — N898 Other specified noninflammatory disorders of vagina: Secondary | ICD-10-CM | POA: Diagnosis not present

## 2021-07-08 DIAGNOSIS — O26891 Other specified pregnancy related conditions, first trimester: Secondary | ICD-10-CM

## 2021-07-08 LAB — WET PREP, GENITAL
Clue Cells Wet Prep HPF POC: NONE SEEN
Sperm: NONE SEEN
Trich, Wet Prep: NONE SEEN
WBC, Wet Prep HPF POC: <10 (ref <10)
Yeast Wet Prep HPF POC: NONE SEEN

## 2021-07-08 LAB — URINALYSIS, COMPLETE (UACMP) WITH MICROSCOPIC
Bilirubin Urine: NEGATIVE
Glucose, UA: NEGATIVE mg/dL
Hgb urine dipstick: NEGATIVE
Ketones, ur: NEGATIVE mg/dL
Leukocytes,Ua: NEGATIVE
Nitrite: NEGATIVE
Protein, ur: NEGATIVE mg/dL
Specific Gravity, Urine: 1.024 (ref 1.005–1.030)
pH: 8 (ref 5.0–8.0)

## 2021-07-08 LAB — CHLAMYDIA/NGC RT PCR (ARMC ONLY)
Chlamydia Tr: NOT DETECTED
N gonorrhoeae: NOT DETECTED

## 2021-07-08 LAB — PREGNANCY, URINE: Preg Test, Ur: POSITIVE — AB

## 2021-07-08 MED ORDER — ONDANSETRON 4 MG PO TBDP: 4.0000 mg | ORAL_TABLET | Freq: Three times a day (TID) | ORAL | 0 refills | Status: DC | PRN

## 2021-07-08 NOTE — ED Triage Notes (Signed)
Pt reports vaginal discharge with a metallic odor for several days, reports its more than a regula yeast infection. Pt also reports wants a pregnancy test.

## 2021-07-08 NOTE — Discharge Instructions (Signed)
You were seen today for vaginal discharge and pregnancy.  Your urine pregnancy test is positive you tested negative for bacterial vaginosis, yeast or trichomonas.  Your urinalysis does not show any evidence of infection.  Your gonorrhea and Chlamydia testing is pending, we will call you with any positive result.  This could just be normal discharge in pregnancy which is increased secondary to hormones.  Please call [facility] for follow-up for initial evaluation of this pregnancy.

## 2021-07-08 NOTE — ED Provider Notes (Signed)
[facility] Provider Note    Event Date/Time   First MD Initiated Contact with Patient 07/08/21 1433     (approximate)   History   Vaginitis   HPI  Molly Graves is a 31 y.o. female presents to the ER today with complaint of pelvic pain, vaginal discharge and odor.  She reports the symptoms started 2 days ago.  She reports the discharge is thin and white and has a metallic odor.  She describes the pelvic pain as achy across her lower abdomen.  She denies urinary urgency, frequency, dysuria or blood in her urine.  She denies fever, chills, nausea, vomiting or low back pain.  She reports her last menstrual period was 05/28/2021.  She did take a home pregnancy test this week and which was positive.  She is [id number].  She is sexually active in a monogamous relationship and denies exposure to STD that she is aware of.      Physical Exam   Triage Vital Signs: ED Triage Vitals  Enc Vitals Group     BP 07/08/21 1414 127/75     Pulse Rate 07/08/21 1414 (!) 107     Resp 07/08/21 1414 18     Temp 07/08/21 1414 98.5 F (36.9 C)     Temp Source 07/08/21 1414 Oral     SpO2 07/08/21 1414 95 %     Weight 07/08/21 1413 180 lb 12.4 oz (82 kg)     Height 07/08/21 1413 5\' 3"  (1.6 m)     Head Circumference --      Peak Flow --      Pain Score 07/08/21 1413 0     Pain Loc --      Pain Edu? --      Excl. in GC? --     Most recent vital signs: Vitals:   07/08/21 1414  BP: 127/75  Pulse: (!) 107  Resp: 18  Temp: 98.5 F (36.9 C)  SpO2: 95%    General: Awake, no distress.  CV:  Tachycardic Resp:  Normal effort.  Abd:  Soft, active bowel sounds.  Tender in bilateral lower quadrants.    ED Results / Procedures / Treatments   Labs    MEDICATIONS ORDERED IN ED: Medications - No data to display   IMPRESSION / MDM / ASSESSMENT AND PLAN / ED COURSE  I reviewed the triage vital signs and the nursing notes.   Differential diagnosis includes, but is not  limited to, bacterial vaginosis, yeast vaginitis, gonorrhea, chlamydia, trichomonas, PID  Urinalysis negative for infection Urine pregnancy test positive, consistent with her home pregnancy test Gonorrhea/chlamydia pending, pt did not want  to wait on results Discussed with patient that this could be normal vaginal discharge that is increased during pregnancy secondary to hormones Wet prep does not show evidence of yeast, bacterial vaginosis or trichomonas Patient refusing pelvic exam despite education that PID and pregnancy could be harmful and possibly result in miscarriage She would like a prescription for Zofran as she has had hyperemesis gravidarum with prior pregnancies although she is experiencing no nausea now She will follow-up with [facility] as an outpatient       FINAL CLINICAL IMPRESSION(S) / ED DIAGNOSES   Final diagnoses:  Vaginal discharge during pregnancy in first trimester     Rx / DC Orders   ED Discharge Orders          Ordered    ondansetron (ZOFRAN-ODT) 4 MG disintegrating tablet  Every 8 hours PRN        07/08/21 1643             Note:  This document was prepared using Dragon voice recognition software and may include unintentional dictation errors.    Lorre Munroe, NP 07/08/21 1645    Chesley Noon, MD 07/08/21 1728

## 2021-07-08 NOTE — ED Notes (Signed)
Pt verbalizes understanding of d/c instructions and follow up. 

## 2021-07-09 ENCOUNTER — Telehealth: Payer: Medicaid Other | Admitting: Physician Assistant

## 2021-07-09 ENCOUNTER — Telehealth: Payer: Self-pay

## 2021-07-09 DIAGNOSIS — N898 Other specified noninflammatory disorders of vagina: Secondary | ICD-10-CM

## 2021-07-09 NOTE — Telephone Encounter (Signed)
Transition Care Management Unsuccessful Follow-up Telephone Call  Date of discharge and from where:  07/08/2021-[facility]  Attempts:  1st Attempt  Reason for unsuccessful TCM follow-up call:  Unable to leave message

## 2021-07-09 NOTE — Progress Notes (Signed)
Based on what you shared with me, I feel your condition warrants further evaluation and I recommend that you be seen in a face to face visit with your gynecologist or at one of our [facility] clinics.  Per the labs with the ER yesterday, there were no concerning findings. Being that you are pregnant, I would recommend to follow up with your OB/GYN for further evaluation. It is possible this is normal vaginal discharge that can occur with pregnancy.    NOTE: There will be NO CHARGE for this eVisit   If you are having a true medical emergency please call 911.    *Center for [facility] at [facility]             [address] [place], [place] [address] [phone] (*Take patients with no insurance)  *Center for [employer] at [employer] [address] [place], [place],  [place]  [address] [phone] (*Take patients with no insurance)  Center for [employer] at [employer]                                                             [address], [address], [place], [place], [address] [phone]  Center for [facility] at [facility] [address], [place], [place], [address] [phone]  Center for [facility] at [facility] [address], [place], [place], [address] [phone]  Center for [facility] at [facility]                                 [address] [place], [place], [place], [address] [phone]  Center for [facility] at [facility]                                    [address] [place], [place], [address] [phone]  Center for [facility] at [facility] [address], [place], [place], [address]                              [facility] [place] [address], [place], [place], [address] [phone]  Your MyChart E-visit questionnaire answers were reviewed by a board certified advanced  clinical practitioner to complete your personal care plan based on your specific symptoms.  Thank you for using e-Visits.   I provided 5 minutes of non face-to-face time during this encounter for chart review and documentation.

## 2021-07-10 NOTE — Telephone Encounter (Signed)
Transition Care Management Follow-up Telephone Call Date of discharge and from where: 07/08/2021 from [facility] How have you been since you were released from the hospital? Pt stated that her symptoms are same. Pt is still having the discharge and nausea.  Any questions or concerns? No  Items Reviewed: Did the pt receive and understand the discharge instructions provided? Yes  Medications obtained and verified? Yes  Other? No  Any new allergies since your discharge? No  Dietary orders reviewed? No Do you have support at home? Yes   Functional Questionnaire: (I = Independent and D = Dependent) ADLs: I Bathing/Dressing- I Meal Prep- I Eating- I Maintaining continence- I Transferring/Ambulation- I Managing Meds- I  Follow up appointments reviewed: PCP Hospital f/u appt confirmed? No  Pt encouraged to reach out to PCP. [facility] f/u appt confirmed? No  Pt encourage to reach out to OBGYN.  Are transportation arrangements needed? No  If their condition worsens, is the pt aware to call PCP or go to the Emergency Dept.? Yes Was the patient provided with contact information for the PCP's office or ED? Yes Was to pt encouraged to call back with questions or concerns? Yes

## 2021-07-10 NOTE — Telephone Encounter (Signed)
Not seen by [facility] PCP.

## 2021-07-11 ENCOUNTER — Emergency Department
Admission: EM | Admit: 2021-07-11 | Discharge: 2021-07-11 | Disposition: A | Discharge status: Home / Self Care | Payer: Medicaid Other | Attending: Emergency Medicine | Admitting: Emergency Medicine

## 2021-07-11 ENCOUNTER — Other Ambulatory Visit: Payer: Self-pay

## 2021-07-11 DIAGNOSIS — O219 Vomiting of pregnancy, unspecified: Secondary | ICD-10-CM | POA: Insufficient documentation

## 2021-07-11 DIAGNOSIS — Z3A01 Less than 8 weeks gestation of pregnancy: Secondary | ICD-10-CM | POA: Diagnosis not present

## 2021-07-11 DIAGNOSIS — O210 Mild hyperemesis gravidarum: Secondary | ICD-10-CM

## 2021-07-11 DIAGNOSIS — O23599 Infection of other part of genital tract in pregnancy, unspecified trimester: Secondary | ICD-10-CM | POA: Diagnosis not present

## 2021-07-11 DIAGNOSIS — B9689 Other specified bacterial agents as the cause of diseases classified elsewhere: Secondary | ICD-10-CM

## 2021-07-11 LAB — COMPREHENSIVE METABOLIC PANEL
ALT: 12 U/L (ref 0–44)
AST: 16 U/L (ref 15–41)
Albumin: 3.6 g/dL (ref 3.5–5.0)
Alkaline Phosphatase: 69 U/L (ref 38–126)
Anion gap: 6 (ref 5–15)
BUN: 11 mg/dL (ref 6–20)
CO2: 23 mmol/L (ref 22–32)
Calcium: 9 mg/dL (ref 8.9–10.3)
Chloride: 106 mmol/L (ref 98–111)
Creatinine, Ser: 0.61 mg/dL (ref 0.44–1.00)
GFR, Estimated: >60 mL/min (ref >60)
Glucose, Bld: 98 mg/dL (ref 70–99)
Potassium: 3.8 mmol/L (ref 3.5–5.1)
Sodium: 135 mmol/L (ref 135–145)
Total Bilirubin: 0.5 mg/dL (ref 0.3–1.2)
Total Protein: 7 g/dL (ref 6.5–8.1)

## 2021-07-11 LAB — WET PREP, GENITAL
Sperm: NONE SEEN
Trich, Wet Prep: NONE SEEN
WBC, Wet Prep HPF POC: <10 (ref <10)
Yeast Wet Prep HPF POC: NONE SEEN

## 2021-07-11 LAB — CHLAMYDIA/NGC RT PCR (ARMC ONLY)
Chlamydia Tr: NOT DETECTED
N gonorrhoeae: NOT DETECTED

## 2021-07-11 LAB — CBC
HCT: 39.9 % (ref 36.0–46.0)
Hemoglobin: 13.2 g/dL (ref 12.0–15.0)
MCH: 28.9 pg (ref 26.0–34.0)
MCHC: 33.1 g/dL (ref 30.0–36.0)
MCV: 87.3 fL (ref 80.0–100.0)
Platelets: 318 10*3/uL (ref 150–400)
RBC: 4.57 MIL/uL (ref 3.87–5.11)
RDW: 13.1 % (ref 11.5–15.5)
WBC: 8.5 10*3/uL (ref 4.0–10.5)
nRBC: 0 % (ref 0.0–0.2)

## 2021-07-11 MED ORDER — SODIUM CHLORIDE 0.9 % IV SOLN
1000.0000 mL | Freq: Once | INTRAVENOUS | Status: AC
  Administered 2021-07-11: 10:00:00 1000 mL via INTRAVENOUS

## 2021-07-11 MED ORDER — ONDANSETRON HCL 4 MG/2ML IJ SOLN
4.0000 mg | Freq: Once | INTRAMUSCULAR | Status: AC
  Administered 2021-07-11: 10:00:00 4 mg via INTRAVENOUS
  Filled 2021-07-11: qty 2

## 2021-07-11 MED ORDER — METRONIDAZOLE 500 MG PO TABS: 500.0000 mg | ORAL_TABLET | Freq: Two times a day (BID) | ORAL | 0 refills | Status: DC

## 2021-07-11 MED ORDER — PROMETHAZINE HCL 12.5 MG PO TABS: 12.5000 mg | ORAL_TABLET | Freq: Four times a day (QID) | ORAL | 0 refills | Status: DC | PRN

## 2021-07-11 NOTE — ED Provider Notes (Signed)
[facility] Provider Note    Event Date/Time   First MD Initiated Contact with Patient 07/11/21 [phone]     (approximate)   History   Emesis During Pregnancy   HPI  Molly Graves is a 31 y.o. female who believes that she is approximately [redacted] weeks pregnant who presents with nausea and vomiting.  Patient reports she has had morning sickness with all of her pregnancies.  She reports that she was seen recently and prescribed ondansetron which is worked for her in the past but she has been waking up in the morning with nausea.  No abdominal pain.  No vaginal bleeding.  Has not seen OB yet     Physical Exam   Triage Vital Signs: ED Triage Vitals [07/11/21 0917]  Enc Vitals Group     BP 128/72     Pulse Rate (!) 59     Resp 16     Temp 98.8 F (37.1 C)     Temp Source Oral     SpO2 100 %     Weight      Height      Head Circumference      Peak Flow      Pain Score      Pain Loc      Pain Edu?      Excl. in GC?     Most recent vital signs: Vitals:   07/11/21 0917 07/11/21 1212  BP: 128/72 108/70  Pulse: (!) 59 64  Resp: 16 16  Temp: 98.8 F (37.1 C)   SpO2: 100% 98%     General: Awake, no distress.  CV:  Good peripheral perfusion.  Resp:  Normal effort.  Abd:  No distention.  No tenderness to palpation, no CVA tenderness Other:  GU exam performed because of foul-smelling discharge per patient, whitish discharge noted, no CMT   ED Results / Procedures / Treatments   Labs (all labs ordered are listed, but only abnormal results are displayed) Labs Reviewed  WET PREP, GENITAL - Abnormal; Notable for the following components:      Result Value   Clue Cells Wet Prep HPF POC PRESENT (*)    All other components within normal limits  CHLAMYDIA/NGC RT PCR ([facility] ONLY)            CBC  COMPREHENSIVE METABOLIC PANEL     EKG     RADIOLOGY     PROCEDURES:  Critical Care performed:   Procedures   MEDICATIONS ORDERED IN  ED: Medications  0.9 %  sodium chloride infusion (0 mLs Intravenous Stopped 07/11/21 1212)  ondansetron (ZOFRAN) injection 4 mg (4 mg Intravenous Given 07/11/21 1021)     IMPRESSION / MDM / ASSESSMENT AND PLAN / ED COURSE  I reviewed the triage vital signs and the nursing notes.  Patient who is approximately [redacted] weeks pregnant presents with complaints of nausea and vomiting, decreased p.o. intake because of this.  She feels dehydrated.  She states this is consistent with prior episodes of morning sickness that she has had  Lab work reviewed and is reassuring, CBC CMP are unremarkable.  Will treat with IV fluids, IV Zofran and reevaluate  Patient feeling significantly better, the prep is consistent with BV, will treat as she is very symptomatic from this.  Will Rx Phenergan for her to try at home, has follow-up with GYN.          FINAL CLINICAL IMPRESSION(S) / ED DIAGNOSES   Final  diagnoses:  Bacterial vaginosis  Morning sickness     Rx / DC Orders   ED Discharge Orders          Ordered    metroNIDAZOLE (FLAGYL) 500 MG tablet  2 times daily after meals        07/11/21 1328    promethazine (PHENERGAN) 12.5 MG tablet  Every 6 hours PRN        07/11/21 1328             Note:  This document was prepared using Dragon voice recognition software and may include unintentional dictation errors.   Jene Every, MD 07/11/21 1331

## 2021-07-11 NOTE — ED Triage Notes (Signed)
Pt c/o hyperemesis with pregnancy, states it has been worse since Saturday, was seen here on Sunday for the same and Rx zofran with no relief.Marland Kitchen

## 2021-07-17 ENCOUNTER — Emergency Department
Admission: EM | Admit: 2021-07-17 | Discharge: 2021-07-17 | Disposition: A | Discharge status: Home / Self Care | Payer: Medicaid Other | Attending: Emergency Medicine | Admitting: Emergency Medicine

## 2021-07-17 ENCOUNTER — Encounter: Payer: Self-pay | Admitting: Emergency Medicine

## 2021-07-17 ENCOUNTER — Other Ambulatory Visit: Payer: Self-pay

## 2021-07-17 DIAGNOSIS — O99281 Endocrine, nutritional and metabolic diseases complicating pregnancy, first trimester: Secondary | ICD-10-CM | POA: Insufficient documentation

## 2021-07-17 DIAGNOSIS — O219 Vomiting of pregnancy, unspecified: Secondary | ICD-10-CM | POA: Diagnosis not present

## 2021-07-17 DIAGNOSIS — E871 Hypo-osmolality and hyponatremia: Secondary | ICD-10-CM | POA: Diagnosis not present

## 2021-07-17 DIAGNOSIS — Z3A01 Less than 8 weeks gestation of pregnancy: Secondary | ICD-10-CM | POA: Insufficient documentation

## 2021-07-17 DIAGNOSIS — O210 Mild hyperemesis gravidarum: Secondary | ICD-10-CM

## 2021-07-17 LAB — CBC
HCT: 43.3 % (ref 36.0–46.0)
Hemoglobin: 14.2 g/dL (ref 12.0–15.0)
MCH: 28.2 pg (ref 26.0–34.0)
MCHC: 32.8 g/dL (ref 30.0–36.0)
MCV: 85.9 fL (ref 80.0–100.0)
Platelets: 314 10*3/uL (ref 150–400)
RBC: 5.04 MIL/uL (ref 3.87–5.11)
RDW: 13 % (ref 11.5–15.5)
WBC: 8 10*3/uL (ref 4.0–10.5)
nRBC: 0 % (ref 0.0–0.2)

## 2021-07-17 LAB — COMPREHENSIVE METABOLIC PANEL
ALT: 12 U/L (ref 0–44)
AST: 19 U/L (ref 15–41)
Albumin: 4 g/dL (ref 3.5–5.0)
Alkaline Phosphatase: 78 U/L (ref 38–126)
Anion gap: 8 (ref 5–15)
BUN: 8 mg/dL (ref 6–20)
CO2: 22 mmol/L (ref 22–32)
Calcium: 9.4 mg/dL (ref 8.9–10.3)
Chloride: 103 mmol/L (ref 98–111)
Creatinine, Ser: 0.71 mg/dL (ref 0.44–1.00)
GFR, Estimated: >60 mL/min (ref >60)
Glucose, Bld: 95 mg/dL (ref 70–99)
Potassium: 3.6 mmol/L (ref 3.5–5.1)
Sodium: 133 mmol/L — ABNORMAL LOW (ref 135–145)
Total Bilirubin: 0.6 mg/dL (ref 0.3–1.2)
Total Protein: 7.5 g/dL (ref 6.5–8.1)

## 2021-07-17 LAB — LIPASE, BLOOD: Lipase: 27 U/L (ref 11–51)

## 2021-07-17 MED ORDER — METOCLOPRAMIDE HCL 10 MG PO TABS: 10.0000 mg | ORAL_TABLET | Freq: Three times a day (TID) | ORAL | 0 refills | Status: DC | PRN

## 2021-07-17 MED ORDER — METOCLOPRAMIDE HCL 5 MG/ML IJ SOLN
10.0000 mg | Freq: Once | INTRAMUSCULAR | Status: AC
  Administered 2021-07-17: 10 mg via INTRAVENOUS
  Filled 2021-07-17: qty 2

## 2021-07-17 MED ORDER — ONDANSETRON HCL 4 MG/2ML IJ SOLN
4.0000 mg | Freq: Once | INTRAMUSCULAR | Status: AC
Start: 2021-07-17 — End: 2021-07-17
  Administered 2021-07-17: 4 mg via INTRAVENOUS
  Filled 2021-07-17: qty 2

## 2021-07-17 MED ORDER — SODIUM CHLORIDE 0.9 % IV SOLN
1000.0000 mL | Freq: Once | INTRAVENOUS | Status: AC
  Administered 2021-07-17: 1000 mL via INTRAVENOUS

## 2021-07-17 NOTE — ED Triage Notes (Signed)
Pt comes into the ED via POV c/o hyperemesis.  Pt seen at [facility] on Saturday and given phenergan.  Pt states it isnt helping.  Pt has not scheduled an appt with OBGYN at this time.  Pt in NAD at this time with even and unlabored respirations.

## 2021-07-17 NOTE — ED Provider Notes (Signed)
[facility] Provider Note    Event Date/Time   First MD Initiated Contact with Patient 07/17/21 1303     (approximate)   History  Morning sickness   HPI  Molly Graves is a 31 y.o. female who presents with complaint of morning sickness.  Patient reports he is approximately [redacted] weeks pregnant.  Was having significant nausea in the morning primarily, feels better at night.  Is making it difficult for her to work.  No fevers or chills.  No abdominal pain.  No vaginal discharge.  No vaginal bleeding     Physical Exam   Triage Vital Signs: ED Triage Vitals [07/17/21 1220]  Enc Vitals Group     BP 110/68     Pulse Rate 76     Resp 18     Temp 99.1 F (37.3 C)     Temp Source Oral     SpO2 98 %     Weight 82 kg (180 lb 12.4 oz)     Height 1.6 m (5\' 3" )     Head Circumference      Peak Flow      Pain Score 0     Pain Loc      Pain Edu?      Excl. in [place]?     Most recent vital signs: Vitals:   07/17/21 1220  BP: 110/68  Pulse: 76  Resp: 18  Temp: 99.1 F (37.3 C)  SpO2: 98%     General: Awake, no distress.  CV:  Good peripheral perfusion.  Resp:  Normal effort.  Abd:  No distention.  Normal exam Other:     ED Results / Procedures / Treatments   Labs (all labs ordered are listed, but only abnormal results are displayed) Labs Reviewed  COMPREHENSIVE METABOLIC PANEL - Abnormal; Notable for the following components:      Result Value   Sodium 133 (*)    All other components within normal limits  LIPASE, BLOOD  CBC  URINALYSIS, ROUTINE W REFLEX MICROSCOPIC     EKG     RADIOLOGY     PROCEDURES:  Critical Care performed:   Procedures   MEDICATIONS ORDERED IN ED: Medications  0.9 %  sodium chloride infusion (0 mLs Intravenous Stopped 07/17/21 1438)  ondansetron (ZOFRAN) injection 4 mg (4 mg Intravenous Given 07/17/21 1334)  metoCLOPramide (REGLAN) injection 10 mg (10 mg Intravenous Given 07/17/21 1438)      IMPRESSION / MDM / ASSESSMENT AND PLAN / ED COURSE  I reviewed the triage vital signs and the nursing notes.    Patient presents with nausea and vomiting as detailed above, abdominal exam is normal.  Vital signs are unremarkable.  Lab work demonstrates mild hyponatremia otherwise normal electrolytes, normal CBC.  Patient recently treated for BV, seen in our emergency department for that.  Reassuring abdominal exam, plan patient treated with IV fluids, IV Zofran and Reglan with significant improvement.        FINAL CLINICAL IMPRESSION(S) / ED DIAGNOSES   Final diagnoses:  Morning sickness     Rx / DC Orders   ED Discharge Orders          Ordered    metoCLOPramide (REGLAN) 10 MG tablet  Every 8 hours PRN,   Status:  Discontinued        07/17/21 1438    metoCLOPramide (REGLAN) 10 MG tablet  Every 8 hours PRN        07/17/21 1502  Note:  This document was prepared using Dragon voice recognition software and may include unintentional dictation errors.   Jene Every, MD 07/17/21 [phone]

## 2021-07-18 ENCOUNTER — Telehealth: Payer: Self-pay | Admitting: *Deleted

## 2021-07-18 NOTE — Telephone Encounter (Signed)
Transition Care Management Follow-up Telephone Call Date of discharge and from where: 07/17/2021 [facility] ED How have you been since you were released from the hospital? "Feeling a little better" Any questions or concerns? No  Items Reviewed: Did the pt receive and understand the discharge instructions provided? Yes  Medications obtained and verified? Yes  Other? No  Any new allergies since your discharge? No  Dietary orders reviewed? No Do you have support at home? Yes    Functional Questionnaire: (I = Independent and D = Dependent) ADLs: I  Bathing/Dressing- I  Meal Prep- I  Eating- I  Maintaining continence- I  Transferring/Ambulation- I  Managing Meds- I  Follow up appointments reviewed:  PCP Hospital f/u appt confirmed? No  PCP not [facility] f/u appt confirmed? No   Are transportation arrangements needed? No  If their condition worsens, is the pt aware to call PCP or go to the Emergency Dept.? Yes Was the patient provided with contact information for the PCP's office or ED? Yes Was to pt encouraged to call back with questions or concerns? Yes

## 2021-07-21 ENCOUNTER — Other Ambulatory Visit: Payer: Self-pay

## 2021-07-21 ENCOUNTER — Emergency Department: Payer: Medicaid Other

## 2021-07-21 ENCOUNTER — Emergency Department
Admission: EM | Admit: 2021-07-21 | Discharge: 2021-07-21 | Disposition: A | Discharge status: Home / Self Care | Payer: Medicaid Other | Attending: Emergency Medicine | Admitting: Emergency Medicine

## 2021-07-21 ENCOUNTER — Encounter: Payer: Self-pay | Admitting: Intensive Care

## 2021-07-21 DIAGNOSIS — R1110 Vomiting, unspecified: Secondary | ICD-10-CM

## 2021-07-21 DIAGNOSIS — O211 Hyperemesis gravidarum with metabolic disturbance: Secondary | ICD-10-CM | POA: Diagnosis not present

## 2021-07-21 DIAGNOSIS — Z3A08 8 weeks gestation of pregnancy: Secondary | ICD-10-CM | POA: Insufficient documentation

## 2021-07-21 DIAGNOSIS — O219 Vomiting of pregnancy, unspecified: Secondary | ICD-10-CM | POA: Diagnosis present

## 2021-07-21 DIAGNOSIS — R112 Nausea with vomiting, unspecified: Secondary | ICD-10-CM

## 2021-07-21 DIAGNOSIS — O210 Mild hyperemesis gravidarum: Secondary | ICD-10-CM

## 2021-07-21 LAB — URINALYSIS, ROUTINE W REFLEX MICROSCOPIC
Bilirubin Urine: NEGATIVE
Glucose, UA: NEGATIVE mg/dL
Hgb urine dipstick: NEGATIVE
Ketones, ur: 15 mg/dL — AB
Leukocytes,Ua: NEGATIVE
Nitrite: NEGATIVE
Specific Gravity, Urine: 1.02 (ref 1.005–1.030)
pH: 7 (ref 5.0–8.0)

## 2021-07-21 LAB — CBC WITH DIFFERENTIAL/PLATELET
Abs Immature Granulocytes: 0.02 10*3/uL (ref 0.00–0.07)
Basophils Absolute: 0 10*3/uL (ref 0.0–0.1)
Basophils Relative: 0 %
Eosinophils Absolute: 0.2 10*3/uL (ref 0.0–0.5)
Eosinophils Relative: 2 %
HCT: 38.3 % (ref 36.0–46.0)
Hemoglobin: 12.9 g/dL (ref 12.0–15.0)
Immature Granulocytes: 0 %
Lymphocytes Relative: 24 %
Lymphs Abs: 1.9 10*3/uL (ref 0.7–4.0)
MCH: 29.1 pg (ref 26.0–34.0)
MCHC: 33.7 g/dL (ref 30.0–36.0)
MCV: 86.5 fL (ref 80.0–100.0)
Monocytes Absolute: 0.6 10*3/uL (ref 0.1–1.0)
Monocytes Relative: 8 %
Neutro Abs: 5 10*3/uL (ref 1.7–7.7)
Neutrophils Relative %: 66 %
Platelets: 294 10*3/uL (ref 150–400)
RBC: 4.43 MIL/uL (ref 3.87–5.11)
RDW: 12.8 % (ref 11.5–15.5)
WBC: 7.7 10*3/uL (ref 4.0–10.5)
nRBC: 0 % (ref 0.0–0.2)

## 2021-07-21 LAB — COMPREHENSIVE METABOLIC PANEL
ALT: 32 U/L (ref 0–44)
AST: 30 U/L (ref 15–41)
Albumin: 3.6 g/dL (ref 3.5–5.0)
Alkaline Phosphatase: 71 U/L (ref 38–126)
Anion gap: 9 (ref 5–15)
BUN: 11 mg/dL (ref 6–20)
CO2: 21 mmol/L — ABNORMAL LOW (ref 22–32)
Calcium: 9 mg/dL (ref 8.9–10.3)
Chloride: 103 mmol/L (ref 98–111)
Creatinine, Ser: 0.87 mg/dL (ref 0.44–1.00)
GFR, Estimated: >60 mL/min (ref >60)
Glucose, Bld: 102 mg/dL — ABNORMAL HIGH (ref 70–99)
Potassium: 3.4 mmol/L — ABNORMAL LOW (ref 3.5–5.1)
Sodium: 133 mmol/L — ABNORMAL LOW (ref 135–145)
Total Bilirubin: 0.5 mg/dL (ref 0.3–1.2)
Total Protein: 7.3 g/dL (ref 6.5–8.1)

## 2021-07-21 LAB — LIPASE, BLOOD: Lipase: 26 U/L (ref 11–51)

## 2021-07-21 LAB — HCG, QUANTITATIVE, PREGNANCY: hCG, Beta Chain, Quant, S: 202218 m[IU]/mL — ABNORMAL HIGH (ref <5)

## 2021-07-21 LAB — MAGNESIUM: Magnesium: 2 mg/dL (ref 1.7–2.4)

## 2021-07-21 MED ORDER — PROMETHAZINE HCL 12.5 MG RE SUPP: 12.5000 mg | Freq: Four times a day (QID) | RECTAL | 0 refills | Status: DC | PRN

## 2021-07-21 MED ORDER — METOCLOPRAMIDE HCL 5 MG/ML IJ SOLN
10.0000 mg | Freq: Once | INTRAMUSCULAR | Status: AC
  Administered 2021-07-21: 10 mg via INTRAVENOUS
  Filled 2021-07-21: qty 2

## 2021-07-21 MED ORDER — SODIUM CHLORIDE 0.9 % IV BOLUS
1000.0000 mL | Freq: Once | INTRAVENOUS | Status: AC
  Administered 2021-07-21: 1000 mL via INTRAVENOUS

## 2021-07-21 MED ORDER — SODIUM CHLORIDE 0.9 % IV SOLN
12.5000 mg | Freq: Once | INTRAVENOUS | Status: AC
  Administered 2021-07-21: 12.5 mg via INTRAVENOUS
  Filled 2021-07-21: qty 12.5

## 2021-07-21 MED ORDER — POTASSIUM CHLORIDE CRYS ER 20 MEQ PO TBCR
40.0000 meq | EXTENDED_RELEASE_TABLET | Freq: Once | ORAL | Status: AC
  Administered 2021-07-21: 40 meq via ORAL
  Filled 2021-07-21: qty 2

## 2021-07-21 NOTE — ED Triage Notes (Signed)
Patient presents with hyperemesis during pregnancy. Reports 7-[redacted] weeks pregnant. Has not been seen by OBGYN yet. Patient reports she has scheduled appointment with [facility].

## 2021-07-21 NOTE — ED Notes (Signed)
Up to b/r, slow cautious steady gait.  

## 2021-07-21 NOTE — ED Notes (Signed)
Urine sent. Nausea remains. Given ginger ale and saltines.

## 2021-07-21 NOTE — ED Notes (Addendum)
Pt presents the to the ED for nausea . Pt also endorses LLQ abd pain. Pt also started on Reglan and Zofran states it helps for a little while but nausea comes right back. Pt states she is approx 7-[redacted] weeks pregnant. LMP 12/12. This is pt's 6th pregnancy.

## 2021-07-21 NOTE — ED Provider Notes (Addendum)
[facility] Provider Note    Event Date/Time   First MD Initiated Contact with Patient 07/21/21 [phone]     (approximate)   History   Emesis During Pregnancy   HPI  Molly Graves is a 31 y.o. female who reports being about [redacted] weeks pregnant who comes in with concerns for nausea and vomiting.  Patient's been here multiple times now for hyperemesis.  She reports some left lower quadrant pain associated with it.  She also reports some right upper quadrant pain.  She reports having hyperemesis in prior pregnancies but nothing this bad.  This is her seventh pregnancy.  She had 3 live births, 2 abortions, 1 miscarriage previously.  She denies any vaginal bleeding.  Denies any chest pain, shortness of breath.  She has been taking Zofran at home without about being able to keep things down.  Physical Exam   Triage Vital Signs: ED Triage Vitals  Enc Vitals Group     BP 07/21/21 0901 119/77     Pulse Rate 07/21/21 0901 82     Resp 07/21/21 0901 16     Temp 07/21/21 0901 98 F (36.7 C)     Temp Source 07/21/21 0901 Oral     SpO2 07/21/21 0901 97 %     Weight 07/21/21 0902 180 lb 12.4 oz (82 kg)     Height 07/21/21 0902 5\' 3"  (1.6 m)     Head Circumference --      Peak Flow --      Pain Score 07/21/21 0901 5     Pain Loc --      Pain Edu? --      Excl. in [place]? --     Most recent vital signs: Vitals:   07/21/21 0901  BP: 119/77  Pulse: 82  Resp: 16  Temp: 98 F (36.7 C)  SpO2: 97%     General: Awake, no distress.  CV:  Good peripheral perfusion.  Resp:  Normal effort.  Abd:  No distention.  Slightly tender in the right upper quadrant and a little bit in the left lower quadrant    ED Results / Procedures / Treatments   Labs (all labs ordered are listed, but only abnormal results are displayed) Labs Reviewed  COMPREHENSIVE METABOLIC PANEL - Abnormal; Notable for the following components:      Result Value   Sodium 133 (*)    Potassium 3.4 (*)     CO2 21 (*)    Glucose, Bld 102 (*)    All other components within normal limits  CBC WITH DIFFERENTIAL/PLATELET  LIPASE, BLOOD  MAGNESIUM  URINALYSIS, ROUTINE W REFLEX MICROSCOPIC  HCG, QUANTITATIVE, PREGNANCY   RADIOLOGY I have reviewed the [place] personally and agree with radiology read normal gallbladder.    PROCEDURES:  Critical Care performed: No  Procedures   MEDICATIONS ORDERED IN ED: Medications  sodium chloride 0.9 % bolus 1,000 mL (0 mLs Intravenous Stopped 07/21/21 1057)  metoCLOPramide (REGLAN) injection 10 mg (10 mg Intravenous Given 07/21/21 0957)  promethazine (PHENERGAN) 12.5 mg in sodium chloride 0.9 % 50 mL IVPB (0 mg Intravenous Stopped 07/21/21 1206)  potassium chloride SA (KLOR-CON M) CR tablet 40 mEq (40 mEq Oral Given 07/21/21 1206)     IMPRESSION / MDM / ASSESSMENT AND PLAN / ED COURSE  I reviewed the triage vital signs and the nursing notes.  Differential diagnosis includes, but is not limited to, most likely hyperemesis secondary to pregnancy but given a little  bit of intermittent left lower quadrant pain will get ultrasound to rule out ectopic as well as ultrasound to rule out gallbladder pathology.  CBC shows no anemia CMP shows slightly low sodium at 133 but getting some repletion as well as slightly low potassium Magnesium is normal  UA with only slight ketones and patient started getting some fluids.  She has been tolerating p.o.  Ultrasounds are negative.  No signs of gallstones, patient has an intrauterine pregnancy at 8 weeks 1 day  We will trial some PR promethazine given the oral stuff is not working and have her follow-up with [facility]    FINAL CLINICAL IMPRESSION(S) / ED DIAGNOSES   Final diagnoses:  Vomiting  Hyperemesis gravidarum     Rx / DC Orders   ED Discharge Orders          Ordered    promethazine (PHENERGAN) 12.5 MG suppository  Every 6 hours PRN        07/21/21 1314             Note:  This document was  prepared using Dragon voice recognition software and may include unintentional dictation errors.   Vanessa Lehigh, MD 07/21/21 1315    Vanessa [place], MD 07/21/21 1315

## 2021-07-21 NOTE — ED Notes (Signed)
Pt provided with a cup at this time. Unable to obtain urine sample at this time.

## 2021-07-21 NOTE — ED Notes (Signed)
Sleeping/ resting, to [place], phenergan infusing.

## 2021-07-21 NOTE — ED Notes (Addendum)
Pt alert, NAD, calm, interactive. Sleepy after reglan. Nausea continues. EDP at [facility]. IVF infusing. Pending phenergan from pharmacy, and [place]. [id number], [redacted] weeks pregnant. Pt of [facility], but has not been seen yet.

## 2021-07-21 NOTE — ED Notes (Signed)
EDP at [facility] 

## 2021-07-21 NOTE — Discharge Instructions (Addendum)
Stop taking the promethazine by mouth and we have prescribed a promethazine to take per rectum instead to help with tolerating drinking but your blood work is otherwise reassuring.  IMPRESSION: 1. Single living intrauterine gestation with an estimated gestational age of [redacted] weeks and 1 day. 2. No complications identified at this time.

## 2021-07-21 NOTE — ED Notes (Signed)
Given ice water per request, no emesis.

## 2021-08-06 ENCOUNTER — Encounter: Payer: Self-pay | Admitting: Oncology

## 2021-08-10 ENCOUNTER — Ambulatory Visit (INDEPENDENT_AMBULATORY_CARE_PROVIDER_SITE_OTHER): Payer: Medicaid Other | Admitting: Obstetrics

## 2021-08-10 ENCOUNTER — Encounter: Payer: Self-pay | Admitting: Obstetrics

## 2021-08-10 ENCOUNTER — Other Ambulatory Visit: Payer: Self-pay

## 2021-08-10 ENCOUNTER — Other Ambulatory Visit (HOSPITAL_COMMUNITY)
Admission: RE | Admit: 2021-08-10 | Discharge: 2021-08-10 | Disposition: A | Discharge status: Home / Self Care | Payer: Medicaid Other | Source: Ambulatory Visit | Attending: Obstetrics | Admitting: Obstetrics

## 2021-08-10 VITALS — BP 122/70 | Ht 63.0 in | Wt 180.0 lb

## 2021-08-10 DIAGNOSIS — R5383 Other fatigue: Secondary | ICD-10-CM | POA: Diagnosis not present

## 2021-08-10 DIAGNOSIS — Z113 Encounter for screening for infections with a predominantly sexual mode of transmission: Secondary | ICD-10-CM | POA: Insufficient documentation

## 2021-08-10 DIAGNOSIS — Z124 Encounter for screening for malignant neoplasm of cervix: Secondary | ICD-10-CM | POA: Diagnosis not present

## 2021-08-10 DIAGNOSIS — Z30011 Encounter for initial prescription of contraceptive pills: Secondary | ICD-10-CM

## 2021-08-10 MED ORDER — NORETHIN ACE-ETH ESTRAD-FE 1-20 MG-MCG PO TABS
1.0000 | ORAL_TABLET | Freq: Every day | ORAL | 3 refills | Status: DC
Start: 2021-08-10 — End: 2022-03-18

## 2021-08-10 NOTE — Progress Notes (Signed)
Gynecology Annual Exam   PCP: Ellene Route  Chief Complaint:  Chief Complaint  Patient presents with   Annual Exam    Pt just had pregnancy termination. Needing pap and Birth Control.     History of Present Illness: Patient is a 31 y.o. [id number] presents for annual exam. The patient reports vaginal bleeding today.   LMP: Patient's last menstrual period was 05/28/2021. Average Interval: regular, 28 days Duration of flow: 6 days Heavy Menses: no Clots: no Intermenstrual Bleeding: no Postcoital Bleeding: no Dysmenorrhea: no  The patient is sexually active. She recently had a termination of pregnancy and plans OCPs for contraception. She denies dyspareunia.  The patient does perform self breast exams.  There is no notable family history of breast or ovarian cancer in her family.  The patient wears seatbelts: yes.   The patient cleans for regular exercise.     Review of Systems: Review of Systems  Constitutional: Negative.   HENT: Negative.    Eyes: Negative.   Respiratory: Negative.    Cardiovascular: Negative.   Gastrointestinal: Negative.   Genitourinary: Negative.   Musculoskeletal: Negative.   Skin: Negative.   Neurological: Negative.   Endo/Heme/Allergies: Negative.   Psychiatric/Behavioral: Negative.    Reproductive: Reports current light-moderate bleeding following termination of pregnancy.   Past Medical History:  Patient Active Problem List   Diagnosis Date Noted   Retained products of conception following abortion    Incomplete spontaneous abortion    S/P ORIF (open reduction internal fixation) fracture IM nail left femur 02/18/2019 03/04/2019   Femur fracture ([place]) 02/17/2019   Closed fracture of femur, shaft ([place]) 02/17/2019   MDD (major depressive disorder) 10/10/2018   Severe recurrent major depression without psychotic features ([place]) 10/10/2018   Herpes genitalis 10/02/2018   Iron deficiency anemia 09/03/2018    Past Surgical History:   Past Surgical History:  Procedure Laterality Date   BREAST SURGERY     lumpectomy left breast   DIAGNOSTIC LAPAROSCOPY  2015   to rule out uterine injury after D&E   DILATION AND EVACUATION N/A 02/10/2020   Procedure: DILATATION AND EVACUATION;  Surgeon: Homero Fellers, MD;  Location: [facility] ORS;  Service: Gynecology;  Laterality: N/A;   FEMUR IM NAIL Left 02/18/2019   Procedure: INTRAMEDULLARY (IM) NAIL FEMORAL;  Surgeon: Carole Civil, MD;  Location: [facility];  Service: Orthopedics;  Laterality: Left;   HYSTEROSCOPY WITH D & C N/A 04/19/2016   Procedure: DILATATION AND CURETTAGE /HYSTEROSCOPY;  Surgeon: Gae Dry, MD;  Location: [facility] ORS;  Service: Gynecology;  Laterality: N/A;   INDUCED ABORTION  2015   IUD REMOVAL N/A 04/19/2016   Procedure: INTRAUTERINE DEVICE (IUD) REMOVAL;  Surgeon: Gae Dry, MD;  Location: [facility] ORS;  Service: Gynecology;  Laterality: N/A;   LAPAROSCOPY N/A 04/19/2016   Procedure: LAPAROSCOPY DIAGNOSTIC;  Surgeon: Gae Dry, MD;  Location: [facility] ORS;  Service: Gynecology;  Laterality: N/A;    Gynecologic History:  Patient's last menstrual period was 05/28/2021. Contraception: recent termination of pregnancy, prescribed OCPs today.  Last Pap: 04/2019 Results were: NIL and HR HPV negative   Obstetric History: [id number]  Family History:  Family History  Problem Relation Age of Onset   Hypertension Maternal Aunt    Hypertension Maternal Grandmother    Cancer Other 29       Cervical, Malignant   Depression Mother    Hypertension Mother    Cancer Maternal Uncle    Cancer Paternal Aunt  Social History:  Social History   Socioeconomic History   Marital status: Single    Spouse name: Not on file   Number of children: 3   Years of education: Not on file   Highest education level: Not on file  Occupational History   Not on file  Tobacco Use   Smoking status: Every Day    Types: Cigars    Last attempt to quit: 04/17/2018     Years since quitting: 3.3   Smokeless tobacco: Never  Vaping Use   Vaping Use: Every day   Substances: CBD  Substance and Sexual Activity   Alcohol use: Not Currently    Alcohol/week: 1.0 standard drink    Types: 1 Glasses of wine per week    Comment: occ   Drug use: No   Sexual activity: Not Currently    Partners: Male  Other Topics Concern   Not on file  Social History Narrative   Not on file   Social Determinants of Health   Financial Resource Strain: Not on file  Food Insecurity: Not on file  Transportation Needs: Not on file  Physical Activity: Not on file  Stress: Not on file  Social Connections: Not on file  Intimate Partner Violence: Not on file    Allergies:  No Known Allergies  Medications: Prior to Admission medications   Medication Sig Start Date End Date Taking? Authorizing Provider  FLUoxetine (PROZAC) 40 MG capsule TAKE 1 CAPSULE BY MOUTH EVERY DAY 03/08/21  Yes Schuman, Christanna R, MD  metroNIDAZOLE (FLAGYL) 500 MG tablet Take 1 tablet (500 mg total) by mouth 2 (two) times daily after a meal. 07/11/21  Yes Lavonia Drafts, MD  metoCLOPramide (REGLAN) 10 MG tablet Take 1 tablet (10 mg total) by mouth every 8 (eight) hours as needed for up to 10 days for nausea. 07/17/21 07/27/21  Versie Starks, PA-C    Physical Exam Vitals: Blood pressure 122/70, height 5\' 3"  (1.6 m), weight 180 lb (81.6 kg), last menstrual period 05/28/2021, unknown if currently breastfeeding.  General: NAD HEENT: normocephalic, anicteric Thyroid: no enlargement, no palpable nodules Pulmonary: No increased work of breathing, CTAB Cardiovascular: RRR, distal pulses 2+ Breast: Breast symmetrical, no tenderness, no palpable nodules or masses, no skin or nipple retraction present, no nipple discharge.  No axillary or supraclavicular lymphadenopathy. Abdomen: NABS, soft, non-tender, non-distended.  Umbilicus without lesions.  No hepatomegaly, splenomegaly or masses palpable. No evidence of  hernia  Genitourinary:  External: Normal external female genitalia.  Normal urethral meatus, normal Bartholin's and Skene's glands.    Vagina: Normal vaginal mucosa, no evidence of prolapse.    Cervix: Grossly normal in appearance, scant to moderate bleeding noted (recent EAB)  Uterus: Non-enlarged, mobile, normal contour.  No CMT  Adnexa: ovaries non-enlarged, no adnexal masses  Rectal: deferred  Lymphatic: no evidence of inguinal lymphadenopathy Extremities: no edema, erythema, or tenderness Neurologic: Grossly intact Psychiatric: mood appropriate, affect full  Female chaperone present for pelvic and breast  portions of the physical exam   Assessment: 31 y.o. [id number] routine annual exam S/P EAB  Plan: Problem List Items Addressed This Visit   None Visit Diagnoses     Other fatigue    -  Primary   Relevant Orders   CBC With Differential   Screen for STD (sexually transmitted disease)       Relevant Orders   Cervicovaginal ancillary only   HEP, RPR, HIV Panel   Cervical cancer screening  Relevant Orders   Cytology - PAP       1) STI screening  was offered and accepted. Including Blood work  2)  ASCCP guidelines and rational discussed.  Patient opts for every 3 years screening interval  3) Contraception - the patient is status post recent termination of pregnancy and is prescribed OCPs today.    She plans to initiate [place] OCPs, and has been happy with this method in the past. Does not currently desire pregnancy.   4) Routine healthcare maintenance including cholesterol, diabetes screening discussed. Encouraged to reestablish care with PCP. Has gone to [facility] in the past.   5) Schedule annual exam in 1 year.  6) Lab results will be communicated to patient.    Conan Bowens, [place], CNM  08/10/2021 11:20 AM   [facility], [facility] 08/10/2021, 11:19 AM

## 2021-08-11 LAB — CBC WITH DIFFERENTIAL
Basophils Absolute: 0 10*3/uL (ref 0.0–0.2)
Basos: 1 %
EOS (ABSOLUTE): 0.4 10*3/uL (ref 0.0–0.4)
Eos: 7 %
Hematocrit: 35.7 % (ref 34.0–46.6)
Hemoglobin: 11.7 g/dL (ref 11.1–15.9)
Immature Grans (Abs): 0 10*3/uL (ref 0.0–0.1)
Immature Granulocytes: 1 %
Lymphocytes Absolute: 2.4 10*3/uL (ref 0.7–3.1)
Lymphs: 37 %
MCH: 28.7 pg (ref 26.6–33.0)
MCHC: 32.8 g/dL (ref 31.5–35.7)
MCV: 88 fL (ref 79–97)
Monocytes Absolute: 0.5 10*3/uL (ref 0.1–0.9)
Monocytes: 8 %
Neutrophils Absolute: 3.2 10*3/uL (ref 1.4–7.0)
Neutrophils: 46 %
RBC: 4.07 x10E6/uL (ref 3.77–5.28)
RDW: 13.5 % (ref 11.7–15.4)
WBC: 6.6 10*3/uL (ref 3.4–10.8)

## 2021-08-11 LAB — HEP, RPR, HIV PANEL
HIV Screen 4th Generation wRfx: NONREACTIVE
Hepatitis B Surface Ag: NEGATIVE
RPR Ser Ql: NONREACTIVE

## 2021-08-14 LAB — CERVICOVAGINAL ANCILLARY ONLY
Bacterial Vaginitis (gardnerella): NEGATIVE
Chlamydia: NEGATIVE
Comment: NEGATIVE
Comment: NEGATIVE
Comment: NEGATIVE
Comment: NORMAL
Neisseria Gonorrhea: NEGATIVE
Trichomonas: NEGATIVE

## 2021-08-15 ENCOUNTER — Encounter: Payer: Self-pay | Admitting: Obstetrics

## 2021-08-15 LAB — CYTOLOGY - PAP
Comment: NEGATIVE
Diagnosis: UNDETERMINED — AB
High risk HPV: NEGATIVE

## 2021-10-23 DIAGNOSIS — J069 Acute upper respiratory infection, unspecified: Secondary | ICD-10-CM | POA: Diagnosis not present

## 2022-01-01 DIAGNOSIS — S61402A Unspecified open wound of left hand, initial encounter: Secondary | ICD-10-CM | POA: Diagnosis not present

## 2022-01-01 DIAGNOSIS — Z23 Encounter for immunization: Secondary | ICD-10-CM | POA: Diagnosis not present

## 2022-01-06 DIAGNOSIS — S61402A Unspecified open wound of left hand, initial encounter: Secondary | ICD-10-CM | POA: Diagnosis not present

## 2022-01-27 ENCOUNTER — Ambulatory Visit
Admission: RE | Admit: 2022-01-27 | Discharge: 2022-01-27 | Disposition: A | Discharge status: Home / Self Care | Payer: Medicaid Other | Source: Ambulatory Visit | Attending: Emergency Medicine | Admitting: Emergency Medicine

## 2022-01-27 VITALS — BP 130/89 | HR 71 | Temp 97.7°F | Resp 16

## 2022-01-27 DIAGNOSIS — M25552 Pain in left hip: Secondary | ICD-10-CM | POA: Diagnosis not present

## 2022-01-27 DIAGNOSIS — N898 Other specified noninflammatory disorders of vagina: Secondary | ICD-10-CM | POA: Insufficient documentation

## 2022-01-27 DIAGNOSIS — R8290 Unspecified abnormal findings in urine: Secondary | ICD-10-CM | POA: Diagnosis not present

## 2022-01-27 LAB — POCT URINALYSIS DIP (MANUAL ENTRY)
Bilirubin, UA: NEGATIVE
Blood, UA: NEGATIVE
Glucose, UA: NEGATIVE mg/dL
Ketones, POC UA: NEGATIVE mg/dL
Leukocytes, UA: NEGATIVE
Nitrite, UA: NEGATIVE
Protein Ur, POC: NEGATIVE mg/dL
Spec Grav, UA: 1.015 (ref 1.010–1.025)
Urobilinogen, UA: 0.2 E.U./dL
pH, UA: 7 (ref 5.0–8.0)

## 2022-01-27 LAB — POCT URINE PREGNANCY: Preg Test, Ur: NEGATIVE

## 2022-01-27 MED ORDER — IBUPROFEN 600 MG PO TABS: 600.0000 mg | ORAL_TABLET | Freq: Four times a day (QID) | ORAL | 0 refills | Status: DC | PRN

## 2022-01-27 NOTE — ED Triage Notes (Signed)
Patient presents to Urgent Care with complaints of possible UTI. C/o of body aches urinary retention, urine that has smell, and urine having bubbles x 2 weeks. She states hx of kidney infections. Taking tylenol for pain.
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
## 2022-01-27 NOTE — Discharge Instructions (Addendum)
Your urine is normal today.    Take the prescribed ibuprofen as needed for discomfort.  Follow up with your primary care provider if your symptoms are not improving.

## 2022-01-28 LAB — CERVICOVAGINAL ANCILLARY ONLY
Bacterial Vaginitis (gardnerella): POSITIVE — AB
Candida Glabrata: NEGATIVE
Candida Vaginitis: NEGATIVE
Chlamydia: NEGATIVE
Comment: NEGATIVE
Comment: NEGATIVE
Comment: NEGATIVE
Comment: NEGATIVE
Comment: NEGATIVE
Comment: NORMAL
Neisseria Gonorrhea: NEGATIVE
Trichomonas: NEGATIVE

## 2022-01-29 ENCOUNTER — Telehealth (HOSPITAL_COMMUNITY): Payer: Self-pay | Admitting: Emergency Medicine

## 2022-01-29 MED ORDER — METRONIDAZOLE 500 MG PO TABS: 500.0000 mg | ORAL_TABLET | Freq: Two times a day (BID) | ORAL | 0 refills | Status: DC

## 2022-02-17 ENCOUNTER — Other Ambulatory Visit: Payer: Self-pay

## 2022-02-17 ENCOUNTER — Encounter: Payer: Self-pay | Admitting: Emergency Medicine

## 2022-02-17 ENCOUNTER — Encounter: Payer: Self-pay | Admitting: Oncology

## 2022-02-17 ENCOUNTER — Emergency Department
Admission: EM | Admit: 2022-02-17 | Discharge: 2022-02-17 | Disposition: A | Discharge status: Home / Self Care | Payer: Medicaid Other | Attending: Emergency Medicine | Admitting: Emergency Medicine

## 2022-02-17 DIAGNOSIS — J069 Acute upper respiratory infection, unspecified: Secondary | ICD-10-CM | POA: Insufficient documentation

## 2022-02-17 DIAGNOSIS — R059 Cough, unspecified: Secondary | ICD-10-CM | POA: Diagnosis present

## 2022-02-17 DIAGNOSIS — Z20822 Contact with and (suspected) exposure to covid-19: Secondary | ICD-10-CM | POA: Insufficient documentation

## 2022-02-17 LAB — RESP PANEL BY RT-PCR (FLU A&B, COVID) ARPGX2
Influenza A by PCR: NEGATIVE
Influenza B by PCR: NEGATIVE
SARS Coronavirus 2 by RT PCR: NEGATIVE

## 2022-02-17 NOTE — ED Provider Notes (Signed)
[facility] Provider Note    Event Date/Time   First MD Initiated Contact with Patient 02/17/22 1444     (approximate)   History   Cough, Nasal Congestion, and Generalized Body Aches   HPI  Molly Graves is a 31 y.o. female   Pleasant 31 year old female presents with upper respiratory infectious symptoms including cough, congestion, sore throat, fatigue, myalgias for the past 5 days.  Denies fever.  Denies chills, nausea or vomiting, denies abdominal pain.  Urinary symptoms.  Vaccinations up-to-date.  History was obtained via the patient and her husband who is at bedside.  Reviewed external medical notes for past medical history      Physical Exam   Triage Vital Signs: ED Triage Vitals  Enc Vitals Group     BP 02/17/22 1414 113/71     Pulse Rate 02/17/22 1414 74     Resp 02/17/22 1414 17     Temp 02/17/22 1414 98.4 F (36.9 C)     Temp Source 02/17/22 1414 Oral     SpO2 02/17/22 1414 99 %     Weight 02/17/22 1355 178 lb 9.2 oz (81 kg)     Height 02/17/22 1355 5\' 3"  (1.6 m)     Head Circumference --      Peak Flow --      Pain Score 02/17/22 1355 6     Pain Loc --      Pain Edu? --      Excl. in GC? --     Most recent vital signs: Vitals:   02/17/22 1414  BP: 113/71  Pulse: 74  Resp: 17  Temp: 98.4 F (36.9 C)  SpO2: 99%    General: Awake, no distress.  Toxic appearance, neck is supple with full range of motion, no fever. CV:  Good peripheral perfusion.  Resp:  Normal effort.  Lungs clear to auscultation without focality or wheezing. Abd:  No distention.  Nontender deep palpation all quadrants Other:  Oropharynx appears normal without lesions, exudates, masses or erythema   ED Results / Procedures / Treatments   Labs (all labs ordered are listed, but only abnormal results are displayed) Labs Reviewed  RESP PANEL BY RT-PCR (FLU A&B, COVID) [id number]     I reviewed labs and they are notable for negative respiratory viral  panel including flu and COVID PROCEDURES:  Critical Care performed: No  Procedures   MEDICATIONS ORDERED IN ED: Medications - No data to display  IMPRESSION / MDM / ASSESSMENT AND PLAN / ED COURSE  I reviewed the triage vital signs and the nursing notes.                              Differential diagnosis includes, but is not limited to, viral upper respiratory infection, considered but less likely emergent causes of symptoms including meningitis, bacterial pneumonia, intra-abdominal infections, throat abscess, deep space neck infection.    MDM: Overall well-appearing patient with symptoms consistent of viral upper respiratory infection.  Exam as above is overall benign less suspicion of emergent causes in differential.  Plan for anticipatory guidance for viral upper respiratory infection, PMD follow-up, return precautions.   Patient's presentation is most consistent with acute illness / injury with system symptoms.       FINAL CLINICAL IMPRESSION(S) / ED DIAGNOSES   Final diagnoses:  Upper respiratory tract infection, unspecified type     Rx / DC Orders  ED Discharge Orders     None        Note:  This document was prepared using Dragon voice recognition software and may include unintentional dictation errors.    Pilar Jarvis, MD 02/17/22 [phone]

## 2022-02-17 NOTE — ED Triage Notes (Signed)
Mom reports cough, congestion and general bodyaches for the last few days. 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

## 2022-02-17 NOTE — Discharge Instructions (Signed)
Take acetaminophen 650 mg and ibuprofen 400 mg every 6 hours.  Take with food.  Thank you for choosing [place] for your health care today!  Please see your primary doctor this week for a follow up appointment.   If you do not have a primary doctor call the following clinics to establish care:  If you have insurance:  [facility] [phone] [address] [place] [place] [address]   [facility]  [phone] [address] [place] [place] [address]   If you do not have insurance:  [facility]  [phone] [address], [place] [address]  Sometimes, in the early stages of certain disease courses it is difficult to detect in the emergency department evaluation -- so, it is important that you continue to monitor your symptoms and call your doctor right away or return to the emergency department if you develop any new or worsening symptoms.  It was my pleasure to care for you today.   Daneil Dan Modesto Charon, MD

## 2022-02-22 ENCOUNTER — Encounter: Payer: Self-pay | Admitting: Oncology

## 2022-03-02 ENCOUNTER — Ambulatory Visit
Admission: EM | Admit: 2022-03-02 | Discharge: 2022-03-02 | Disposition: A | Discharge status: Home / Self Care | Payer: Medicaid Other | Attending: Urgent Care | Admitting: Urgent Care

## 2022-03-02 DIAGNOSIS — B9689 Other specified bacterial agents as the cause of diseases classified elsewhere: Secondary | ICD-10-CM | POA: Diagnosis not present

## 2022-03-02 DIAGNOSIS — R051 Acute cough: Secondary | ICD-10-CM

## 2022-03-02 DIAGNOSIS — J019 Acute sinusitis, unspecified: Secondary | ICD-10-CM | POA: Diagnosis not present

## 2022-03-02 MED ORDER — BENZONATATE 100 MG PO CAPS: ORAL_CAPSULE | ORAL | 0 refills | Status: DC

## 2022-03-02 MED ORDER — AMOXICILLIN-POT CLAVULANATE 875-125 MG PO TABS: 1.0000 | ORAL_TABLET | Freq: Two times a day (BID) | ORAL | 0 refills | Status: AC

## 2022-03-02 NOTE — ED Provider Notes (Signed)
[facility]    CSN: [id number] Arrival date & time: 03/02/22  1353      History   Chief Complaint Chief Complaint  Patient presents with   Cough   Facial Pain    HPI Molly Graves is a 31 y.o. female.    Cough   Presents with symptoms x 3 weeks including cough productive of green mucus, worsening. Endorses Nasal congestion, ears popping, head feels "swollen", pressure in nose and forehead, myalgia.   Mom giving OTC cough/cold medication   Past Medical History:  Diagnosis Date   Anemia    Back pain affecting pregnancy in third trimester 09/17/3534   Complication of intrauterine device (IUD) ([place]) 04/19/2016   Depression    post partum depression   Dysuria 12/13/2018   Headache    MIGRAINES   Herpes genitalia    Normal vaginal delivery 12/25/2018   Postpartum care following vaginal delivery 12/25/2018   Pyelonephritis affecting pregnancy in second trimester 08/08/2018   Supervision of other normal pregnancy, antepartum 05/22/2018   [facility]inic [facility] P[facility]atal Labs Dating [place] Blood type: O/Positive/-- (12/23 1605)  Genetic Screen declines Antibody:Negative (12/23 1605) Anatomic [place]  incomplete Rubella: 1.86 (12/23 1605) Varicella:   GTT Third trimester:  RPR: Non Reactive (12/23 1605)  Rhogam O+ HBsAg: Negative (12/23 1605)  TDaP vaccine              Flu Shot: HIV: Non Reactive (12/23 1605)  Baby Food                                  Patient Active Problem List   Diagnosis Date Noted   Retained products of conception following abortion    Incomplete spontaneous abortion    S/P ORIF (open reduction internal fixation) fracture IM nail left femur 02/18/2019 03/04/2019   Femur fracture ([place]) 02/17/2019   Closed fracture of femur, shaft ([place]) 02/17/2019   MDD (major depressive disorder) 10/10/2018   Severe recurrent major depression without psychotic features ([place]) 10/10/2018   Herpes genitalis 10/02/2018   Iron deficiency anemia 09/03/2018    Past Surgical History:   Procedure Laterality Date   BREAST SURGERY     lumpectomy left breast   DIAGNOSTIC LAPAROSCOPY  2015   to rule out uterine injury after D&E   DILATION AND EVACUATION N/A 02/10/2020   Procedure: DILATATION AND EVACUATION;  Surgeon: Homero Fellers, MD;  Location: [facility];  Service: Gynecology;  Laterality: N/A;   FEMUR IM NAIL Left 02/18/2019   Procedure: INTRAMEDULLARY (IM) NAIL FEMORAL;  Surgeon: Carole Civil, MD;  Location: [facility];  Service: Orthopedics;  Laterality: Left;   HYSTEROSCOPY WITH D & C N/A 04/19/2016   Procedure: DILATATION AND CURETTAGE /HYSTEROSCOPY;  Surgeon: Gae Dry, MD;  Location: [facility];  Service: Gynecology;  Laterality: N/A;   INDUCED ABORTION  2015   IUD REMOVAL N/A 04/19/2016   Procedure: INTRAUTERINE DEVICE (IUD) REMOVAL;  Surgeon: Gae Dry, MD;  Location: [facility];  Service: Gynecology;  Laterality: N/A;   LAPAROSCOPY N/A 04/19/2016   Procedure: LAPAROSCOPY DIAGNOSTIC;  Surgeon: Gae Dry, MD;  Location: [facility];  Service: Gynecology;  Laterality: N/A;    OB History     Gravida  6   Para  3   Term  3   Preterm  0   AB  2   Living  3      SAB  1   IAB  1   Ectopic  0   Multiple  0   Live Births  3            Home Medications    Prior to Admission medications   Medication Sig Start Date End Date Taking? Authorizing Provider  FLUoxetine (PROZAC) 40 MG capsule TAKE 1 CAPSULE BY MOUTH EVERY DAY 03/08/21   Schuman, Christanna R, MD  ibuprofen (ADVIL) 600 MG tablet Take 1 tablet (600 mg total) by mouth every 6 (six) hours as needed. 01/27/22   Mickie Bail, NP  metoCLOPramide (REGLAN) 10 MG tablet Take 1 tablet (10 mg total) by mouth every 8 (eight) hours as needed for up to 10 days for nausea. 07/17/21 07/27/21  Fisher, Roselyn Bering, PA-C  metroNIDAZOLE (FLAGYL) 500 MG tablet Take 1 tablet (500 mg total) by mouth 2 (two) times daily. 01/29/22   Merrilee Jansky, MD  norethindrone-ethinyl estradiol-FE (JUNEL FE  1/20) 1-20 MG-MCG tablet Take 1 tablet by mouth daily. 08/10/21   Mirna Mires, CNM    Family History Family History  Problem Relation Age of Onset   Hypertension Maternal Aunt    Hypertension Maternal Grandmother    Cancer Other 29       Cervical, Malignant   Depression Mother    Hypertension Mother    Cancer Maternal Uncle    Cancer Paternal Aunt     Social History Social History   Tobacco Use   Smoking status: Every Day    Types: Cigars    Last attempt to quit: 04/17/2018    Years since quitting: 3.8   Smokeless tobacco: Never  Vaping Use   Vaping Use: Every day   Substances: CBD  Substance Use Topics   Alcohol use: Not Currently    Alcohol/week: 1.0 standard drink of alcohol    Types: 1 Glasses of wine per week    Comment: occ   Drug use: No     Allergies   Patient has no known allergies.   Review of Systems Review of Systems  Respiratory:  Positive for cough.      Physical Exam Triage Vital Signs ED Triage Vitals [03/02/22 1418]  Enc Vitals Group     BP 117/78     Pulse Rate 69     Resp 16     Temp 98.7 F (37.1 C)     Temp src      SpO2 97 %     Weight      Height      Head Circumference      Peak Flow      Pain Score 5     Pain Loc      Pain Edu?      Excl. in GC?    No data found.  Updated Vital Signs BP 117/78   Pulse 69   Temp 98.7 F (37.1 C)   Resp 16   LMP 02/17/2022 (Approximate)   SpO2 97%   Visual Acuity Right Eye Distance:   Left Eye Distance:   Bilateral Distance:    Right Eye Near:   Left Eye Near:    Bilateral Near:     Physical Exam Vitals reviewed.  Constitutional:      Appearance: Normal appearance.  HENT:     Head: Normocephalic.     Nose:     Right Sinus: Maxillary sinus tenderness present.     Left Sinus: Maxillary sinus tenderness present.  Mouth/Throat:     Mouth: Mucous membranes are moist.     Pharynx: Posterior oropharyngeal erythema present. No oropharyngeal exudate.  Eyes:      Conjunctiva/sclera: Conjunctivae normal.     Pupils: Pupils are equal, round, and reactive to light.  Cardiovascular:     Rate and Rhythm: Normal rate and regular rhythm.     Pulses: Normal pulses.     Heart sounds: Normal heart sounds.  Pulmonary:     Effort: Pulmonary effort is normal.     Breath sounds: Normal breath sounds.  Skin:    General: Skin is warm and dry.  Neurological:     General: No focal deficit present.     Mental Status: She is alert and oriented to person, place, and time.  Psychiatric:        Mood and Affect: Mood normal.        Behavior: Behavior normal.      UC Treatments / Results  Labs (all labs ordered are listed, but only abnormal results are displayed) Labs Reviewed - No data to display  EKG   Radiology No results found.  Procedures Procedures (including critical care time)  Medications Ordered in UC Medications - No data to display  Initial Impression / Assessment and Plan / UC Course  I have reviewed the triage vital signs and the nursing notes.  Pertinent labs & imaging results that were available during my care of the patient were reviewed by me and considered in my medical decision making (see chart for details).   Suspect bacterial rhinosinusitis secondary to past viral infection.  Treating infection with Augmentin, benzonatate for cough.  Follow-up with your primary care provider.   Final Clinical Impressions(s) / UC Diagnoses   Final diagnoses:  None   Discharge Instructions   None    ED Prescriptions   None    PDMP not reviewed this encounter.   Charma Igo, [place] 03/02/22 1457

## 2022-03-02 NOTE — ED Triage Notes (Signed)
Pt. States she has been experiencing a constant cough, and sinus pressure for the past 3 weeks. Pt. Also endorses chest pain when she coughs. She has been treating herself w/ OTC medication and no relief.

## 2022-03-02 NOTE — Discharge Instructions (Addendum)
Follow up with your primary care provider if your symptoms do not resolve with treatment.

## 2022-03-18 ENCOUNTER — Ambulatory Visit (INDEPENDENT_AMBULATORY_CARE_PROVIDER_SITE_OTHER): Payer: Medicaid Other | Admitting: Obstetrics & Gynecology

## 2022-03-18 ENCOUNTER — Encounter: Payer: Self-pay | Admitting: Obstetrics & Gynecology

## 2022-03-18 ENCOUNTER — Other Ambulatory Visit (HOSPITAL_COMMUNITY)
Admission: RE | Admit: 2022-03-18 | Discharge: 2022-03-18 | Disposition: A | Discharge status: Home / Self Care | Payer: Medicaid Other | Source: Ambulatory Visit | Attending: Obstetrics & Gynecology | Admitting: Obstetrics & Gynecology

## 2022-03-18 VITALS — BP 106/73 | HR 83 | Ht 63.0 in | Wt 182.7 lb

## 2022-03-18 DIAGNOSIS — N760 Acute vaginitis: Secondary | ICD-10-CM | POA: Diagnosis present

## 2022-03-18 DIAGNOSIS — N926 Irregular menstruation, unspecified: Secondary | ICD-10-CM

## 2022-03-18 DIAGNOSIS — Z113 Encounter for screening for infections with a predominantly sexual mode of transmission: Secondary | ICD-10-CM | POA: Diagnosis not present

## 2022-03-18 DIAGNOSIS — Z30011 Encounter for initial prescription of contraceptive pills: Secondary | ICD-10-CM

## 2022-03-18 MED ORDER — TRIAMCINOLONE ACETONIDE 0.5 % EX OINT: 1.0000 | TOPICAL_OINTMENT | Freq: Two times a day (BID) | CUTANEOUS | 0 refills | Status: DC

## 2022-03-18 MED ORDER — TERCONAZOLE 0.8 % VA CREA: 1.0000 | TOPICAL_CREAM | Freq: Every day | VAGINAL | 0 refills | Status: DC

## 2022-03-18 MED ORDER — NORETHIN ACE-ETH ESTRAD-FE 1-20 MG-MCG PO TABS: 1.0000 | ORAL_TABLET | Freq: Every day | ORAL | 3 refills | Status: DC

## 2022-03-18 NOTE — Progress Notes (Signed)
Subjective:    Molly Graves is a 31 y.o. female who presents for evaluation of infrequent menses. Last menstrual period was 6 weeks ago. Current menses pattern: irregular.  Age of menarche was 67. Current contraceptive method: abstinence. Associated/contributing factors include none. Patient denies acne, hirsutism, intense aerobic exercise, and target symptoms of eating disorder. Evaluation to date:  starting today. Patient's last menstrual period was 02/17/2022 (approximate). The following portions of the patient's history were reviewed and updated as appropriate: allergies, current medications, past family history, past medical history, past social history, past surgical history, and problem list. Review of Systems Pertinent items noted in HPI and remainder of comprehensive ROS otherwise negative.    Objective:    BP 106/73   Pulse 83   Ht 5\' 3"  (1.6 m)   Wt 182 lb 11.2 oz (82.9 kg)   LMP 02/17/2022 (Approximate)   BMI 32.36 kg/m  General appearance: alert, cooperative, and no distress Breasts: normal appearance, no masses or tenderness Abdomen: normal findings: no organomegaly and soft, non-tender Pelvic: cervix normal in appearance, external genitalia normal, no adnexal masses or tenderness, no cervical motion tenderness, uterus normal size, shape, and consistency, vagina normal without discharge, and satellite lesions of yeastpresent on the vulva and buttocks Extremities: extremities normal, atraumatic, no cyanosis or edema    Assessment:    Irreg menses    Plan:  Keep symptom diary  Once bleeding pattern is established, test accordingly, and rto prn  Rosario Adie, MD  03/18/2022 3:09 PM

## 2022-03-19 ENCOUNTER — Telehealth: Payer: Medicaid Other | Admitting: Physician Assistant

## 2022-03-19 DIAGNOSIS — N760 Acute vaginitis: Secondary | ICD-10-CM | POA: Diagnosis not present

## 2022-03-19 LAB — CERVICOVAGINAL ANCILLARY ONLY
Bacterial Vaginitis (gardnerella): POSITIVE — AB
Candida Glabrata: NEGATIVE
Candida Vaginitis: NEGATIVE
Chlamydia: NEGATIVE
Comment: NEGATIVE
Comment: NEGATIVE
Comment: NEGATIVE
Comment: NEGATIVE
Comment: NEGATIVE
Comment: NORMAL
Neisseria Gonorrhea: NEGATIVE
Trichomonas: NEGATIVE

## 2022-03-19 LAB — HEP, RPR, HIV PANEL
HIV Screen 4th Generation wRfx: NONREACTIVE
Hepatitis B Surface Ag: NEGATIVE
RPR Ser Ql: NONREACTIVE

## 2022-03-19 MED ORDER — METRONIDAZOLE 500 MG PO TABS
500.0000 mg | ORAL_TABLET | Freq: Two times a day (BID) | ORAL | 0 refills | Status: AC
Start: 2022-03-19 — End: 2022-03-26

## 2022-03-19 NOTE — Progress Notes (Signed)

## 2022-05-01 DIAGNOSIS — J069 Acute upper respiratory infection, unspecified: Secondary | ICD-10-CM | POA: Diagnosis not present

## 2022-05-01 DIAGNOSIS — H65192 Other acute nonsuppurative otitis media, left ear: Secondary | ICD-10-CM | POA: Diagnosis not present

## 2022-05-06 ENCOUNTER — Telehealth: Payer: Medicaid Other | Admitting: Family Medicine

## 2022-05-06 ENCOUNTER — Ambulatory Visit
Admission: EM | Admit: 2022-05-06 | Discharge: 2022-05-06 | Disposition: A | Discharge status: Home / Self Care | Payer: Medicaid Other | Attending: Emergency Medicine | Admitting: Emergency Medicine

## 2022-05-06 DIAGNOSIS — R300 Dysuria: Secondary | ICD-10-CM

## 2022-05-06 DIAGNOSIS — R3989 Other symptoms and signs involving the genitourinary system: Secondary | ICD-10-CM

## 2022-05-06 DIAGNOSIS — N760 Acute vaginitis: Secondary | ICD-10-CM | POA: Diagnosis not present

## 2022-05-06 DIAGNOSIS — Z87440 Personal history of urinary (tract) infections: Secondary | ICD-10-CM

## 2022-05-06 LAB — POCT URINALYSIS DIP (MANUAL ENTRY)
Bilirubin, UA: NEGATIVE
Blood, UA: NEGATIVE
Glucose, UA: NEGATIVE mg/dL
Ketones, POC UA: NEGATIVE mg/dL
Leukocytes, UA: NEGATIVE
Nitrite, UA: NEGATIVE
Protein Ur, POC: NEGATIVE mg/dL
Spec Grav, UA: 1.02 (ref 1.010–1.025)
Urobilinogen, UA: 0.2 E.U./dL
pH, UA: 7 (ref 5.0–8.0)

## 2022-05-06 MED ORDER — METRONIDAZOLE 500 MG PO TABS: 500.0000 mg | ORAL_TABLET | Freq: Two times a day (BID) | ORAL | 0 refills | Status: AC

## 2022-05-06 MED ORDER — METRONIDAZOLE 0.75 % VA GEL: VAGINAL | 0 refills | Status: DC

## 2022-05-06 NOTE — ED Provider Notes (Signed)
Molly Graves    CSN: [id number] Arrival date & time: 05/06/22  1652    HISTORY   Chief Complaint  Patient presents with   Urinary Frequency    I believe I have a UTI. I was advised to go to urgent care to confirm it's not my kidneys. - Entered by patient   HPI Molly Graves is a pleasant, 31 y.o. female who presents to urgent care today. Patient states she believes she has a urinary tract infection, presenting for testing.  Patient has a history of BV with 2 episodes in the past 6 months.    Past Medical History:  Diagnosis Date   Anemia    Back pain affecting pregnancy in third trimester 11/17/2018   Complication of intrauterine device (IUD) (HCC) 04/19/2016   Depression    post partum depression   Dysuria 12/13/2018   Headache    MIGRAINES   Herpes genitalia    Normal vaginal delivery 12/25/2018   Postpartum care following vaginal delivery 12/25/2018   Pyelonephritis affecting pregnancy in second trimester 08/08/2018   Supervision of other normal pregnancy, antepartum 05/22/2018   Clinic [facility] Prenatal Labs Dating [place] Blood type: O/Positive/-- (12/23 1605)  Genetic Screen declines Antibody:Negative (12/23 1605) Anatomic [place]  incomplete Rubella: 1.86 (12/23 1605) Varicella:   GTT Third trimester:  RPR: Non Reactive (12/23 1605)  Rhogam O+ HBsAg: Negative (12/23 1605)  TDaP vaccine              Flu Shot: HIV: Non Reactive (12/23 1605)  Baby Food                                 Patient Active Problem List   Diagnosis Date Noted   Retained products of conception following abortion    Incomplete spontaneous abortion    S/P ORIF (open reduction internal fixation) fracture IM nail left femur 02/18/2019 03/04/2019   Femur fracture (HCC) 02/17/2019   Closed fracture of femur, shaft (HCC) 02/17/2019   MDD (major depressive disorder) 10/10/2018   Severe recurrent major depression without psychotic features (HCC) 10/10/2018   Herpes genitalis 10/02/2018   Iron deficiency  anemia 09/03/2018   Past Surgical History:  Procedure Laterality Date   BREAST SURGERY     lumpectomy left breast   DIAGNOSTIC LAPAROSCOPY  2015   to rule out uterine injury after D&E   DILATION AND EVACUATION N/A 02/10/2020   Procedure: DILATATION AND EVACUATION;  Surgeon: Natale Milch, MD;  Location: [facility];  Service: Gynecology;  Laterality: N/A;   FEMUR IM NAIL Left 02/18/2019   Procedure: INTRAMEDULLARY (IM) NAIL FEMORAL;  Surgeon: Vickki Hearing, MD;  Location: [facility];  Service: Orthopedics;  Laterality: Left;   HYSTEROSCOPY WITH D & C N/A 04/19/2016   Procedure: DILATATION AND CURETTAGE /HYSTEROSCOPY;  Surgeon: Nadara Mustard, MD;  Location: [facility];  Service: Gynecology;  Laterality: N/A;   INDUCED ABORTION  2015   IUD REMOVAL N/A 04/19/2016   Procedure: INTRAUTERINE DEVICE (IUD) REMOVAL;  Surgeon: Nadara Mustard, MD;  Location: [facility];  Service: Gynecology;  Laterality: N/A;   LAPAROSCOPY N/A 04/19/2016   Procedure: LAPAROSCOPY DIAGNOSTIC;  Surgeon: Nadara Mustard, MD;  Location: [facility];  Service: Gynecology;  Laterality: N/A;   OB History     Gravida  6   Para  3   Term  3   Preterm  0   AB  2  Living  3      SAB  1   IAB  1   Ectopic  0   Multiple  0   Live Births  3          Home Medications    Prior to Admission medications   Medication Sig Start Date End Date Taking? Authorizing Provider  FLUoxetine (PROZAC) 40 MG capsule TAKE 1 CAPSULE BY MOUTH EVERY DAY 03/08/21   Adelene Idler R, MD  norethindrone-ethinyl estradiol-FE (JUNEL FE 1/20) 1-20 MG-MCG tablet Take 1 tablet by mouth daily. 03/18/22   Linzie Collin, MD    Family History Family History  Problem Relation Age of Onset   Hypertension Maternal Aunt    Hypertension Maternal Grandmother    Cancer Other 29       Cervical, Malignant   Depression Mother    Hypertension Mother    Cancer Maternal Uncle    Cancer Paternal Aunt    Social History Social  History   Tobacco Use   Smoking status: Every Day    Types: Cigars    Last attempt to quit: 04/17/2018    Years since quitting: 4.0   Smokeless tobacco: Never  Vaping Use   Vaping Use: Every day   Substances: CBD  Substance Use Topics   Alcohol use: Not Currently    Alcohol/week: 1.0 standard drink of alcohol    Types: 1 Glasses of wine per week    Comment: occ   Drug use: No   Allergies   Patient has no known allergies.  Review of Systems Review of Systems Pertinent findings revealed after performing a 14 point review of systems has been noted in the history of present illness.  Physical Exam Triage Vital Signs ED Triage Vitals  Enc Vitals Group     BP 04/13/21 0827 (!) 147/82     Pulse Rate 04/13/21 0827 72     Resp 04/13/21 0827 18     Temp 04/13/21 0827 98.3 F (36.8 C)     Temp Source 04/13/21 0827 Oral     SpO2 04/13/21 0827 98 %     Weight --      Height --      Head Circumference --      Peak Flow --      Pain Score 04/13/21 0826 5     Pain Loc --      Pain Edu? --      Excl. in GC? --   No data found.  Updated Vital Signs BP 121/82   Pulse 73   Temp 98.6 F (37 C)   Resp 17   SpO2 98%   Breastfeeding No   Physical Exam  Visual Acuity Right Eye Distance:   Left Eye Distance:   Bilateral Distance:    Right Eye Near:   Left Eye Near:    Bilateral Near:     UC Couse / Diagnostics / Procedures:     Radiology No results found.  Procedures Procedures (including critical care time) EKG  Pending results:  Labs Reviewed  POCT URINALYSIS DIP (MANUAL ENTRY)  CERVICOVAGINAL ANCILLARY ONLY    Medications Ordered in UC: Medications - No data to display  UC Diagnoses / Final Clinical Impressions(s)   I have reviewed the triage vital signs and the nursing notes.  Pertinent labs & imaging results that were available during my care of the patient were reviewed by me and considered in my medical decision making (see chart for details).  Final diagnoses:  Dysuria  Vulvovaginitis    {LMSTDP:27058}  {LMUTIP:27060}  ED Prescriptions     Medication Sig Dispense Auth. Provider   metroNIDAZOLE (METROGEL) 0.75 % vaginal gel Insert vaginally using applicator nightly for 5 nights.  Abstain from sexual intercourse or tampon use until treatment is complete. 70 g Theadora RamaMorgan, Zondra Lawlor Scales, PA-C   metroNIDAZOLE (FLAGYL) 500 MG tablet Take 1 tablet (500 mg total) by mouth 2 (two) times daily for 7 days. 14 tablet Theadora RamaMorgan, Eunie Lawn Scales, PA-C      PDMP not reviewed this encounter.  Disposition Upon Discharge:  Condition: stable for discharge home  Patient presented with concern for an acute illness with associated systemic symptoms and significant discomfort requiring urgent management. In my opinion, this is a condition that a prudent lay person (someone who possesses an average knowledge of health and medicine) may potentially expect to result in complications if not addressed urgently such as respiratory distress, impairment of bodily function or dysfunction of bodily organs.   As such, the patient has been evaluated and assessed, work-up was performed and treatment was provided in alignment with urgent care protocols and evidence based medicine.  Patient/parent/caregiver has been advised that the patient may require follow up for further testing and/or treatment if the symptoms continue in spite of treatment, as clinically indicated and appropriate.  Routine symptom specific, illness specific and/or disease specific instructions were discussed with the patient and/or caregiver at length.  Prevention strategies for avoiding STD exposure were also discussed.  The patient will follow up with their current PCP if and as advised. If the patient does not currently have a PCP we will assist them in obtaining one.   The patient may need specialty follow up if the symptoms continue, in spite of conservative treatment and management, for  further workup, evaluation, consultation and treatment as clinically indicated and appropriate.  Patient/parent/caregiver verbalized understanding and agreement of plan as discussed.  All questions were addressed during visit.  Please see discharge instructions below for further details of plan.  Discharge Instructions:   Discharge Instructions      Based on the symptoms and concerns you shared with me today, you were treated for presumed bacterial vaginitis with metronidazole 500 mg twice daily for the next 7 days and metronidazole gel, please insert 1 applicatorful once daily at bedtime for the next 5 nights.  I recommend that you abstain from sexual intercourse, tampon use or any other other intravaginal activities while being treated.   Your urine pregnancy test today is negative.   Our point-of-care analysis of your urine sample today was normal and did not reveal any concern for urinary tract infection.   The results of your STD testing today which tests for gonorrhea, chlamydia and trichomonas will be posted to your MyChart account in the next 3 to 5 days.  If any of your results are abnormal, you will receive a phone call regarding further treatment.  Additional prescriptions, if any are needed, will be provided for you at your pharmacy.   Please abstain from sexual intercourse of any kind, vaginal, oral or anal, until until you have received the results of your STD testing.  If you have not had complete resolution of your symptoms after completing any needed treatment, please return for repeat evaluation.   Thank you for visiting urgent care today.  I appreciate the opportunity to participate in your care.       This office note has been dictated using Dragon speech recognition  software.  Unfortunately, this method of dictation can sometimes lead to typographical or grammatical errors.  I apologize for your inconvenience in advance if this occurs.  Please do not hesitate to reach  out to me if clarification is needed.

## 2022-05-06 NOTE — ED Triage Notes (Signed)
Pt. Presents to UC w/ c/o urinary frequency and urgency for the past 3 days.  

## 2022-05-06 NOTE — Progress Notes (Signed)
  Because history of kideny infections- urine sample for testing would be best, I feel your condition warrants further evaluation and I recommend that you be seen in a face to face visit.   NOTE: There will be NO CHARGE for this eVisit   If you are having a true medical emergency please call 911.      For an urgent face to face visit, [facility] has seven urgent care centers for your convenience:     [facility] at [facility] Directions [phone] [address] [place], [place] [address]    [facility]) Get Driving Directions [phone] [address] [place], [place] [address]  [facility]) Get Driving Directions [phone] [address] [place],  [place]  [address]  [facility] Center [facility] Directions  [phone] [address] [place],  [place] [address]   [facility] Health Urgent Care at [facility] Get Driving Directions [phone] [address] [place], [place] [address]   [facility] at [facility] Get Driving Directions  [phone] [address] [place], [place] [address]   [facility] Health Urgent Care at [facility] Directions [phone] [address] [place], [place] [address]  Your MyChart E-visit questionnaire answers were reviewed by a board certified advanced clinical practitioner to complete your personal care plan based on your specific symptoms.  Thank you for using e-Visits.

## 2022-05-06 NOTE — Discharge Instructions (Signed)
Based on the symptoms and concerns you shared with me today, you were treated for presumed bacterial vaginitis with metronidazole 500 mg twice daily for the next 7 days and metronidazole gel, please insert 1 applicatorful once daily at bedtime for the next 5 nights.  I recommend that you abstain from sexual intercourse, tampon use or any other other intravaginal activities while being treated.   Your urine pregnancy test today is negative.   Our point-of-care analysis of your urine sample today was normal and did not reveal any concern for urinary tract infection.   The results of your STD testing today which tests for gonorrhea, chlamydia and trichomonas will be posted to your MyChart account in the next 3 to 5 days.  If any of your results are abnormal, you will receive a phone call regarding further treatment.  Additional prescriptions, if any are needed, will be provided for you at your pharmacy.   Please abstain from sexual intercourse of any kind, vaginal, oral or anal, until until you have received the results of your STD testing.  If you have not had complete resolution of your symptoms after completing any needed treatment, please return for repeat evaluation.   Thank you for visiting urgent care today.  I appreciate the opportunity to participate in your care.

## 2022-05-07 LAB — CERVICOVAGINAL ANCILLARY ONLY
Bacterial Vaginitis (gardnerella): NEGATIVE
Candida Glabrata: NEGATIVE
Candida Vaginitis: NEGATIVE
Chlamydia: NEGATIVE
Comment: NEGATIVE
Comment: NEGATIVE
Comment: NEGATIVE
Comment: NEGATIVE
Comment: NEGATIVE
Comment: NORMAL
Neisseria Gonorrhea: NEGATIVE
Trichomonas: NEGATIVE

## 2022-06-18 ENCOUNTER — Other Ambulatory Visit: Payer: Self-pay

## 2022-06-18 DIAGNOSIS — F321 Major depressive disorder, single episode, moderate: Secondary | ICD-10-CM

## 2022-06-18 DIAGNOSIS — Z0000 Encounter for general adult medical examination without abnormal findings: Secondary | ICD-10-CM | POA: Diagnosis not present

## 2022-06-18 MED ORDER — FLUOXETINE HCL 40 MG PO CAPS: ORAL_CAPSULE | ORAL | 5 refills | Status: DC

## 2022-06-18 NOTE — Telephone Encounter (Signed)
Refill request was sent in from [facility] pharmacy for Fluoxetine 40mg . Prescription was last filled 03/08/21 by Dr. Gilman Schmidt, patients last office visit was 03/18/22. Please advise if appropriate to refill? KW

## 2022-06-25 DIAGNOSIS — R5381 Other malaise: Secondary | ICD-10-CM | POA: Diagnosis not present

## 2022-06-25 DIAGNOSIS — F329 Major depressive disorder, single episode, unspecified: Secondary | ICD-10-CM | POA: Diagnosis not present

## 2022-06-25 DIAGNOSIS — R5383 Other fatigue: Secondary | ICD-10-CM | POA: Diagnosis not present

## 2022-06-25 DIAGNOSIS — Z0000 Encounter for general adult medical examination without abnormal findings: Secondary | ICD-10-CM | POA: Diagnosis not present

## 2022-06-25 DIAGNOSIS — R053 Chronic cough: Secondary | ICD-10-CM | POA: Diagnosis not present

## 2022-06-25 DIAGNOSIS — F4310 Post-traumatic stress disorder, unspecified: Secondary | ICD-10-CM | POA: Diagnosis not present

## 2022-06-25 DIAGNOSIS — M791 Myalgia, unspecified site: Secondary | ICD-10-CM | POA: Diagnosis not present

## 2022-07-13 ENCOUNTER — Other Ambulatory Visit: Payer: Self-pay | Admitting: Certified Nurse Midwife

## 2022-07-13 DIAGNOSIS — F321 Major depressive disorder, single episode, moderate: Secondary | ICD-10-CM

## 2022-07-13 DIAGNOSIS — F332 Major depressive disorder, recurrent severe without psychotic features: Secondary | ICD-10-CM

## 2022-07-17 ENCOUNTER — Other Ambulatory Visit: Payer: Self-pay | Admitting: Obstetrics

## 2022-07-17 MED ORDER — FLUOXETINE HCL 40 MG PO CAPS: 40.0000 mg | ORAL_CAPSULE | Freq: Every day | ORAL | 1 refills | Status: DC

## 2022-07-17 NOTE — Telephone Encounter (Signed)
Patient's pharmacy is requesting a renewal of her year's supply of Prozac. This was previously ordered by Dr. Gardiner Rhyme. The patient needs to set up a [facility] appointment with a provider in order to assess her continued use of this medication, and for a GYN physical. Imagene Riches, CNM  07/17/2022 1:50 PM

## 2022-08-04 DIAGNOSIS — R350 Frequency of micturition: Secondary | ICD-10-CM | POA: Diagnosis not present

## 2022-08-04 DIAGNOSIS — N390 Urinary tract infection, site not specified: Secondary | ICD-10-CM | POA: Diagnosis not present

## 2022-08-23 ENCOUNTER — Telehealth: Payer: Medicaid Other | Admitting: Nurse Practitioner

## 2022-08-23 DIAGNOSIS — N760 Acute vaginitis: Secondary | ICD-10-CM | POA: Diagnosis not present

## 2022-08-23 DIAGNOSIS — B9689 Other specified bacterial agents as the cause of diseases classified elsewhere: Secondary | ICD-10-CM | POA: Diagnosis not present

## 2022-08-23 MED ORDER — METRONIDAZOLE 500 MG PO TABS: 500.0000 mg | ORAL_TABLET | Freq: Two times a day (BID) | ORAL | 0 refills | Status: AC

## 2022-08-23 NOTE — Progress Notes (Signed)
E-Visit for Vaginal Symptoms  We are sorry that you are not feeling well. Here is how we plan to help! Based on what you shared with me it looks like you: May have a vaginosis due to bacteria  Vaginosis is an inflammation of the vagina that can result in discharge, itching and pain. The cause is usually a change in the normal balance of vaginal bacteria or an infection. Vaginosis can also result from reduced estrogen levels after menopause.  The most common causes of vaginosis are:   Bacterial vaginosis which results from an overgrowth of one on several organisms that are normally present in your vagina.   Yeast infections which are caused by a naturally occurring fungus called candida.   Vaginal atrophy (atrophic vaginosis) which results from the thinning of the vagina from reduced estrogen levels after menopause.   Trichomoniasis which is caused by a parasite and is commonly transmitted by sexual intercourse.  Factors that increase your risk of developing vaginosis include: Medications, such as antibiotics and steroids Uncontrolled diabetes Use of hygiene products such as bubble bath, vaginal spray or vaginal deodorant Douching Wearing damp or tight-fitting clothing Using an intrauterine device (IUD) for birth control Hormonal changes, such as those associated with pregnancy, birth control pills or menopause Sexual activity Having a sexually transmitted infection  Your treatment plan is Metronidazole or Flagyl 500mg twice a day for 7 days.  I have electronically sent this prescription into the pharmacy that you have chosen.  Be sure to take all of the medication as directed. Stop taking any medication if you develop a rash, tongue swelling or shortness of breath. Mothers who are breast feeding should consider pumping and discarding their breast milk while on these antibiotics. However, there is no consensus that infant exposure at these doses would be harmful.  Remember that  medication creams can weaken latex condoms. .   HOME CARE:  Good hygiene may prevent some types of vaginosis from recurring and may relieve some symptoms:  Avoid baths, hot tubs and whirlpool spas. Rinse soap from your outer genital area after a shower, and dry the area well to prevent irritation. Don't use scented or harsh soaps, such as those with deodorant or antibacterial action. Avoid irritants. These include scented tampons and pads. Wipe from front to back after using the toilet. Doing so avoids spreading fecal bacteria to your vagina.  Other things that may help prevent vaginosis include:  Don't douche. Your vagina doesn't require cleansing other than normal bathing. Repetitive douching disrupts the normal organisms that reside in the vagina and can actually increase your risk of vaginal infection. Douching won't clear up a vaginal infection. Use a latex condom. Both female and female latex condoms may help you avoid infections spread by sexual contact. Wear cotton underwear. Also wear pantyhose with a cotton crotch. If you feel comfortable without it, skip wearing underwear to bed. Yeast thrives in moist environments Your symptoms should improve in the next day or two.  GET HELP RIGHT AWAY IF:  You have pain in your lower abdomen ( pelvic area or over your ovaries) You develop nausea or vomiting You develop a fever Your discharge changes or worsens You have persistent pain with intercourse You develop shortness of breath, a rapid pulse, or you faint.  These symptoms could be signs of problems or infections that need to be evaluated by a medical provider now.  MAKE SURE YOU   Understand these instructions. Will watch your condition. Will get help right   away if you are not doing well or get worse.  Thank you for choosing an e-visit.  Your e-visit answers were reviewed by a board certified advanced clinical practitioner to complete your personal care plan. Depending upon the  condition, your plan could have included both over the counter or prescription medications.  Please review your pharmacy choice. Make sure the pharmacy is open so you can pick up prescription now. If there is a problem, you may contact your provider through MyChart messaging and have the prescription routed to another pharmacy.  Your safety is important to us. If you have drug allergies check your prescription carefully.   For the next 24 hours you can use MyChart to ask questions about today's visit, request a non-urgent call back, or ask for a work or school excuse. You will get an email in the next two days asking about your experience. I hope that your e-visit has been valuable and will speed your recovery.   Meds ordered this encounter  Medications   metroNIDAZOLE (FLAGYL) 500 MG tablet    Sig: Take 1 tablet (500 mg total) by mouth 2 (two) times daily for 7 days.    Dispense:  14 tablet    Refill:  0     I spent approximately 5 minutes reviewing the patient's history, current symptoms and coordinating their care today.   

## 2022-09-11 DIAGNOSIS — R112 Nausea with vomiting, unspecified: Secondary | ICD-10-CM | POA: Diagnosis not present

## 2022-10-16 DIAGNOSIS — R519 Headache, unspecified: Secondary | ICD-10-CM | POA: Diagnosis not present

## 2022-10-16 DIAGNOSIS — R32 Unspecified urinary incontinence: Secondary | ICD-10-CM | POA: Diagnosis not present

## 2022-10-16 DIAGNOSIS — R5383 Other fatigue: Secondary | ICD-10-CM | POA: Diagnosis not present

## 2022-10-16 DIAGNOSIS — F332 Major depressive disorder, recurrent severe without psychotic features: Secondary | ICD-10-CM | POA: Diagnosis not present

## 2022-10-16 DIAGNOSIS — R5381 Other malaise: Secondary | ICD-10-CM | POA: Diagnosis not present

## 2022-10-16 DIAGNOSIS — R110 Nausea: Secondary | ICD-10-CM | POA: Diagnosis not present

## 2022-10-16 DIAGNOSIS — F329 Major depressive disorder, single episode, unspecified: Secondary | ICD-10-CM | POA: Diagnosis not present

## 2022-12-10 ENCOUNTER — Telehealth: Payer: Medicaid Other | Admitting: Physician Assistant

## 2022-12-10 DIAGNOSIS — A6004 Herpesviral vulvovaginitis: Secondary | ICD-10-CM | POA: Diagnosis not present

## 2022-12-10 MED ORDER — VALACYCLOVIR HCL 500 MG PO TABS: 500.0000 mg | ORAL_TABLET | Freq: Two times a day (BID) | ORAL | 0 refills | Status: AC

## 2022-12-10 NOTE — Patient Instructions (Signed)
Molly Graves, thank you for joining Molly Climes, PA-C for today's virtual visit.  While this provider is not your primary care provider (PCP), if your PCP is located in our provider database this encounter information will be shared with them immediately following your visit.   A [facility] MyChart account gives you access to today's visit and all your visits, tests, and labs performed at [facility] " click here if you don't have a [facility] MyChart account or go to [email or web address]  Consent: (Patient) Molly Graves provided verbal consent for this virtual visit at the beginning of the encounter.  Current Medications:  Current Outpatient Medications:    valACYclovir (VALTREX) 500 MG tablet, Take 1 tablet (500 mg total) by mouth 2 (two) times daily for 5 days., Disp: 10 tablet, Rfl: 0   albuterol (PROAIR HFA) 108 (90 Base) MCG/ACT inhaler, TAKE 2 PUFFS BY MOUTH EVERY 6 HOURS AS NEEDED FOR WHEEZE OR SHORTNESS OF BREATH, Disp: , Rfl:    chlorhexidine (PERIDEX) 0.12 % solution, 2 (two) times daily., Disp: , Rfl:    FLUoxetine (PROZAC) 40 MG capsule, TAKE 1 CAPSULE BY MOUTH EVERY DAY, Disp: 30 capsule, Rfl: 5   ibuprofen (ADVIL) 800 MG tablet, Take 800 mg by mouth every 8 (eight) hours as needed., Disp: , Rfl:    norethindrone-ethinyl estradiol-FE (JUNEL FE 1/20) 1-20 MG-MCG tablet, Take 1 tablet by mouth daily., Disp: 84 tablet, Rfl: 3   Medications ordered in this encounter:  Meds ordered this encounter  Medications   valACYclovir (VALTREX) 500 MG tablet    Sig: Take 1 tablet (500 mg total) by mouth 2 (two) times daily for 5 days.    Dispense:  10 tablet    Refill:  0    Order Specific Question:   Supervising Provider    Answer:   Molly Graves     *If you need refills on other medications prior to your next appointment, please contact your pharmacy*  Follow-Up: Call back or seek an in-person evaluation if the symptoms worsen or if the  condition fails to improve as anticipated.  [facility] [phone]  Other Instructions Genital Herpes Genital herpes is a common sexually transmitted infection (STI) that is caused by a virus. The virus spreads from person to person through contact with a sore, infected saliva, or infected skin. The virus can cause itching, blisters, and sores around the genitals or rectum. During an outbreak of infection, symptoms may last for several days and then go away. However, the virus remains in the body, so more outbreaks may happen in the future. The time between outbreaks varies and can be from months to years. Genital herpes can affect anyone. It is particularly concerning for pregnant women because the virus can be passed to the baby during delivery. Genital herpes is also a concern for people who have a weak disease-fighting system (immune system). What are the causes? This condition is caused by the herpes simplex virus, type 1 or type 2 (HSV-1 or HSV-2). The virus may spread through: Sexual contact with an infected person, including vaginal, anal, and oral sex. Contact with a herpes sore. The skin. This means that you can get herpes from an infected partner even if there are no blisters or sores present. Your partner may not know that he or she is infected. What increases the risk? You are more likely to develop this condition if: You have sex with many partners. You do not  use latex or polyurethane condoms during sex. What are the signs or symptoms? Most people do not have symptoms or they have mild symptoms that may be mistaken for other skin problems. Symptoms may include: Small, red bumps near the genitals, rectum, or mouth. These bumps turn into blisters and then sores. Flu-like (influenza-like) symptoms, including: Fever. Body aches. Swollen lymph nodes. Headache. Painful urination. Pain and itching in the genital area or rectal area. Vaginal discharge. Tingling or  shooting pain in the legs and buttocks. Generally, symptoms are more severe and last longer during the first (primary) outbreak. Influenza-like symptoms are also more common during the primary outbreak. How is this diagnosed? This condition may be diagnosed based on: A physical exam. Your medical history. Blood tests. A test of a fluid sample (culture) from an open sore. How is this treated? There is no cure for this condition, but treatment with antiviral medicines can do the following: Speed up healing and relieve symptoms. Help to reduce the spread of the virus to sexual partners. Limit the chance of future outbreaks, or make future outbreaks shorter. Lessen symptoms of future outbreaks. Your health care provider may also recommend over-the-counter medicines to help with pain and itching. Follow these instructions at home: If you have an outbreak:  Keep the affected areas dry and clean. Avoid rubbing or touching blisters and sores. If you do touch blisters or sores: Wash your hands thoroughly with soap and water for at least 20 seconds. If soap and water are not available, use an alcohol-based hand sanitizer. Do not touch your eyes afterward. Sexual activity Do not have sexual contact during active outbreaks. Practice safe sex. Herpes can spread even if your partner does not have blisters or sores. Latex or polyurethane condoms and female condoms may help prevent the spread of the herpes virus. Managing pain and discomfort If directed, put ice on the painful area. To do this: Put ice in a plastic bag. Place a towel between your skin and the bag. Leave the ice on for 20 minutes, 2-3 times a day. Remove the ice if your skin turns bright red. This is very important. If you cannot feel pain, heat, or cold, you have a greater risk of damage to the area. If told, take a cool sitz bath to help relieve pain or itching. A sitz bath is a water bath that you take while sitting down in water  that is deep enough to cover your hips and buttocks. General instructions Take over-the-counter and prescription medicines only as told by your health care provider. If you were prescribed an antiviral medicine, use it as told by your health care provider. Do not stop using the antiviral even if you start to feel better. Keep all follow-up visits. This is important. How is this prevented? Use condoms. Although you can get genital herpes during sexual contact even with the use of a condom, a condom can provide some protection. Avoid having multiple sexual partners. Talk with your sexual partner about any symptoms either of you may have. Also, talk with your partner about any history of STIs. Do not have sexual contact if you have active symptoms of genital herpes. Contact a health care provider if: Your symptoms are not improving with medicine. Your symptoms return, or you have new symptoms. You have a fever. You have abdominal pain. You have redness, swelling, or pain in your eye. You notice new sores on other parts of your body. You have had herpes and you become pregnant  or plan to become pregnant. Get help right away if: You have symptoms of viral meningitis. This is rare but may happen if the virus spreads to the brain. Symptoms may include: Severe headache or stiff neck. Muscle aches. Nausea and vomiting. Sensitivity to light. Summary Genital herpes is a common sexually transmitted infection (STI) that is caused by the herpes simplex virus, type 1 or type 2 (HSV-1 or HSV-2). These viruses are most often spread through sexual contact with an infected person. You are more likely to develop this condition if you have sex with many partners or you do not use condoms during sex. Most people do not have symptoms or have mild symptoms that may be mistaken for other skin problems. Symptoms occur as outbreaks that may happen months or years apart. There is no cure for this condition, but  treatment with oral antiviral medicines can reduce symptoms, reduce the chance of spreading the virus to a partner, prevent future outbreaks, or shorten future outbreaks. This information is not intended to replace advice given to you by your health care provider. Make sure you discuss any questions you have with your health care provider. Document Revised: 03/08/2021 Document Reviewed: 03/08/2021 Elsevier Patient Education  2024 Elsevier [employer]    If you have been instructed to have an in-person evaluation today at a local Urgent Care facility, please use the link below. It will take you to a list of all of our available [facility], including address, phone number and hours of operation. Please do not delay care.  [facility]  If you or a family member do not have a primary care provider, use the link below to schedule a visit and establish care. When you choose a [facility] primary care physician or advanced practice provider, you gain a long-term partner in health. Find a Primary Care Provider  Learn more about [facility]'s in-office and virtual care options: [facility] - Get Care Now

## 2022-12-10 NOTE — Progress Notes (Signed)
Virtual Visit Consent   Molly Graves, you are scheduled for a virtual visit with a [facility] provider today. Just as with appointments in the office, your consent must be obtained to participate. Your consent will be active for this visit and any virtual visit you may have with one of our providers in the next 365 days. If you have a MyChart account, a copy of this consent can be sent to you electronically.  As this is a virtual visit, video technology does not allow for your provider to perform a traditional examination. This may limit your provider's ability to fully assess your condition. If your provider identifies any concerns that need to be evaluated in person or the need to arrange testing (such as labs, EKG, etc.), we will make arrangements to do so. Although advances in technology are sophisticated, we cannot ensure that it will always work on either your end or our end. If the connection with a video visit is poor, the visit may have to be switched to a telephone visit. With either a video or telephone visit, we are not always able to ensure that we have a secure connection.  By engaging in this virtual visit, you consent to the provision of healthcare and authorize for your insurance to be billed (if applicable) for the services provided during this visit. Depending on your insurance coverage, you may receive a charge related to this service.  I need to obtain your verbal consent now. Are you willing to proceed with your visit today? Molly Graves has provided verbal consent on 12/10/2022 for a virtual visit (video or telephone). Piedad Climes, [place]  Date: 12/10/2022 8:39 AM  Virtual Visit via Video Note   I, Piedad Climes, connected with  Molly Graves  ([id number], 07-02-1990) on 12/10/22 at  8:00 AM EDT by a video-enabled telemedicine application and verified that I am speaking with the correct person using two identifiers.  Location: Patient: Virtual Visit Location  Patient: Home Provider: Virtual Visit Location Provider: Home Office   I discussed the limitations of evaluation and management by telemedicine and the availability of in person appointments. The patient expressed understanding and agreed to proceed.    History of Present Illness: Molly Graves is a 32 y.o. who identifies as a female who was assigned female at birth, and is being seen today for possible herpes outbreak.  Patient noting over the past few days having swelling and hypersensitivity of her left labia majora.  Some tingling noted.  Denies presence of a blister currently.  Denies fever or chills.  Denies vaginal discharge or vaginal pain.  Denies concern for STI.   HPI: HPI  Problems:  Patient Active Problem List   Diagnosis Date Noted   Retained products of conception following abortion    Incomplete spontaneous abortion    S/P ORIF (open reduction internal fixation) fracture IM nail left femur 02/18/2019 03/04/2019   Femur fracture (HCC) 02/17/2019   Closed fracture of femur, shaft (HCC) 02/17/2019   MDD (major depressive disorder) 10/10/2018   Severe recurrent major depression without psychotic features (HCC) 10/10/2018   Herpes genitalis 10/02/2018   Iron deficiency anemia 09/03/2018    Allergies: No Known Allergies Medications:  Current Outpatient Medications:    valACYclovir (VALTREX) 500 MG tablet, Take 1 tablet (500 mg total) by mouth 2 (two) times daily for 5 days., Disp: 10 tablet, Rfl: 0   albuterol (PROAIR HFA) 108 (90 Base) MCG/ACT inhaler, TAKE 2 PUFFS BY  MOUTH EVERY 6 HOURS AS NEEDED FOR WHEEZE OR SHORTNESS OF BREATH, Disp: , Rfl:    chlorhexidine (PERIDEX) 0.12 % solution, 2 (two) times daily., Disp: , Rfl:    FLUoxetine (PROZAC) 40 MG capsule, TAKE 1 CAPSULE BY MOUTH EVERY DAY, Disp: 30 capsule, Rfl: 5   ibuprofen (ADVIL) 800 MG tablet, Take 800 mg by mouth every 8 (eight) hours as needed., Disp: , Rfl:    norethindrone-ethinyl estradiol-FE (JUNEL FE 1/20) 1-20  MG-MCG tablet, Take 1 tablet by mouth daily., Disp: 84 tablet, Rfl: 3  Observations/Objective: Patient is well-developed, well-nourished in no acute distress.  Resting comfortably at home.  Head is normocephalic, atraumatic.  No labored breathing. Speech is clear and coherent with logical content.  Patient is alert and oriented at baseline.   Assessment and Plan: 1. Herpes simplex vulvovaginitis  Suspected start of mild outbreak.  Supportive measures and OTC medications reviewed.  Valtrex per orders.  In person follow-up for any nonresolving, new or worsening symptoms despite treatment  Follow Up Instructions: I discussed the assessment and treatment plan with the patient. The patient was provided an opportunity to ask questions and all were answered. The patient agreed with the plan and demonstrated an understanding of the instructions.  A copy of instructions were sent to the patient via MyChart unless otherwise noted below.   The patient was advised to call back or seek an in-person evaluation if the symptoms worsen or if the condition fails to improve as anticipated.  Time:  I spent 10 minutes with the patient via telehealth technology discussing the above problems/concerns.    Piedad Climes, PA-C

## 2023-01-26 ENCOUNTER — Telehealth: Payer: Medicaid Other | Admitting: Physician Assistant

## 2023-01-26 DIAGNOSIS — B3731 Acute candidiasis of vulva and vagina: Secondary | ICD-10-CM | POA: Diagnosis not present

## 2023-01-27 MED ORDER — FLUCONAZOLE 150 MG PO TABS: 150.0000 mg | ORAL_TABLET | ORAL | 0 refills | Status: DC | PRN

## 2023-01-27 NOTE — Progress Notes (Signed)

## 2023-03-01 ENCOUNTER — Other Ambulatory Visit: Payer: Self-pay | Admitting: Obstetrics

## 2023-03-01 DIAGNOSIS — Z20822 Contact with and (suspected) exposure to covid-19: Secondary | ICD-10-CM | POA: Diagnosis not present

## 2023-03-01 DIAGNOSIS — L292 Pruritus vulvae: Secondary | ICD-10-CM | POA: Diagnosis not present

## 2023-03-01 DIAGNOSIS — F321 Major depressive disorder, single episode, moderate: Secondary | ICD-10-CM

## 2023-03-01 DIAGNOSIS — U071 COVID-19: Secondary | ICD-10-CM | POA: Diagnosis not present

## 2023-03-02 DIAGNOSIS — L292 Pruritus vulvae: Secondary | ICD-10-CM | POA: Diagnosis not present

## 2023-03-02 DIAGNOSIS — Z20822 Contact with and (suspected) exposure to covid-19: Secondary | ICD-10-CM | POA: Diagnosis not present

## 2023-03-02 DIAGNOSIS — U071 COVID-19: Secondary | ICD-10-CM | POA: Diagnosis not present

## 2023-04-23 DIAGNOSIS — N898 Other specified noninflammatory disorders of vagina: Secondary | ICD-10-CM | POA: Diagnosis not present

## 2023-04-30 ENCOUNTER — Other Ambulatory Visit: Payer: Self-pay

## 2023-04-30 DIAGNOSIS — Z30011 Encounter for initial prescription of contraceptive pills: Secondary | ICD-10-CM

## 2023-04-30 MED ORDER — NORETHIN ACE-ETH ESTRAD-FE 1-20 MG-MCG PO TABS: 1.0000 | ORAL_TABLET | Freq: Every day | ORAL | 3 refills | Status: DC

## 2023-04-30 NOTE — Telephone Encounter (Signed)
Pt calling for refill of bcp; will run out before her appt on the 20th.  [phone]  Pt aware refill eRx'd.

## 2023-05-07 ENCOUNTER — Ambulatory Visit: Payer: Medicaid Other | Admitting: Licensed Practical Nurse

## 2023-05-12 ENCOUNTER — Other Ambulatory Visit: Payer: Self-pay | Admitting: Obstetrics

## 2023-05-12 DIAGNOSIS — F321 Major depressive disorder, single episode, moderate: Secondary | ICD-10-CM

## 2023-05-21 ENCOUNTER — Other Ambulatory Visit (HOSPITAL_COMMUNITY)
Admission: RE | Admit: 2023-05-21 | Discharge: 2023-05-21 | Disposition: A | Discharge status: Home / Self Care | Payer: Medicaid Other | Source: Ambulatory Visit | Attending: Licensed Practical Nurse | Admitting: Licensed Practical Nurse

## 2023-05-21 ENCOUNTER — Ambulatory Visit (INDEPENDENT_AMBULATORY_CARE_PROVIDER_SITE_OTHER): Payer: Medicaid Other | Admitting: Licensed Practical Nurse

## 2023-05-21 VITALS — BP 134/87 | HR 76 | Wt 189.9 lb

## 2023-05-21 DIAGNOSIS — F3289 Other specified depressive episodes: Secondary | ICD-10-CM

## 2023-05-21 DIAGNOSIS — Z01419 Encounter for gynecological examination (general) (routine) without abnormal findings: Secondary | ICD-10-CM

## 2023-05-21 DIAGNOSIS — Z23 Encounter for immunization: Secondary | ICD-10-CM

## 2023-05-21 DIAGNOSIS — N6452 Nipple discharge: Secondary | ICD-10-CM | POA: Diagnosis not present

## 2023-05-21 DIAGNOSIS — N814 Uterovaginal prolapse, unspecified: Secondary | ICD-10-CM

## 2023-05-21 DIAGNOSIS — N898 Other specified noninflammatory disorders of vagina: Secondary | ICD-10-CM

## 2023-05-21 DIAGNOSIS — R399 Unspecified symptoms and signs involving the genitourinary system: Secondary | ICD-10-CM

## 2023-05-21 DIAGNOSIS — Z01411 Encounter for gynecological examination (general) (routine) with abnormal findings: Secondary | ICD-10-CM

## 2023-05-21 DIAGNOSIS — Z113 Encounter for screening for infections with a predominantly sexual mode of transmission: Secondary | ICD-10-CM | POA: Diagnosis not present

## 2023-05-21 DIAGNOSIS — F32A Depression, unspecified: Secondary | ICD-10-CM

## 2023-05-21 LAB — POCT URINALYSIS DIPSTICK
Bilirubin, UA: NEGATIVE
Blood, UA: NEGATIVE
Glucose, UA: NEGATIVE
Ketones, UA: NEGATIVE
Leukocytes, UA: NEGATIVE
Nitrite, UA: NEGATIVE
Protein, UA: NEGATIVE
Spec Grav, UA: 1.02 (ref 1.010–1.025)
Urobilinogen, UA: 0.2 U/dL
pH, UA: 7.5 (ref 5.0–8.0)

## 2023-05-21 MED ORDER — FLUOXETINE HCL 20 MG PO CAPS: 20.0000 mg | ORAL_CAPSULE | Freq: Every day | ORAL | 3 refills | Status: DC

## 2023-05-21 NOTE — Progress Notes (Signed)
Gynecology Annual Exam   PCP: Molly Graves  Chief Complaint: No chief complaint on file.   History of Present Illness: Patient is a 32 y.o. [name] presents for annual exam. The patient reports:  Nipple discharge: has noticed white discharge out of her left nipple. No recent injury.  Was referred to a Uro-Gynecologist a while ago, has not gone, was told that she has a prolapse. She does have frequent urination, is not able to hold her urine. She would like another referral.  Frequent BV or yeast symptoms: currently she has an odor. She needs to shower twice a day to get rid of the odor. Her discharge is clear but there is more than there normally is. She has tried Boric Acid in the past.    LMP: No LMP recorded. (Menstrual status: Oral contraceptives). Average Interval: regular, monthly always the last week of the pack  Duration of flow: 7 days Heavy Menses: no Clots: no Intermenstrual Bleeding: no Postcoital Bleeding: no Dysmenorrhea: no  The patient is sexually active. With men She currently uses condoms and OCP (estrogen/progesterone) for contraception. She denies dyspareunia.  The patient does perform self breast exams.  There is no notable family history of breast or ovarian cancer in her family.  The patient wears seatbelts: yes.   The patient has regular exercise: no.    The patient reports current symptoms of depression.  Stopped Prozac in April. Admits to being under stress. Would like to try therapy ans restart her medication.   Works at [employer] with her 3 children, ages 59,7 and 4 PCP [place] Wears glasses, last exam 2-3 years ago Dental last exam 1 year ago   Review of Systems: Review of Systems  Constitutional:  Positive for malaise/fatigue.       Appetite change, either hungry and over eats or no appetite   HENT: Negative.    Eyes: Negative.   Respiratory: Negative.    Cardiovascular: Negative.   Gastrointestinal: Negative.    Genitourinary:  Positive for frequency.  Musculoskeletal: Negative.   Skin:        Big toe with a blue mark, has been there 1 year has not changed   Neurological:  Positive for tingling.  Endo/Heme/Allergies: Negative.   Psychiatric/Behavioral:  Positive for depression. The patient is nervous/anxious.     Past Medical History:  Patient Active Problem List   Diagnosis Date Noted Date Diagnosed   Retained products of conception following abortion     Incomplete spontaneous abortion     S/P ORIF (open reduction internal fixation) fracture IM nail left femur 02/18/2019 03/04/2019    Femur fracture (HCC) 02/17/2019    Closed fracture of femur, shaft (HCC) 02/17/2019    MDD (major depressive disorder) 10/10/2018    Severe recurrent major depression without psychotic features (HCC) 10/10/2018    Herpes genitalis 10/02/2018    Iron deficiency anemia 09/03/2018     Past Surgical History:  Past Surgical History:  Procedure Laterality Date   BREAST SURGERY     lumpectomy left breast   DIAGNOSTIC LAPAROSCOPY  2015   to rule out uterine injury after D&E   DILATION AND EVACUATION N/A 02/10/2020   Procedure: DILATATION AND EVACUATION;  Surgeon: Natale Milch, MD;  Location: [facility];  Service: Gynecology;  Laterality: N/A;   FEMUR IM NAIL Left 02/18/2019   Procedure: INTRAMEDULLARY (IM) NAIL FEMORAL;  Surgeon: Vickki Hearing, MD;  Location: [facility];  Service: Orthopedics;  Laterality: Left;  HYSTEROSCOPY WITH D & C N/A 04/19/2016   Procedure: DILATATION AND CURETTAGE /HYSTEROSCOPY;  Surgeon: Nadara Mustard, MD;  Location: [facility];  Service: Gynecology;  Laterality: N/A;   INDUCED ABORTION  2015   IUD REMOVAL N/A 04/19/2016   Procedure: INTRAUTERINE DEVICE (IUD) REMOVAL;  Surgeon: Nadara Mustard, MD;  Location: [facility];  Service: Gynecology;  Laterality: N/A;   LAPAROSCOPY N/A 04/19/2016   Procedure: LAPAROSCOPY DIAGNOSTIC;  Surgeon: Nadara Mustard, MD;  Location: [facility];   Service: Gynecology;  Laterality: N/A;    Gynecologic History:  No LMP recorded. (Menstrual status: Oral contraceptives). Contraception: OCP (estrogen/progesterone) Last Pap: Results were: ASCUS with NEGATIVE high risk HPV 2023   Obstetric History: [id number]  Family History:  Family History  Problem Relation Age of Onset   Hypertension Maternal Aunt    Hypertension Maternal Grandmother    Cancer Other 29       Cervical, Malignant   Depression Mother    Hypertension Mother    Cancer Maternal Uncle    Cancer Paternal Aunt     Social History:  Social History   Socioeconomic History   Marital status: Single    Spouse name: Not on file   Number of children: 3   Years of education: Not on file   Highest education level: Not on file  Occupational History   Not on file  Tobacco Use   Smoking status: Every Day    Types: Cigars    Last attempt to quit: 04/17/2018    Years since quitting: 5.1   Smokeless tobacco: Never  Vaping Use   Vaping status: Every Day   Substances: CBD  Substance and Sexual Activity   Alcohol use: Not Currently    Alcohol/week: 1.0 standard drink of alcohol    Types: 1 Glasses of wine per week    Comment: occ   Drug use: No   Sexual activity: Not Currently    Partners: Male  Other Topics Concern   Not on file  Social History Narrative   Not on file   Social Determinants of Health   Financial Resource Strain: High Risk (06/25/2022)   Received from [facility], [facility]   Overall Financial Resource Strain (CARDIA)    Difficulty of Paying Living Expenses: Hard  Food Insecurity: Food Insecurity Present (06/25/2022)   Received from [facility], [facility]   Hunger Vital Sign    Worried About Running Out of Food in the Last Year: Sometimes true    Ran Out of Food in the Last Year: Sometimes true  Transportation Needs: No Transportation Needs (06/25/2022)   Received from [facility], [facility]   PRAPARE - Transportation    In the past 12 months, has lack of transportation kept you from medical appointments or from getting medications?: No    Lack of Transportation (Non-Medical): No  Physical Activity: Unknown (05/01/2017)   Exercise Vital Sign    Days of Exercise per Week: 6 days    Minutes of Exercise per Session: Not on file  Stress: Stress Concern Present (12/25/2018)   [employer] of Occupational Health - Occupational Stress Questionnaire    Feeling of Stress : Rather much  Social Connections: Unknown (05/02/2022)   Received from [facility], [facility]   Social Network    Social Network: Not on file  Intimate Partner Violence: Unknown (05/02/2022)   Received from [facility], [facility]  HITS    Physically Hurt: Not on file    Insult or Talk Down To: Not on file    Threaten Physical Harm: Not on file    Scream or Curse: Not on file    Allergies:  No Known Allergies  Medications: Prior to Admission medications   Medication Sig Start Date End Date Taking? Authorizing Provider  albuterol (PROAIR HFA) 108 (90 Base) MCG/ACT inhaler TAKE 2 PUFFS BY MOUTH EVERY 6 HOURS AS NEEDED FOR WHEEZE OR SHORTNESS OF BREATH 10/03/20  Yes [provider]  FLUoxetine (PROZAC) Molly MG capsule Take 1 capsule (Molly mg total) by mouth daily. 05/21/23  Yes Raul Winterhalter, Courtney Heys, CNM  ibuprofen (ADVIL) 800 MG tablet Take 800 mg by mouth every 8 (eight) hours as needed. 04/10/22  Yes [provider]  norethindrone-ethinyl estradiol-FE (JUNEL FE 1/Molly) 1-Molly MG-MCG tablet Take 1 tablet by mouth daily. 04/30/23  Yes Lugene Beougher, Courtney Heys, CNM  chlorhexidine (PERIDEX) 0.12 % solution 2 (two) times daily. Patient not taking: Reported on 05/21/2023 04/04/22   [provider]  fluconazole (DIFLUCAN) 150 MG tablet Take 1 tablet (150 mg total) by mouth every 3 (three) days as needed. Patient not taking: Reported  on 05/21/2023 01/27/23   Margaretann Loveless, PA-C    Physical Exam Vitals: Blood pressure 134/87, pulse 76, weight 189 lb 14.4 oz (86.1 kg).  General: NAD HEENT: normocephalic, anicteric Thyroid: no enlargement, no palpable nodules Pulmonary: No increased work of breathing, CTAB Cardiovascular: RRR, distal pulses 2+ Breast: Breast symmetrical, no tenderness, no palpable nodules or masses, no skin or nipple retraction present, no nipple discharge.  No axillary or supraclavicular lymphadenopathy. Abdomen: NABS, soft, non-tender, non-distended.  Umbilicus without lesions.  No hepatomegaly, splenomegaly or masses palpable. No evidence of hernia  Genitourinary:  External: Normal external female genitalia.  Normal urethral meatus, normal Bartholin's and Skene's glands.    Vagina: Normal vaginal mucosa, movement of pelvic organs visualized when bearing down.  Good tone, movement of cervix/uterus towards the introitus palpated when pt asked to bear down.   Cervix: Grossly normal in appearance, no bleeding  Uterus: Non-enlarged, mobile, normal contour.  No CMT  Adnexa: ovaries non-enlarged, no adnexal masses  Rectal: deferred  Lymphatic: no evidence of inguinal lymphadenopathy Extremities: no edema, erythema, or tenderness Neurologic: Grossly intact Psychiatric: mood appropriate, affect full   PHQ-9 21 GAD-7 14 Assessment: 32 y.o. [id number] routine annual exam  Plan: Problem List Items Addressed This Visit   None Visit Diagnoses     Other depression    -  Primary   Relevant Medications   FLUoxetine (PROZAC) Molly MG capsule   Other Relevant Orders   Ambulatory referral to Psychology   UTI symptoms       Relevant Orders   POCT Urinalysis Dipstick (Completed)   Urine Culture (Completed)   Encounter for immunization       Relevant Orders   Flu vaccine trivalent PF, 6mos and older(Flulaval,Afluria,Fluarix,Fluzone) (Completed)   Anxiety and depression       Relevant Medications    FLUoxetine (PROZAC) Molly MG capsule   Well woman exam       Relevant Orders   Cervicovaginal ancillary only (Completed)   HEP, RPR, HIV Panel (Completed)   Prolactin (Completed)   Screening examination for venereal disease       Relevant Orders   Cervicovaginal ancillary only (Completed)   HEP, RPR, HIV Panel (Completed)   Vaginal odor       Relevant Orders  Cervicovaginal ancillary only (Completed)   Nipple discharge       Relevant Orders   Prolactin (Completed)   Uterovaginal prolapse       Relevant Orders   Ambulatory referral to Urology       2) STI screening  wasoffered and accepted  2)  ASCCP guidelines and rational discussed.  Patient opts for every 3 years screening interval  3) Contraception - the patient is currently using  OCP (estrogen/progesterone).  She is happy with her current form of contraception and plans to continue  4) Routine healthcare maintenance including cholesterol, diabetes screening discussed managed by PCP  5) RTC 4-5 weeks for mood/medication check   Carie Caddy, CNM  [facility] 05/26/2023, 10:57 AM

## 2023-05-22 LAB — HEP, RPR, HIV PANEL
HIV Screen 4th Generation wRfx: NONREACTIVE
Hepatitis B Surface Ag: NEGATIVE
RPR Ser Ql: NONREACTIVE

## 2023-05-22 LAB — PROLACTIN: Prolactin: 15.3 ng/mL (ref 4.8–33.4)

## 2023-05-23 LAB — CERVICOVAGINAL ANCILLARY ONLY
Bacterial Vaginitis (gardnerella): NEGATIVE
Candida Glabrata: NEGATIVE
Candida Vaginitis: NEGATIVE
Chlamydia: NEGATIVE
Comment: NEGATIVE
Comment: NEGATIVE
Comment: NEGATIVE
Comment: NEGATIVE
Comment: NEGATIVE
Comment: NORMAL
Neisseria Gonorrhea: NEGATIVE
Trichomonas: NEGATIVE

## 2023-05-24 LAB — URINE CULTURE

## 2023-05-26 ENCOUNTER — Telehealth: Payer: Self-pay | Admitting: Licensed Practical Nurse

## 2023-05-26 NOTE — Telephone Encounter (Signed)
TC to pt to discuss labs E-coli was found in the urine but at a low colony count, You do not have a UTI. Pt denies symptoms Your prolactin level is normal, Offered imaging for nipple discharge, pt declines  Your vaginal swab was negative, I am not sure why you are having the symptoms you are, you may consider looking into the products, underwear that you use etc. Pt verbalized understanding Jannifer Hick   [facility]  05/26/2023 5:58 PM

## 2023-05-27 ENCOUNTER — Encounter: Payer: Self-pay | Admitting: Oncology

## 2023-05-27 DIAGNOSIS — H52223 Regular astigmatism, bilateral: Secondary | ICD-10-CM | POA: Diagnosis not present

## 2023-05-30 DIAGNOSIS — H5213 Myopia, bilateral: Secondary | ICD-10-CM | POA: Diagnosis not present

## 2023-06-02 ENCOUNTER — Ambulatory Visit (INDEPENDENT_AMBULATORY_CARE_PROVIDER_SITE_OTHER): Payer: Medicaid Other | Admitting: Professional Counselor

## 2023-06-02 DIAGNOSIS — F332 Major depressive disorder, recurrent severe without psychotic features: Secondary | ICD-10-CM | POA: Diagnosis not present

## 2023-06-02 DIAGNOSIS — F1220 Cannabis dependence, uncomplicated: Secondary | ICD-10-CM
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
## 2023-06-11 ENCOUNTER — Telehealth: Payer: Medicaid Other | Admitting: Family Medicine

## 2023-06-11 DIAGNOSIS — N760 Acute vaginitis: Secondary | ICD-10-CM

## 2023-06-11 MED ORDER — METRONIDAZOLE 500 MG PO TABS: 500.0000 mg | ORAL_TABLET | Freq: Two times a day (BID) | ORAL | 0 refills | Status: AC

## 2023-06-11 NOTE — Progress Notes (Signed)
E-Visit for Vaginal Symptoms  We cannot prescribe medications outside of [place].  Medication was sent to [facility] on file, you can have medication transferred to [facility] near you.   We are sorry that you are not feeling well. Here is how we plan to help! Based on what you shared with me it looks like you: May have a vaginosis due to bacteria  Vaginosis is an inflammation of the vagina that can result in discharge, itching and pain. The cause is usually a change in the normal balance of vaginal bacteria or an infection. Vaginosis can also result from reduced estrogen levels after menopause.  The most common causes of vaginosis are:   Bacterial vaginosis which results from an overgrowth of one on several organisms that are normally present in your vagina.   Yeast infections which are caused by a naturally occurring fungus called candida.   Vaginal atrophy (atrophic vaginosis) which results from the thinning of the vagina from reduced estrogen levels after menopause.   Trichomoniasis which is caused by a parasite and is commonly transmitted by sexual intercourse.  Factors that increase your risk of developing vaginosis include: Medications, such as antibiotics and steroids Uncontrolled diabetes Use of hygiene products such as bubble bath, vaginal spray or vaginal deodorant Douching Wearing damp or tight-fitting clothing Using an intrauterine device (IUD) for birth control Hormonal changes, such as those associated with pregnancy, birth control pills or menopause Sexual activity Having a sexually transmitted infection  Your treatment plan is Metronidazole or Flagyl 500mg  twice a day for 7 days.  I have electronically sent this prescription into the pharmacy that you have chosen.  Be sure to take all of the medication as directed. Stop taking any medication if you develop a rash, tongue swelling or shortness of breath. Mothers who are breast feeding should consider pumping and  discarding their breast milk while on these antibiotics. However, there is no consensus that infant exposure at these doses would be harmful.  Remember that medication creams can weaken latex condoms. Marland Kitchen   HOME CARE:  Good hygiene may prevent some types of vaginosis from recurring and may relieve some symptoms:  Avoid baths, hot tubs and whirlpool spas. Rinse soap from your outer genital area after a shower, and dry the area well to prevent irritation. Don't use scented or harsh soaps, such as those with deodorant or antibacterial action. Avoid irritants. These include scented tampons and pads. Wipe from front to back after using the toilet. Doing so avoids spreading fecal bacteria to your vagina.  Other things that may help prevent vaginosis include:  Don't douche. Your vagina doesn't require cleansing other than normal bathing. Repetitive douching disrupts the normal organisms that reside in the vagina and can actually increase your risk of vaginal infection. Douching won't clear up a vaginal infection. Use a latex condom. Both female and female latex condoms may help you avoid infections spread by sexual contact. Wear cotton underwear. Also wear pantyhose with a cotton crotch. If you feel comfortable without it, skip wearing underwear to bed. Yeast thrives in [employer] Your symptoms should improve in the next day or two.  GET HELP RIGHT AWAY IF:  You have pain in your lower abdomen ( pelvic area or over your ovaries) You develop nausea or vomiting You develop a fever Your discharge changes or worsens You have persistent pain with intercourse You develop shortness of breath, a rapid pulse, or you faint.  These symptoms could be signs of problems or infections  that need to be evaluated by a medical provider now.  MAKE SURE YOU   Understand these instructions. Will watch your condition. Will get help right away if you are not doing well or get worse.  Thank you for choosing  an e-visit.  Your e-visit answers were reviewed by a board certified advanced clinical practitioner to complete your personal care plan. Depending upon the condition, your plan could have included both over the counter or prescription medications.  Please review your pharmacy choice. Make sure the pharmacy is open so you can pick up prescription now. If there is a problem, you may contact your provider through [employer] and have the prescription routed to another pharmacy.  Your safety is important to [place]. If you have drug allergies check your prescription carefully.   For the next 24 hours you can use MyChart to ask questions about today's visit, request a non-urgent call back, or ask for a work or school excuse. You will get an email in the next two days asking about your experience. I hope that your e-visit has been valuable and will speed your recovery.  I have spent 5 minutes in review of e-visit questionnaire, review and updating patient chart, medical decision making and response to patient.   Reed Pandy, PA-C

## 2023-06-19 ENCOUNTER — Encounter: Payer: Self-pay | Admitting: Psychiatry

## 2023-06-19 ENCOUNTER — Other Ambulatory Visit: Payer: Self-pay | Admitting: Licensed Practical Nurse

## 2023-06-19 ENCOUNTER — Ambulatory Visit: Payer: Medicaid Other | Admitting: Psychiatry

## 2023-06-19 VITALS — BP 108/82 | HR 67 | Temp 98.3°F | Ht 63.0 in | Wt 188.2 lb

## 2023-06-19 DIAGNOSIS — F331 Major depressive disorder, recurrent, moderate: Secondary | ICD-10-CM | POA: Insufficient documentation

## 2023-06-19 DIAGNOSIS — F1290 Cannabis use, unspecified, uncomplicated: Secondary | ICD-10-CM

## 2023-06-19 DIAGNOSIS — Z8659 Personal history of other mental and behavioral disorders: Secondary | ICD-10-CM | POA: Insufficient documentation

## 2023-06-19 DIAGNOSIS — Z634 Disappearance and death of family member: Secondary | ICD-10-CM | POA: Insufficient documentation

## 2023-06-19 DIAGNOSIS — F419 Anxiety disorder, unspecified: Secondary | ICD-10-CM

## 2023-06-19 DIAGNOSIS — F411 Generalized anxiety disorder: Secondary | ICD-10-CM | POA: Insufficient documentation
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
## 2023-06-23 ENCOUNTER — Ambulatory Visit: Payer: Self-pay | Admitting: Urology

## 2023-06-25 DIAGNOSIS — H5203 Hypermetropia, bilateral: Secondary | ICD-10-CM | POA: Diagnosis not present

## 2023-06-26 ENCOUNTER — Ambulatory Visit (INDEPENDENT_AMBULATORY_CARE_PROVIDER_SITE_OTHER): Payer: Medicaid Other | Admitting: Professional Counselor

## 2023-06-26 DIAGNOSIS — F331 Major depressive disorder, recurrent, moderate: Secondary | ICD-10-CM | POA: Diagnosis not present

## 2023-06-26 NOTE — Progress Notes (Signed)
   THERAPIST PROGRESS NOTE  Session Time: 1:05 PM - 1:55 PM  Participation Level: Active  Behavioral Response: CasualAlertEuthymic  Type of Therapy: Individual Therapy  Treatment Goals addressed: Active    OP Depression   LTG: I guess to control emotions and get this anxiety and depression under control. Learn to set healthy boundaries in a non-aggressive way.     Start:  06/26/23    Expected End:  06/24/24     LTG: Reduce frequency, intensity, and duration of depression symptoms so that daily functioning is improved   STG:I guess dealing with my emotions. Being able to control that I guess. Improve ability to identify emotions, utilize emotion regulation skills, and reduce negative behaviors attached to emotions.    Start:  06/26/23    Expected End:  06/24/24     Therapist will administer the PHQ-9 during scheduled session over the next 12 weeks.   ProgressTowards Goals: Initial  Interventions: CBT and Motivational Interviewing  Suicidal/Homicidal: No  Summary: CING Bernalillo is a 33 y.o. female who presents with MDD, GAD, and cannabis use. This is her first therapy session since completing her CCA. Eilee states things are going okay. She reported she went to [place]  for Christmas. She was able to talk about some things with her mom and sister which helped them process and connect in some way. Andera notes some frustration with her aunt and cousin sharing information about her with other people and not talking to her first. She is unsure about how to address that with them. She is open to learning some skills to help with communication in future sessions. Chistine is planning to start school next week. She expressed concerns with finances due to still being out of work but notes child support and upcoming tax/school refunds will be helpful. She took notes of additional local resources that may be helpful as well. [place] contributed to the development of her treatment plan to focus  on emotion regulation and managing her symptoms. She will be seen again in two weeks.   Therapist Response: Conducted therapy session in office with Theone. Used open-ended questions to facilitate discussion and gain updates since initial CCA. Utilized empathetic and reflective listening skills. Provided some resources for financial assistance and educational supplies. Developed treatment plan with input from Keono on current strengths, needs, and progress towards goals. Scheduled next session.   Plan: Return again in 2 weeks.  Diagnosis: MDD (major depressive disorder), recurrent episode, moderate (HCC)  Collaboration of Care: Medication Management AEB chart review  Patient/Guardian was advised Release of Information must be obtained prior to any record release in order to collaborate their care with an outside provider. Patient/Guardian was advised if they have not already done so to contact the registration department to sign all necessary forms in order for us  to release information regarding their care.   Consent: Patient/Guardian gives verbal consent for treatment and assignment of benefits for services provided during this visit. Patient/Guardian expressed understanding and agreed to proceed.   Almarie JONETTA Ligas, [facility] 06/26/2023

## 2023-07-08 ENCOUNTER — Telehealth: Payer: Self-pay

## 2023-07-08 NOTE — Telephone Encounter (Signed)
pt has medicaid and the pt would have to be self pay. they don't accept medicaid.

## 2023-07-09 ENCOUNTER — Ambulatory Visit (INDEPENDENT_AMBULATORY_CARE_PROVIDER_SITE_OTHER): Payer: Medicaid Other | Admitting: Professional Counselor

## 2023-07-09 DIAGNOSIS — F331 Major depressive disorder, recurrent, moderate: Secondary | ICD-10-CM

## 2023-07-09 NOTE — Progress Notes (Signed)
THERAPIST PROGRESS NOTE  Virtual Visit via Video Note  I connected with Molly Graves on 07/09/23 at 11:00 AM EST by a video enabled telemedicine application and verified that I am speaking with the correct person using two identifiers.  Location: Patient: Home Provider: Office   I discussed the limitations of evaluation and management by telemedicine and the availability of in person appointments. The patient expressed understanding and agreed to proceed.   I discussed the assessment and treatment plan with the patient. The patient was provided an opportunity to ask questions and all were answered. The patient agreed with the plan and demonstrated an understanding of the instructions.   The patient was advised to call back or seek an in-person evaluation if the symptoms worsen or if the condition fails to improve as anticipated.  I provided 53 minutes of non-face-to-face time during this encounter. Molly Graves, [facility] Session Time: 11:00 AM - 11:53 AM  Participation Level: Active  Behavioral Response: Casual, Alert, Anxious and Depressed  Type of Therapy: Individual Therapy  Treatment Goals addressed: Active OP Depression    LTG: "I guess to control emotions and get this anxiety and depression under control. Learn to set healthy boundaries in a non-aggressive way."                 Start:  06/26/23    Expected End:  06/24/24      LTG: Reduce frequency, intensity, and duration of depression symptoms so that daily functioning is improved    STG:"I guess dealing with my emotions. Being able to control that I guess." Improve ability to identify emotions, utilize emotion regulation skills, and reduce negative behaviors attached to emotio  ProgressTowards Goals: Progressing  Interventions: CBT and Motivational Interviewing  Summary: Molly Graves is a 33 y.o. female who presents with MDD and GAD. She stated she has mostly been focusing on school since last session. She  was able to borrow some money to pay some bills, but is still behind on most of them. Her phone is currently off but she can use it on Wi-Fi which she paid for. [name] will try to access additional resources for assistance. She discussed a current relationship, pros/cons, and noted a lack of self-worth/respect that she feels makes her continue to engage with this person. She can identify some good factors within him and the relationship but is also aware of the bad factors. She noted over thinking that contributes to her stress and impacts her focus on school and other things. Molly Graves's audio and video dropped at the end of session but she was scheduled in two weeks since that has been her current scheduling pattern.   Therapist Response: Conducted telehealth session with [employer]. Began session with check-in/update since previous session. Used empathetic and reflective listening. Explored more potential resources for financial assistance. Used open-ended questions to facilitate discussion and summarized Molly Graves's thoughts/feelings. Explored concepts of healthy relationships and contributing factors to [place] engaging in a relationship that appears unhealthy. Scheduled follow-up appointment.  Suicidal/Homicidal: No  Plan: Return again in 2 weeks.  Diagnosis: MDD (major depressive disorder), recurrent episode, moderate (HCC)  Collaboration of Care: Medication Management AEB chart review  Patient/Guardian was advised Release of Information must be obtained prior to any record release in order to collaborate their care with an outside provider. Patient/Guardian was advised if they have not already done so to contact the registration department to sign all necessary forms in order for [place] to release information regarding their  care.   Consent: Patient/Guardian gives verbal consent for treatment and assignment of benefits for services provided during this visit. Patient/Guardian expressed understanding and agreed to  proceed.   Molly Graves, [facility] 07/09/2023

## 2023-07-10 NOTE — Telephone Encounter (Signed)
Please check with patient if she is interested in referral to Dr.Rodenbough or [facility] in [place] for testing.

## 2023-07-11 NOTE — Telephone Encounter (Signed)
Please send out a [employer] .

## 2023-07-11 NOTE — Telephone Encounter (Signed)
tried to call twice and each time is states call can not be completed at this time and cuts off.

## 2023-07-14 NOTE — Telephone Encounter (Signed)
message was sent to patient in mychart. pending.

## 2023-07-23 ENCOUNTER — Ambulatory Visit (INDEPENDENT_AMBULATORY_CARE_PROVIDER_SITE_OTHER): Payer: Medicaid Other | Admitting: Professional Counselor

## 2023-07-23 DIAGNOSIS — Z91199 Patient's noncompliance with other medical treatment and regimen due to unspecified reason: Secondary | ICD-10-CM

## 2023-07-23 NOTE — Progress Notes (Signed)
 Patient rescheduled today's appointment due to daughter being hospitalized; appointment was for 07/23/23 at 11 AM for follow-up therapy, patient agrees to reschedule missed appointment.

## 2023-08-13 ENCOUNTER — Ambulatory Visit (INDEPENDENT_AMBULATORY_CARE_PROVIDER_SITE_OTHER): Payer: Medicaid Other | Admitting: Professional Counselor

## 2023-08-13 DIAGNOSIS — F331 Major depressive disorder, recurrent, moderate: Secondary | ICD-10-CM

## 2023-08-13 DIAGNOSIS — F411 Generalized anxiety disorder: Secondary | ICD-10-CM

## 2023-08-13 NOTE — Progress Notes (Unsigned)
 THERAPIST PROGRESS NOTE  Virtual Visit via Video Note  I connected with Molly Graves on 08/13/23 at  1:00 PM EST by a video enabled telemedicine application and verified that I am speaking with the correct person using two identifiers.  Location: Patient: Home Provider: Office   I discussed the limitations of evaluation and management by telemedicine and the availability of in person appointments. The patient expressed understanding and agreed to proceed.   I discussed the assessment and treatment plan with the patient. The patient was provided an opportunity to ask questions and all were answered. The patient agreed with the plan and demonstrated an understanding of the instructions.   The patient was advised to call back or seek an in-person evaluation if the symptoms worsen or if the condition fails to improve as anticipated.  I provided 53 minutes of non-face-to-face time during this encounter. Edmonia Lynch, [facility]  Session Time: 1:05 PM - 1:58 PM  Participation Level: Active  Behavioral Response: Casual, Alert, Dysphoric  Type of Therapy: Individual Therapy  Treatment Goals addressed: Active OP Depression   LTG: "I guess to control emotions and get this anxiety and depression under control. Learn to set healthy boundaries in a non-aggressive way."                 Start:  06/26/23    Expected End:  06/24/24      LTG: Reduce frequency, intensity, and duration of depression symptoms so that daily functioning is improved    STG:"I guess dealing with my emotions. Being able to control that I guess." Improve ability to identify emotions, utilize emotion regulation skills, and reduce negative behaviors attached to emotions.           Therapist will administer the PHQ-9 during scheduled session over the next 12 weeks.  ProgressTowards Goals: Progressing  Interventions: CBT  Summary: Molly Graves is a 33 y.o. female who presents with a history of anxiety and  depression. She stated things are going okay. She has been focused on school. She reported she is failing one class. Molly Graves engaged in discussion about time management and other ways to improve her grades and medication compliance. She discussed her daughter being hospitalized for self-harm. She noted her daughter is doing better and they hope to get intensive in-home services for her. [name] expresses concerns about her paranoid thinking. She was ambivalent towards mind reading as she reports a history of negative behaviors of others. [name] discussed the tendency for her family to sweep everything under the rug. She expressed some interest in DEAR MAN skill but would want to practice  more.   Therapist Response: Conducted telehealth session with Molly Graves. Began session with check-in/update since previous session. Utilized empathetic and reflective listening. Explored skills such as time management and placing meds on nightstand to improve functioning/school. Engaged in discussion and noted a pattern of mind reading. Briefly explained DEAR MAN skill to improve communication and objectiveness effectiveness. Made plan to discuss further and practice. Scheduled additional appointment and concluded session.   Suicidal/Homicidal: No   Plan: Return again in 4 weeks.  Diagnosis: No diagnosis found.  Collaboration of Care: Medication Management AEB chart review  Patient/Guardian was advised Release of Information must be obtained prior to any record release in order to collaborate their care with an outside provider. Patient/Guardian was advised if they have not already done so to contact the registration department to sign all necessary forms in order for [place] to release information regarding  their care.   Consent: Patient/Guardian gives verbal consent for treatment and assignment of benefits for services provided during this visit. Patient/Guardian expressed understanding and agreed to proceed.   Edmonia Lynch, [facility] 08/13/2023

## 2023-08-14 ENCOUNTER — Ambulatory Visit: Payer: Medicaid Other | Admitting: Psychiatry

## 2023-08-18 ENCOUNTER — Telehealth: Admitting: Nurse Practitioner

## 2023-08-18 DIAGNOSIS — N760 Acute vaginitis: Secondary | ICD-10-CM

## 2023-08-18 DIAGNOSIS — B9689 Other specified bacterial agents as the cause of diseases classified elsewhere: Secondary | ICD-10-CM

## 2023-08-18 MED ORDER — METRONIDAZOLE 0.75 % VA GEL: 1.0000 | Freq: Every day | VAGINAL | 0 refills | Status: AC

## 2023-08-18 NOTE — Progress Notes (Signed)

## 2023-08-25 ENCOUNTER — Ambulatory Visit (INDEPENDENT_AMBULATORY_CARE_PROVIDER_SITE_OTHER): Payer: Self-pay | Admitting: Urology

## 2023-08-25 ENCOUNTER — Encounter: Payer: Self-pay | Admitting: Urology

## 2023-08-25 VITALS — BP 117/71 | HR 76 | Ht 63.0 in | Wt 202.2 lb

## 2023-08-25 DIAGNOSIS — N3941 Urge incontinence: Secondary | ICD-10-CM

## 2023-08-25 DIAGNOSIS — N814 Uterovaginal prolapse, unspecified: Secondary | ICD-10-CM

## 2023-08-25 LAB — URINALYSIS, COMPLETE
Bilirubin, UA: NEGATIVE
Glucose, UA: NEGATIVE
Ketones, UA: NEGATIVE
Leukocytes,UA: NEGATIVE
Nitrite, UA: NEGATIVE
Protein,UA: NEGATIVE
Specific Gravity, UA: 1.02 (ref 1.005–1.030)
Urobilinogen, Ur: 0.2 mg/dL (ref 0.2–1.0)
pH, UA: 6.5 (ref 5.0–7.5)

## 2023-08-25 LAB — MICROSCOPIC EXAMINATION

## 2023-08-25 MED ORDER — GEMTESA 75 MG PO TABS: 75.0000 mg | ORAL_TABLET | Freq: Every day | ORAL | Status: AC

## 2023-08-25 NOTE — Progress Notes (Signed)
 08/25/2023 11:04 AM   Thera Flake Likes [birth year] [id number]  Referring provider: Ellwood Sayers, CNM [address] [place],  [place] [address]  No chief complaint on file.   HPI: I was consulted to assess the patient's urinary incontinence.  She can have urge incontinence if her bladder is full.  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  Now that she is not working she is pad free.  She does not leak with coughing sneezing.  She might leak with bending lifting her bladder is full but.  No bedwetting.  Can void every 30 to 60 minutes and could hold for 2 hours depending on fluid intake.  Gets up once at night.  Flow was reasonable  Gets 1 bladder infection a year.  She thinks she has had 2 kidney infections.  No hysterectomy  No history of kidney stones or bladder surgery.  Bowel movements normal.  No neurologic issues   PMH: Past Medical History:  Diagnosis Date   Anemia    Back pain affecting pregnancy in third trimester 11/17/2018   Complication of intrauterine device (IUD) (HCC) 04/19/2016   Depression    post partum depression   Dysuria 12/13/2018   Headache    MIGRAINES   Herpes genitalia    Normal vaginal delivery 12/25/2018   Postpartum care following vaginal delivery 12/25/2018   Pyelonephritis affecting pregnancy in second trimester 08/08/2018   Supervision of other normal pregnancy, antepartum 05/22/2018   [facility] Prenatal Labs Dating [place] Blood type: O/Positive/-- (12/23 1605)  Genetic Screen declines Antibody:Negative (12/23 1605) Anatomic [place]  incomplete Rubella: 1.86 (12/23 1605) Varicella:   GTT Third trimester:  RPR: Non Reactive (12/23 1605)  Rhogam O+ HBsAg: Negative (12/23 1605)  TDaP vaccine              Flu Shot: HIV: Non Reactive (12/23 1605)  Baby Food                                  Surgical History: Past Surgical History:  Procedure Laterality Date   BREAST SURGERY     lumpectomy left breast   DIAGNOSTIC LAPAROSCOPY  2015    to rule out uterine injury after D&E   DILATION AND EVACUATION N/A 02/10/2020   Procedure: DILATATION AND EVACUATION;  Surgeon: Natale Milch, MD;  Location: [facility] ORS;  Service: Gynecology;  Laterality: N/A;   FEMUR IM NAIL Left 02/18/2019   Procedure: INTRAMEDULLARY (IM) NAIL FEMORAL;  Surgeon: Vickki Hearing, MD;  Location: [facility];  Service: Orthopedics;  Laterality: Left;   HYSTEROSCOPY WITH D & C N/A 04/19/2016   Procedure: DILATATION AND CURETTAGE /HYSTEROSCOPY;  Surgeon: Nadara Mustard, MD;  Location: [facility] ORS;  Service: Gynecology;  Laterality: N/A;   INDUCED ABORTION  2015   IUD REMOVAL N/A 04/19/2016   Procedure: INTRAUTERINE DEVICE (IUD) REMOVAL;  Surgeon: Nadara Mustard, MD;  Location: [facility] ORS;  Service: Gynecology;  Laterality: N/A;   LAPAROSCOPY N/A 04/19/2016   Procedure: LAPAROSCOPY DIAGNOSTIC;  Surgeon: Nadara Mustard, MD;  Location: [facility] ORS;  Service: Gynecology;  Laterality: N/A;    Home Medications:  Allergies as of 08/25/2023   No Known Allergies      Medication List        Accurate as of August 25, 2023 11:04 AM. If you have any questions, ask your nurse or doctor.  FLUoxetine 20 MG capsule Commonly known as: PROZAC TAKE 1 CAPSULE BY MOUTH EVERY DAY   ibuprofen 800 MG tablet Commonly known as: ADVIL Take 800 mg by mouth every 8 (eight) hours as needed.   metroNIDAZOLE 0.75 % vaginal gel Commonly known as: METROGEL Place 1 Applicatorful vaginally at bedtime for 7 days.   norethindrone-ethinyl estradiol-FE 1-20 MG-MCG tablet Commonly known as: Junel FE 1/20 Take 1 tablet by mouth daily.        Allergies: No Known Allergies  Family History: Family History  Problem Relation Age of Onset   Bipolar disorder Mother    Hypertension Mother    Hypertension Maternal Aunt    Cancer Paternal Aunt    Cancer Maternal Uncle    Schizophrenia Maternal Grandfather    Hypertension Maternal Grandmother    ODD Daughter    Autism Son     Cancer Other 29       Cervical, Malignant    Social History:  reports that she has been smoking cigars. She has never used smokeless tobacco. She reports that she does not currently use alcohol after a past usage of about 1.0 standard drink of alcohol per week. She reports that she does not use drugs.  ROS:                                        Physical Exam: There were no vitals taken for this visit.  Constitutional:  Alert and oriented, No acute distress. HEENT:  AT, moist mucus membranes.  Trachea midline, no masses. Cardiovascular: No clubbing, cyanosis, or edema. Respiratory: Normal respiratory effort, no increased work of breathing. GI: Abdomen is soft, nontender, nondistended, no abdominal masses GU: Mild grade 2 hide bili of bladder neck and negative cough test.  No prolapse Skin: No rashes, bruises or suspicious lesions. Lymph: No cervical or inguinal adenopathy. Neurologic: Grossly intact, no focal deficits, moving all 4 extremities. Psychiatric: Normal mood and affect.  Laboratory Data: Lab Results  Component Value Date   WBC 6.6 08/10/2021   HGB 11.7 08/10/2021   HCT 35.7 08/10/2021   MCV 88 08/10/2021   PLT 294 07/21/2021    Lab Results  Component Value Date   CREATININE 0.87 07/21/2021    No results found for: "PSA"  No results found for: "TESTOSTERONE"  No results found for: "HGBA1C"  Urinalysis    Component Value Date/Time   COLORURINE YELLOW 07/21/2021 1001   APPEARANCEUR CLEAR 07/21/2021 1001   APPEARANCEUR Clear 06/20/2013 1711   LABSPEC 1.020 07/21/2021 1001   LABSPEC 1.025 06/20/2013 1711   PHURINE 7.0 07/21/2021 1001   GLUCOSEU NEGATIVE 07/21/2021 1001   GLUCOSEU Negative 06/20/2013 1711   HGBUR NEGATIVE 07/21/2021 1001   BILIRUBINUR Negative 05/21/2023 1421   BILIRUBINUR Negative 06/20/2013 1711   KETONESUR negative 05/06/2022 1751   KETONESUR 15 (A) 07/21/2021 1001   PROTEINUR Negative 05/21/2023 1421    PROTEINUR TRACE (A) 07/21/2021 1001   UROBILINOGEN 0.2 05/21/2023 1421   NITRITE Negative 05/21/2023 1421   NITRITE NEGATIVE 07/21/2021 1001   LEUKOCYTESUR Negative 05/21/2023 1421   LEUKOCYTESUR NEGATIVE 07/21/2021 1001   LEUKOCYTESUR Negative 06/20/2013 1711    Pertinent Imaging: Urine reviewed and sent for culture.  Chart reviewed  Assessment & Plan: Patient primarily has mild urge incontinence and significant frequency.  Role of physical therapy and medication discussed.  Reassess in 6 weeks on Gemtesa samples and prescription.  PT consult sent.  If we retrain bladder I will stop medication in the future and this was discussed.  Proceed accordingly  1. Uterovaginal prolapse (Primary)  - Urinalysis, Complete   No follow-ups on file.  Martina Sinner, MD  [facility] [address] [place], [place] [address] [phone]

## 2023-08-27 DIAGNOSIS — Z3202 Encounter for pregnancy test, result negative: Secondary | ICD-10-CM | POA: Diagnosis not present

## 2023-08-27 DIAGNOSIS — T162XXA Foreign body in left ear, initial encounter: Secondary | ICD-10-CM | POA: Diagnosis not present

## 2023-08-27 DIAGNOSIS — T161XXA Foreign body in right ear, initial encounter: Secondary | ICD-10-CM | POA: Diagnosis not present

## 2023-09-12 ENCOUNTER — Ambulatory Visit: Payer: Medicaid Other | Admitting: Professional Counselor

## 2023-09-12 DIAGNOSIS — F331 Major depressive disorder, recurrent, moderate: Secondary | ICD-10-CM | POA: Diagnosis not present

## 2023-09-12 DIAGNOSIS — F411 Generalized anxiety disorder: Secondary | ICD-10-CM

## 2023-09-12 NOTE — Progress Notes (Signed)
 THERAPIST PROGRESS NOTE  Virtual Visit via Video Note  I connected with Molly Graves on 09/12/23 at  8:00 AM EDT by a video enabled telemedicine application and verified that I am speaking with the correct person using two identifiers.  Location: Patient: Home Provider: Home office   I discussed the limitations of evaluation and management by telemedicine and the availability of in person appointments. The patient expressed understanding and agreed to proceed.   I discussed the assessment and treatment plan with the patient. The patient was provided an opportunity to ask questions and all were answered. The patient agreed with the plan and demonstrated an understanding of the instructions.   The patient was advised to call back or seek an in-person evaluation if the symptoms worsen or if the condition fails to improve as anticipated.  I provided 48 minutes of non-face-to-face time during this encounter. Edmonia Lynch, [facility]  Session Time: 8:40 AM - 9:28 AM  Participation Level: Active  Behavioral Response: Casual, Alert, Dysphoric, Hopeless, and Irritable  Type of Therapy: Individual Therapy  Treatment Goals addressed: Active OP Depression   LTG: "I guess to control emotions and get this anxiety and depression under control. Learn to set healthy boundaries in a non-aggressive way."                 Start:  06/26/23    Expected End:  06/24/24      LTG: Reduce frequency, intensity, and duration of depression symptoms so that daily functioning is improved    STG:"I guess dealing with my emotions. Being able to control that I guess." Improve ability to identify emotions, utilize emotion regulation skills, and reduce negative behaviors attached to emotions.           Therapist will administer the PHQ-9 during scheduled session over the next 12 weeks.  ProgressTowards Goals: Progressing  Interventions: CBT, Motivational Interviewing, Supportive, and Family  Systems  Summary: Molly Graves is a 33 y.o. female who presents with a history of anxiety and depression. She appeared alert and oriented x5. She expressed frustration about daughter's mental health and suicide attempt. Sacoya explained circumstances behind her daughter's most recent attempt. She noted her struggles with parenting and expressed interest in parenting classes. She is also looking forward to IIH to help with her daughter. She noted it was helpful in the past. She appeared receptive to tips on parenting presented in session. Keierra noted positives she has had outside of this struggle (doing well in school, removed herself from toxic relationship).  Therapist Response: Conducted telehealth session with [employer]. Began session with check-in/update since previous session. Utilized empathetic and reflective listening. Used gentle confrontation to highlight Estefanie's emotions and responses in daughter's vulnerable situation. Explained different parenting styles and benefits of authoritative parenting. Validated Carin's frustration and highlighted importance of emotion regulation. Explored options for parenting classes ([facility] [place]) and encouraged Lynnea to access the resources through her daughter's care Production designer, theatre/television/film with [facility]. Explained benefits of positive reinforcement over punishment/negative reinforcement. Scheduled additional appointment and concluded session.   Suicidal/Homicidal: No  Plan: Return again in 2 weeks.  Diagnosis: MDD (major depressive disorder), recurrent episode, moderate (HCC)  GAD (generalized anxiety disorder)  Collaboration of Care: Medication management AEB chart review, Contacted [facility] [place] about parenting classes (only offer in-person, will f/u with pt about referral due to transportation issues)  Patient/Guardian was advised Release of Information must be obtained prior to any record release in order to  collaborate their care  with an outside provider. Patient/Guardian was advised if they have not already done so to contact the registration department to sign all necessary forms in order for [place] to release information regarding their care.   Consent: Patient/Guardian gives verbal consent for treatment and assignment of benefits for services provided during this visit. Patient/Guardian expressed understanding and agreed to proceed.   Edmonia Lynch, [facility] 09/12/2023

## 2023-09-17 DIAGNOSIS — Z20822 Contact with and (suspected) exposure to covid-19: Secondary | ICD-10-CM | POA: Diagnosis not present

## 2023-09-17 DIAGNOSIS — R070 Pain in throat: Secondary | ICD-10-CM | POA: Diagnosis not present

## 2023-09-17 DIAGNOSIS — H6692 Otitis media, unspecified, left ear: Secondary | ICD-10-CM | POA: Diagnosis not present

## 2023-09-26 ENCOUNTER — Ambulatory Visit: Payer: Medicaid Other | Admitting: Professional Counselor

## 2023-09-26 DIAGNOSIS — F331 Major depressive disorder, recurrent, moderate: Secondary | ICD-10-CM

## 2023-09-26 NOTE — Progress Notes (Signed)
 THERAPIST PROGRESS NOTE  Virtual Visit via Video Note  I connected with Molly Graves on 09/26/23 at 11:00 AM EDT by a video enabled telemedicine application and verified that I am speaking with the correct person using two identifiers.  Location: Patient: Home Provider: Home office   I discussed the limitations of evaluation and management by telemedicine and the availability of in person appointments. The patient expressed understanding and agreed to proceed.   I discussed the assessment and treatment plan with the patient. The patient was provided an opportunity to ask questions and all were answered. The patient agreed with the plan and demonstrated an understanding of the instructions.   The patient was advised to call back or seek an in-person evaluation if the symptoms worsen or if the condition fails to improve as anticipated.  I provided 55 minutes of non-face-to-face time during this encounter. Molly Graves, [facility]  Session Time: 11:01 AM - 11:56 AM  Participation Level: Active  Behavioral Response: Casual, Alert, Dysphoric  Type of Therapy: Individual Therapy  Treatment Goals addressed: Active OP Depression   LTG: "I guess to control emotions and get this anxiety and depression under control. Learn to set healthy boundaries in a non-aggressive way."                 Start:  06/26/23    Expected End:  06/24/24      LTG: Reduce frequency, intensity, and duration of depression symptoms so that daily functioning is improved    STG:"I guess dealing with my emotions. Being able to control that I guess." Improve ability to identify emotions, utilize emotion regulation skills, and reduce negative behaviors attached to emotions.           Therapist will administer the PHQ-9 during scheduled session over the next 12 weeks.  ProgressTowards Goals: Progressing  Interventions: CBT, Motivational Interviewing, and Supportive  Summary: Molly Graves is a 33 y.o. female  who presents with a history of anxiety and depression. She appeared alert and oriented x5. She stated things are going okay. Her daughter is home and has had some improvements. Molly Graves reported they are on a wait list for IIH and her daughter is doing weekly group therapy. She expressed continued interest in parenting classes and will talk with her case worker when she comes to visit soon. Molly Graves discussed school and expressed thoughts about how she "should" have made different choices and be further in life. She was receptive to Socratic questioning and noted other contributing factors to her decisions besides failing/quitting. Molly Graves was able to restructure that negative thinking and reported it does feel better. She has been trying to let things go that she can't control and focus on each day.   Therapist Response: Conducted session with Molly Graves. Began session with check-in/update since previous session. Utilized empathetic and reflective listening. Processed Molly Graves's thoughts about her progress in life. Highlighted unhealthy thinking patterns and used Socratic questioning to help Molly Graves challenge thinking. Assisted with restructuring thoughts. Scheduled additional appointment and concluded session.   Suicidal/Homicidal: No  Plan: Return again in 2 weeks.  Diagnosis: MDD (major depressive disorder), recurrent episode, moderate (HCC)  Collaboration of Care: Medication Management AEB chart review  Patient/Guardian was advised Release of Information must be obtained prior to any record release in order to collaborate their care with an outside provider. Patient/Guardian was advised if they have not already done so to contact the registration department to sign all necessary forms in order for [place] to release  information regarding their care.   Consent: Patient/Guardian gives verbal consent for treatment and assignment of benefits for services provided during this visit. Patient/Guardian expressed understanding  and agreed to proceed.   Molly Graves, [facility] 09/26/2023

## 2023-10-13 ENCOUNTER — Ambulatory Visit (INDEPENDENT_AMBULATORY_CARE_PROVIDER_SITE_OTHER): Payer: Self-pay | Admitting: Professional Counselor

## 2023-10-13 DIAGNOSIS — F331 Major depressive disorder, recurrent, moderate: Secondary | ICD-10-CM

## 2023-10-13 DIAGNOSIS — F411 Generalized anxiety disorder: Secondary | ICD-10-CM

## 2023-10-13 NOTE — Progress Notes (Signed)
 THERAPIST PROGRESS NOTE  Virtual Visit via Video Note  I connected with [name] on 10/13/23 at  3:00 PM EDT by a video enabled telemedicine application and verified that I am speaking with the correct person using two identifiers.  Location: Patient: Community Provider: Office   I discussed the limitations of evaluation and management by telemedicine and the availability of in person appointments. The patient expressed understanding and agreed to proceed.   I discussed the assessment and treatment plan with the patient. The patient was provided an opportunity to ask questions and all were answered. The patient agreed with the plan and demonstrated an understanding of the instructions.   The patient was advised to call back or seek an in-person evaluation if the symptoms worsen or if the condition fails to improve as anticipated.  I provided 29 minutes of non-face-to-face time during this encounter. Len Quale, [facility]  Session Time: 3:03 PM - 3:32 PM  Participation Level: Active  Behavioral Response: Casual, Alert, Anxious  Type of Therapy: Individual Therapy  Treatment Goals addressed: Active OP Depression   LTG: "I guess to control emotions and get this anxiety and depression under control. Learn to set healthy boundaries in a non-aggressive way."                 Start:  06/26/23    Expected End:  06/24/24      LTG: Reduce frequency, intensity, and duration of depression symptoms so that daily functioning is improved    STG:"I guess dealing with my emotions. Being able to control that I guess." Improve ability to identify emotions, utilize emotion regulation skills, and reduce negative behaviors attached to emotions.           Therapist will administer the PHQ-9 during scheduled session over the next 12 weeks.  ProgressTowards Goals: Progressing  Interventions: Motivational Interviewing and Supportive  Summary: Molly Graves is a 33 y.o. female who presents  with a history of anxiety and depression. She appeared anxious but oriented x5. She reported she went to court last Friday for her daughter's truancy. She was scheduled for another court date. Iqra is hopeful it won't result in any major outcome like jail time but she might have to take some type of classes, which she was interested in parenting classes anyway. She has been approved for IIH with [employer] but they haven't started yet. She plans to call them to check on a start date. She has been talking with the IRS because she still hasn't received her refund. She was advised she needs to verify her identify first. She hopes to handle this task today as well.  Carilyn noted she has let some things go because she doesn't need the extra stress at this time. She continues to focus on school work.   Therapist Response: Conducted session with [name]. Began session with check-in/update since previous session. Utilized empathetic and reflective listening. Used open-ended questions to facilitate discussion and summarized Elfrieda's thoughts/feelings. Scheduled additional appointment and concluded session.   Suicidal/Homicidal: No  Plan: Return again in 4 weeks.  Diagnosis: MDD (major depressive disorder), recurrent episode, moderate (HCC)  GAD (generalized anxiety disorder)  Collaboration of Care: Medication Management AEB chart review  Patient/Guardian was advised Release of Information must be obtained prior to any record release in order to collaborate their care with an outside provider. Patient/Guardian was advised if they have not already done so to contact the registration department to sign all necessary forms in  order for us  to release information regarding their care.   Consent: Patient/Guardian gives verbal consent for treatment and assignment of benefits for services provided during this visit. Patient/Guardian expressed understanding and agreed to proceed.   Len Quale,  [facility] 10/13/2023

## 2023-10-27 ENCOUNTER — Encounter: Payer: Self-pay | Admitting: Psychology

## 2023-10-27 ENCOUNTER — Ambulatory Visit: Admitting: Urology

## 2023-10-27 ENCOUNTER — Ambulatory Visit (INDEPENDENT_AMBULATORY_CARE_PROVIDER_SITE_OTHER): Admitting: Urology

## 2023-10-27 VITALS — BP 125/88 | HR 73 | Ht 63.0 in | Wt 201.5 lb

## 2023-10-27 DIAGNOSIS — N3941 Urge incontinence: Secondary | ICD-10-CM | POA: Diagnosis not present

## 2023-10-27 LAB — URINALYSIS, COMPLETE
Bilirubin, UA: NEGATIVE
Glucose, UA: NEGATIVE
Ketones, UA: NEGATIVE
Leukocytes,UA: NEGATIVE
Nitrite, UA: NEGATIVE
Protein,UA: NEGATIVE
RBC, UA: NEGATIVE
Specific Gravity, UA: 1.015 (ref 1.005–1.030)
Urobilinogen, Ur: 0.2 mg/dL (ref 0.2–1.0)
pH, UA: 7 (ref 5.0–7.5)

## 2023-10-27 LAB — MICROSCOPIC EXAMINATION: Epithelial Cells (non renal): >10 /HPF — AB (ref 0–10)

## 2023-10-27 MED ORDER — MIRABEGRON ER 50 MG PO TB24: 50.0000 mg | ORAL_TABLET | Freq: Every day | ORAL | 11 refills | Status: DC

## 2023-10-27 NOTE — Patient Instructions (Signed)
 Physical Therapy Where: [facility] Address: [address] [place] [address] Phone: [phone]

## 2023-10-27 NOTE — Progress Notes (Signed)
 10/27/2023 11:42 AM   Molly Graves September 04, 1990 [id number]  Referring provider: [facility] [address] [place],  [place] [address]  Chief Complaint  Patient presents with   Urinary Incontinence    HPI: I was consulted to assess the patient's urinary incontinence.  She can have urge incontinence if her bladder is full.  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  Now that she is not working she is pad free.  She does not leak with coughing sneezing.  She might leak with bending lifting her bladder is full but.  No bedwetting.   Can void every 30 to 60 minutes and could hold for 2 hours depending on fluid intake.  Gets up once at night.  Flow was reasonable   Gets 1 bladder infection a year.  She thinks she has had 2 kidney infections.  No hysterectomy   Mild grade 2 hide bili of bladder neck and negative cough test. No prolapse   Patient primarily has mild urge incontinence and significant frequency. Role of physical therapy and medication discussed. Reassess in 6 weeks on Gemtesa  samples and prescription. PT consult sent. If we retrain bladder I will stop medication in the future and this was discussed. Proceed accordingly   Today She said the medication helped her a lot.  She thinks it may affect the strength of her orgasms but she was nonspecific.  She is not certain if it may have caused a few headaches.  She is now taking it off and on but says it works.  Still awaiting to see physical therapy.  Frequency stable.  Clinically not infected        PMH: Past Medical History:  Diagnosis Date   Anemia    Back pain affecting pregnancy in third trimester 11/17/2018   Complication of intrauterine device (IUD) (HCC) 04/19/2016   Depression    post partum depression   Dysuria 12/13/2018   Headache    MIGRAINES   Herpes genitalia    Normal vaginal delivery 12/25/2018   Postpartum care following vaginal delivery 12/25/2018   Pyelonephritis affecting  pregnancy in second trimester 08/08/2018   Supervision of other normal pregnancy, antepartum 05/22/2018   [facility] Prenatal Labs Dating US  Blood type: O/Positive/-- (12/23 1605)  Genetic Screen declines Antibody:Negative (12/23 1605) Anatomic US   incomplete Rubella: 1.86 (12/23 1605) Varicella:   GTT Third trimester:  RPR: Non Reactive (12/23 1605)  Rhogam O+ HBsAg: Negative (12/23 1605)  TDaP vaccine              Flu Shot: HIV: Non Reactive (12/23 1605)  Baby Food                                  Surgical History: Past Surgical History:  Procedure Laterality Date   BREAST SURGERY     lumpectomy left breast   DIAGNOSTIC LAPAROSCOPY  2015   to rule out uterine injury after D&E   DILATION AND EVACUATION N/A 02/10/2020   Procedure: DILATATION AND EVACUATION;  Surgeon: Heron Lord, MD;  Location: [facility] ORS;  Service: Gynecology;  Laterality: N/A;   FEMUR IM NAIL Left 02/18/2019   Procedure: INTRAMEDULLARY (IM) NAIL FEMORAL;  Surgeon: Darrin Emerald, MD;  Location: [facility];  Service: Orthopedics;  Laterality: Left;   HYSTEROSCOPY WITH D & C N/A 04/19/2016   Procedure: DILATATION AND CURETTAGE /HYSTEROSCOPY;  Surgeon: Porfirio Bristol  Payton Both, MD;  Location: [facility] ORS;  Service: Gynecology;  Laterality: N/A;   INDUCED ABORTION  2015   IUD REMOVAL N/A 04/19/2016   Procedure: INTRAUTERINE DEVICE (IUD) REMOVAL;  Surgeon: Alben Alma, MD;  Location: [facility] ORS;  Service: Gynecology;  Laterality: N/A;   LAPAROSCOPY N/A 04/19/2016   Procedure: LAPAROSCOPY DIAGNOSTIC;  Surgeon: Alben Alma, MD;  Location: [facility] ORS;  Service: Gynecology;  Laterality: N/A;    Home Medications:  Allergies as of 10/27/2023   No Known Allergies      Medication List        Accurate as of Oct 27, 2023 11:42 AM. If you have any questions, ask your nurse or doctor.          STOP taking these medications    FLUoxetine  20 MG capsule Commonly known as: PROZAC        TAKE these medications    Gemtesa   75 MG Tabs Generic drug: Vibegron  Take by mouth.   ibuprofen  800 MG tablet Commonly known as: ADVIL  Take 800 mg by mouth every 8 (eight) hours as needed.   norethindrone -ethinyl estradiol-FE 1-20 MG-MCG tablet Commonly known as: Junel FE 1/20 Take 1 tablet by mouth daily.        Allergies: No Known Allergies  Family History: Family History  Problem Relation Age of Onset   Bipolar disorder Mother    Hypertension Mother    Hypertension Maternal Aunt    Cancer Paternal Aunt    Cancer Maternal Uncle    Schizophrenia Maternal Grandfather    Hypertension Maternal Grandmother    ODD Daughter    Autism Son    Cancer Other 29       Cervical, Malignant    Social History:  reports that she has been smoking cigars. She has never used smokeless tobacco. She reports that she does not currently use alcohol after a past usage of about 1.0 standard drink of alcohol per week. She reports that she does not use drugs.  ROS:                                        Physical Exam: BP 125/88   Pulse 73   Ht 5\' 3"  (1.6 m)   Wt 91.4 kg   BMI 35.69 kg/m   Constitutional:  Alert and oriented, No acute distress. HEENT: [place] AT, moist mucus membranes.  Trachea midline, no masses.  Laboratory Data: Lab Results  Component Value Date   WBC 6.6 08/10/2021   HGB 11.7 08/10/2021   HCT 35.7 08/10/2021   MCV 88 08/10/2021   PLT 294 07/21/2021    Lab Results  Component Value Date   CREATININE 0.87 07/21/2021    No results found for: "PSA"  No results found for: "TESTOSTERONE"  No results found for: "HGBA1C"  Urinalysis    Component Value Date/Time   COLORURINE YELLOW 07/21/2021 1001   APPEARANCEUR Clear 08/25/2023 1110   LABSPEC 1.020 07/21/2021 1001   LABSPEC 1.025 06/20/2013 1711   PHURINE 7.0 07/21/2021 1001   GLUCOSEU Negative 08/25/2023 1110   GLUCOSEU Negative 06/20/2013 1711   HGBUR NEGATIVE 07/21/2021 1001   BILIRUBINUR Negative 08/25/2023 1110    BILIRUBINUR Negative 06/20/2013 1711   KETONESUR negative 05/06/2022 1751   KETONESUR 15 (A) 07/21/2021 1001   PROTEINUR Negative 08/25/2023 1110   PROTEINUR TRACE (A) 07/21/2021 1001   UROBILINOGEN 0.2 05/21/2023 1421  NITRITE Negative 08/25/2023 1110   NITRITE NEGATIVE 07/21/2021 1001   LEUKOCYTESUR Negative 08/25/2023 1110   LEUKOCYTESUR NEGATIVE 07/21/2021 1001   LEUKOCYTESUR Negative 06/20/2013 1711    Pertinent Imaging: Urine reviewed  Assessment & Plan: I thought it was reasonable to try Myrbetriq 50 mg 30 x 11.  We can always go back to Gemtesa .  I will ask her about orgasm next time.  Call physical therapy in recent consultation.  Reassess 6 weeks  1. Urgency incontinence (Primary)  - Urinalysis, Complete   No follow-ups on file.  Devorah Fonder, MD  [facility] [address], [address] [place], [place] [address] [phone]

## 2023-10-31 ENCOUNTER — Ambulatory Visit: Payer: Self-pay

## 2023-10-31 LAB — CULTURE, URINE COMPREHENSIVE

## 2023-10-31 MED ORDER — CEPHALEXIN 500 MG PO CAPS
500.0000 mg | ORAL_CAPSULE | Freq: Three times a day (TID) | ORAL | 0 refills | Status: AC
Start: 2023-10-31 — End: 2023-11-07

## 2023-11-07 ENCOUNTER — Telehealth: Payer: Self-pay

## 2023-11-07 NOTE — Telephone Encounter (Signed)
 Patients insurance does not cover myrbetriq .  Patient has tried gemtesa .

## 2023-11-10 ENCOUNTER — Telehealth: Admitting: Physician Assistant

## 2023-11-10 DIAGNOSIS — B379 Candidiasis, unspecified: Secondary | ICD-10-CM | POA: Diagnosis not present

## 2023-11-10 DIAGNOSIS — T3695XA Adverse effect of unspecified systemic antibiotic, initial encounter: Secondary | ICD-10-CM | POA: Diagnosis not present

## 2023-11-10 MED ORDER — FLUCONAZOLE 150 MG PO TABS: 150.0000 mg | ORAL_TABLET | ORAL | 0 refills | Status: DC | PRN

## 2023-11-10 NOTE — Progress Notes (Signed)

## 2023-11-11 MED ORDER — OXYBUTYNIN CHLORIDE ER 10 MG PO TB24: 10.0000 mg | ORAL_TABLET | Freq: Every day | ORAL | 11 refills | Status: DC

## 2023-11-11 NOTE — Telephone Encounter (Signed)
Medication change sent to pharmacy. ?

## 2023-11-12 ENCOUNTER — Ambulatory Visit: Admitting: Professional Counselor

## 2023-11-24 ENCOUNTER — Encounter: Attending: Psychology | Admitting: Psychology

## 2023-11-25 ENCOUNTER — Ambulatory Visit (INDEPENDENT_AMBULATORY_CARE_PROVIDER_SITE_OTHER): Admitting: Professional Counselor

## 2023-11-25 DIAGNOSIS — F411 Generalized anxiety disorder: Secondary | ICD-10-CM | POA: Diagnosis not present

## 2023-11-25 DIAGNOSIS — F331 Major depressive disorder, recurrent, moderate: Secondary | ICD-10-CM

## 2023-11-25 NOTE — Progress Notes (Addendum)
 THERAPIST PROGRESS NOTE  Virtual Visit via Video Note  I connected with Molly Graves on 11/25/23 at  8:00 AM EDT by a video enabled telemedicine application and verified that I am speaking with the correct person using two identifiers.  Location: Patient: Home Provider: Office   I discussed the limitations of evaluation and management by telemedicine and the availability of in person appointments. The patient expressed understanding and agreed to proceed.  I discussed the assessment and treatment plan with the patient. The patient was provided an opportunity to ask questions and all were answered. The patient agreed with the plan and demonstrated an understanding of the instructions.   The patient was advised to call back or seek an in-person evaluation if the symptoms worsen or if the condition fails to improve as anticipated.  I provided 54 minutes of non-face-to-face time during this encounter. Len Quale, [facility]  Session Time: 8:02 AM - 8:56 AM   Participation Level: Active  Behavioral Response: Casual, Alert, Euthymic  Type of Therapy: Individual Therapy  Treatment Goals addressed: Active OP Depression   LTG: "I guess to control emotions and get this anxiety and depression under control. Learn to set healthy boundaries in a non-aggressive way."                 Start:  06/26/23    Expected End:  06/24/24      LTG: Reduce frequency, intensity, and duration of depression symptoms so that daily functioning is improved    STG:"I guess dealing with my emotions. Being able to control that I guess." Improve ability to identify emotions, utilize emotion regulation skills, and reduce negative behaviors attached to emotions.           Therapist will administer the PHQ-9 during scheduled session over the next 12 weeks.  ProgressTowards Goals: Progressing  Interventions: CBT, Motivational Interviewing, Solution Focused, and Supportive and Assertiveness Training  Summary:  Molly Graves is a 33 y.o. female who presents with a history of anxiety and depression. She appeared alert and oriented x5. She stated she received a prayer for judgment in the truancy case for her daughter. She passed 4 out of 5 classes this semester. She finally received her tax refund and was able to get her car fixed. 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 Molly Graves discussed things with her daughter and daughter's father. She also discussed recent interactions with a former partner. She was receptive to [employer] skill to use with him and potential new partner.   Therapist Response: Conducted session with Molly Graves. Began session with check-in/update since previous session. Utilized empathetic and reflective listening. Reviewed STOP skill to assist with reducing emotional reactions. Reviewed DEAR MAN skill to improve objective effectiveness. Discussed attachment styles and how they contribute to relationship issues. Scheduled additional appointment and concluded session.   Suicidal/Homicidal: No  Plan: Return again in 5 weeks.  Diagnosis: MDD (major depressive disorder), recurrent episode, moderate (HCC)  GAD (generalized anxiety disorder)  Collaboration of Care: Medication Management AEB chart review  Patient/Guardian was advised Release of Information must be obtained prior to any record release in order to collaborate their care with an outside provider. Patient/Guardian was advised if they have not already done so to contact the registration department to sign all necessary forms in order for us  to release information regarding their care.   Consent: Patient/Guardian gives verbal consent for treatment and assignment of benefits for services provided during  this visit. Patient/Guardian expressed understanding and agreed to proceed.   Len Quale, [facility] 11/25/2023

## 2024-01-01 ENCOUNTER — Ambulatory Visit: Admitting: Professional Counselor

## 2024-01-08 ENCOUNTER — Telehealth: Admitting: Physician Assistant

## 2024-01-08 DIAGNOSIS — R3989 Other symptoms and signs involving the genitourinary system: Secondary | ICD-10-CM

## 2024-01-08 MED ORDER — CEPHALEXIN 500 MG PO CAPS: 500.0000 mg | ORAL_CAPSULE | Freq: Two times a day (BID) | ORAL | 0 refills | Status: AC

## 2024-01-08 NOTE — Progress Notes (Signed)

## 2024-01-08 NOTE — Progress Notes (Signed)
 I have spent 5 minutes in review of e-visit questionnaire, review and updating patient chart, medical decision making and response to patient.   Piedad Climes, PA-C

## 2024-01-19 ENCOUNTER — Ambulatory Visit: Admitting: Urology

## 2024-01-22 ENCOUNTER — Encounter: Payer: Self-pay | Admitting: Urology

## 2024-02-02 ENCOUNTER — Ambulatory Visit: Admitting: Professional Counselor

## 2024-04-09 ENCOUNTER — Other Ambulatory Visit: Payer: Self-pay | Admitting: Licensed Practical Nurse

## 2024-04-09 ENCOUNTER — Telehealth: Admitting: Emergency Medicine

## 2024-04-09 DIAGNOSIS — Z30011 Encounter for initial prescription of contraceptive pills: Secondary | ICD-10-CM

## 2024-04-09 DIAGNOSIS — R519 Headache, unspecified: Secondary | ICD-10-CM | POA: Diagnosis not present

## 2024-04-09 MED ORDER — RIZATRIPTAN BENZOATE 5 MG PO TABS: 5.0000 mg | ORAL_TABLET | ORAL | 0 refills | Status: DC | PRN

## 2024-04-09 MED ORDER — ONDANSETRON HCL 4 MG PO TABS: 4.0000 mg | ORAL_TABLET | Freq: Three times a day (TID) | ORAL | 0 refills | Status: AC | PRN

## 2024-04-09 NOTE — Progress Notes (Signed)
 Virtual Visit Consent   Molly Graves, you are scheduled for a virtual visit with a [facility] provider today. Just as with appointments in the office, your consent must be obtained to participate. Your consent will be active for this visit and any virtual visit you may have with one of our providers in the next 365 days. If you have a MyChart account, a copy of this consent can be sent to you electronically.  As this is a virtual visit, video technology does not allow for your provider to perform a traditional examination. This may limit your provider's ability to fully assess your condition. If your provider identifies any concerns that need to be evaluated in person or the need to arrange testing (such as labs, EKG, etc.), we will make arrangements to do so. Although advances in technology are sophisticated, we cannot ensure that it will always work on either your end or our end. If the connection with a video visit is poor, the visit may have to be switched to a telephone visit. With either a video or telephone visit, we are not always able to ensure that we have a secure connection.  By engaging in this virtual visit, you consent to the provision of healthcare and authorize for your insurance to be billed (if applicable) for the services provided during this visit. Depending on your insurance coverage, you may receive a charge related to this service.  I need to obtain your verbal consent now. Are you willing to proceed with your visit today? Molly Graves has provided verbal consent on 04/09/2024 for a virtual visit (video or telephone). Jon CHRISTELLA Belt, NP  Date: 04/09/2024 9:14 AM   Virtual Visit via Video Note   I, Jon CHRISTELLA Belt, connected with  Molly Graves  ([id number], 1991-04-28) on 04/09/24 at  9:00 AM EDT by a video-enabled telemedicine application and verified that I am speaking with the correct person using two identifiers.  Location: Patient: Virtual Visit Location Patient:  Home Provider: Virtual Visit Location Provider: Home Office   I discussed the limitations of evaluation and management by telemedicine and the availability of in person appointments. The patient expressed understanding and agreed to proceed.    History of Present Illness: Molly Graves is a 33 y.o. who identifies as a female who was assigned female at birth, and is being seen today for headache.   When taking placebo OCPs, gets migraines. Started 2 years ago. Doesn't happen every month, maybe every other month or every 3 months. Sometimes does not have a period, does not bleed, on the placebo pills. Sometimes she does have periods. Headaches coincide bleeding/periods. If she doesn't get a period, she doesn't  Frontal headache, lights bother her, eyes feel cross-eyed. Sometimes is nauseated with these headaches. Takes ibuprofen - twice a day takes 400mg  to 600mg . Lasts for about 5 days.   1 cup of coffee a day, drinks water or juice rest of day. Drinks herbal teas like chamomile. Sprite or root beer or dr. Nunzio maybe once a day some days.    HPI: HPI  Problems:  Patient Active Problem List   Diagnosis Date Noted   MDD (major depressive disorder), recurrent episode, moderate (HCC) 06/19/2023   GAD (generalized anxiety disorder) 06/19/2023   Long term current use of cannabis 06/19/2023   Bereavement 06/19/2023   History of ADHD 06/19/2023   Retained products of conception following abortion    Incomplete spontaneous abortion    S/P ORIF (open reduction  internal fixation) fracture IM nail left femur 02/18/2019 03/04/2019   Femur fracture (HCC) 02/17/2019   Closed fracture of femur, shaft (HCC) 02/17/2019   MDD (major depressive disorder) 10/10/2018   Severe recurrent major depression without psychotic features (HCC) 10/10/2018   Herpes genitalis 10/02/2018   Iron  deficiency anemia 09/03/2018    Allergies: No Known Allergies Medications:  Current Outpatient Medications:    ondansetron   (ZOFRAN ) 4 MG tablet, Take 1 tablet (4 mg total) by mouth every 8 (eight) hours as needed for nausea or vomiting., Disp: 20 tablet, Rfl: 0   rizatriptan (MAXALT) 5 MG tablet, Take 1 tablet (5 mg total) by mouth as needed for migraine. May repeat in 2 hours if needed, Disp: 10 tablet, Rfl: 0   fluconazole  (DIFLUCAN ) 150 MG tablet, Take 1 tablet (150 mg total) by mouth every 3 (three) days as needed., Disp: 2 tablet, Rfl: 0   ibuprofen  (ADVIL ) 800 MG tablet, Take 800 mg by mouth every 8 (eight) hours as needed., Disp: , Rfl:    mirabegron  ER (MYRBETRIQ ) 50 MG TB24 tablet, Take 1 tablet (50 mg total) by mouth daily., Disp: 30 tablet, Rfl: 11   norethindrone -ethinyl estradiol-FE (JUNEL FE 1/20) 1-20 MG-MCG tablet, TAKE 1 TABLET BY MOUTH EVERY DAY, Disp: 84 tablet, Rfl: 0   oxybutynin  (DITROPAN -XL) 10 MG 24 hr tablet, Take 1 tablet (10 mg total) by mouth daily., Disp: 30 tablet, Rfl: 11  Observations/Objective: Patient is well-developed, well-nourished in no acute distress.  Resting comfortably  at home.  Head is normocephalic, atraumatic.  No labored breathing.  Speech is clear and coherent with logical content.  Patient is alert and oriented at baseline.    Assessment and Plan: 1. Frequent headaches (Primary) - rizatriptan (MAXALT) 5 MG tablet; Take 1 tablet (5 mg total) by mouth as needed for migraine. May repeat in 2 hours if needed  Dispense: 10 tablet; Refill: 0 - ondansetron  (ZOFRAN ) 4 MG tablet; Take 1 tablet (4 mg total) by mouth every 8 (eight) hours as needed for nausea or vomiting.  Dispense: 20 tablet; Refill: 0  She will make f/u appt with gyn to discuss ocp/birth control options.   Follow Up Instructions: I discussed the assessment and treatment plan with the patient. The patient was provided an opportunity to ask questions and all were answered. The patient agreed with the plan and demonstrated an understanding of the instructions.  A copy of instructions were sent to the patient  via MyChart unless otherwise noted below.   The patient was advised to call back or seek an in-person evaluation if the symptoms worsen or if the condition fails to improve as anticipated.    Jon CHRISTELLA Belt, NP

## 2024-04-09 NOTE — Patient Instructions (Signed)
 Molly Graves, thank you for joining Jon CHRISTELLA Belt, NP for today's virtual visit.  While this provider is not your primary care provider (PCP), if your PCP is located in our provider database this encounter information will be shared with them immediately following your visit.   A [facility] MyChart account gives you access to today's visit and all your visits, tests, and labs performed at [facility]  click here if you don't have a [facility] MyChart account or go to [email or web address]  Consent: (Patient) Molly Graves provided verbal consent for this virtual visit at the beginning of the encounter.  Current Medications:  Current Outpatient Medications:    ondansetron  (ZOFRAN ) 4 MG tablet, Take 1 tablet (4 mg total) by mouth every 8 (eight) hours as needed for nausea or vomiting., Disp: 20 tablet, Rfl: 0   rizatriptan (MAXALT) 5 MG tablet, Take 1 tablet (5 mg total) by mouth as needed for migraine. May repeat in 2 hours if needed, Disp: 10 tablet, Rfl: 0   fluconazole  (DIFLUCAN ) 150 MG tablet, Take 1 tablet (150 mg total) by mouth every 3 (three) days as needed., Disp: 2 tablet, Rfl: 0   ibuprofen  (ADVIL ) 800 MG tablet, Take 800 mg by mouth every 8 (eight) hours as needed., Disp: , Rfl:    mirabegron  ER (MYRBETRIQ ) 50 MG TB24 tablet, Take 1 tablet (50 mg total) by mouth daily., Disp: 30 tablet, Rfl: 11   norethindrone -ethinyl estradiol-FE (JUNEL FE 1/20) 1-20 MG-MCG tablet, TAKE 1 TABLET BY MOUTH EVERY DAY, Disp: 84 tablet, Rfl: 0   oxybutynin  (DITROPAN -XL) 10 MG 24 hr tablet, Take 1 tablet (10 mg total) by mouth daily., Disp: 30 tablet, Rfl: 11   Medications ordered in this encounter:  Meds ordered this encounter  Medications   rizatriptan (MAXALT) 5 MG tablet    Sig: Take 1 tablet (5 mg total) by mouth as needed for migraine. May repeat in 2 hours if needed    Dispense:  10 tablet    Refill:  0   ondansetron  (ZOFRAN ) 4 MG tablet    Sig: Take 1 tablet (4 mg  total) by mouth every 8 (eight) hours as needed for nausea or vomiting.    Dispense:  20 tablet    Refill:  0     *If you need refills on other medications prior to your next appointment, please contact your pharmacy*  Follow-Up: Call back or seek an in-person evaluation if the symptoms worsen or if the condition fails to improve as anticipated.  [facility] [phone]  Other Instructions Try the migraine medicine rizatriptan and the nausea medicine to see if they help your headaches. Definitely see your gyn and discuss birth control options with the headaches you are having.    If you have been instructed to have an in-person evaluation today at a local Urgent Care facility, please use the link below. It will take you to a list of all of our available [facility], including address, phone number and hours of operation. Please do not delay care.  [facility]  If you or a family member do not have a primary care provider, use the link below to schedule a visit and establish care. When you choose a [facility] primary care physician or advanced practice provider, you gain a long-term partner in health. Find a Primary Care Provider  Learn more about [facility] in-office and virtual care options: [facility] - Get Care Now

## 2024-04-09 NOTE — Telephone Encounter (Signed)
 Pt will be due for her annual in December.  One refill authorized and pt notified of need to schedule annual exam.

## 2024-04-18 DIAGNOSIS — J069 Acute upper respiratory infection, unspecified: Secondary | ICD-10-CM | POA: Diagnosis not present

## 2024-04-18 DIAGNOSIS — R070 Pain in throat: Secondary | ICD-10-CM | POA: Diagnosis not present

## 2024-04-18 DIAGNOSIS — R059 Cough, unspecified: Secondary | ICD-10-CM | POA: Diagnosis not present

## 2024-04-19 ENCOUNTER — Telehealth: Payer: Self-pay

## 2024-04-19 DIAGNOSIS — F339 Major depressive disorder, recurrent, unspecified: Secondary | ICD-10-CM

## 2024-04-19 NOTE — Progress Notes (Signed)
 Complex Care Management Note Care Guide Note  04/19/2024 Name: Molly Graves MRN: [id number] DOB: Dec 23, 1990   Complex Care Management Outreach Attempts: An unsuccessful telephone outreach was attempted today to offer the patient information about available complex care management services.  Follow Up Plan:  Additional outreach attempts will be made to offer the patient complex care management information and services.   Encounter Outcome:  No Answer  [facility]  [facility], [facility] VBCI Assistant Direct Dial: [phone]  Fax: [phone]

## 2024-04-19 NOTE — Progress Notes (Signed)
 Complex Care Management Note  Care Guide Note 04/19/2024 Name: Molly Graves MRN: [id number] DOB: 06-Oct-1990  Molly Graves is a 34 y.o. year old female who sees [facility] for primary care. I reached out to Brittini S Aaberg by phone today to offer complex care management services.  Molly Graves was given information about Complex Care Management services today including:   The Complex Care Management services include support from the care team which includes your Nurse Care Manager, Clinical Social Worker, or Pharmacist.  The Complex Care Management team is here to help remove barriers to the health concerns and goals most important to you. Complex Care Management services are voluntary, and the patient may decline or stop services at any time by request to their care team member.   Complex Care Management Consent Status: Patient agreed to services and verbal consent obtained.   Follow up plan:  Telephone appointment with complex care management team member scheduled for:  04/27/24 at 11:00 a.m.   Encounter Outcome:  Patient Scheduled  [facility]  [facility], [facility] VBCI Assistant Direct Dial: [phone]  Fax: [phone]

## 2024-04-27 ENCOUNTER — Other Ambulatory Visit: Payer: Self-pay

## 2024-04-27 NOTE — Patient Instructions (Signed)
 Visit Information  Molly Graves was given information about Medicaid Managed Care team care coordination services as a part of their [place] Medicaid benefit.   If you would like to schedule transportation through your [place] plan, please call the following number at least 2 days in advance of your appointment: [phone].   There is no limit to the number of trips during the year between medical appointments, healthcare facilities, or pharmacies. Transportation must be scheduled at least 2 business days before but not more than thirty 30 days before of your appointment.  Call the Behavioral Health Crisis Line at [phone], at any time, 24 hours a day, 7 days a week. If you are in danger or need immediate medical attention call 911.   Please see education materials related to obesity provided by MyChart link.  Care plan and visit instructions communicated with the patient verbally today. Patient agrees to receive a copy in MyChart. Active MyChart status and patient understanding of how to access instructions and care plan via MyChart confirmed with patient.     Social Worker will contact you .  Telephone follow up appointment with Managed Medicaid care management team member scheduled for: 05-18-2024 at 11:00 AM   Hendricks Her RN, BSN  [facility] VBCI-Population Health RN Case Manager   Direct [phone]   Following is a copy of your plan of care:  There are no care plans that you recently modified to display for this patient.

## 2024-05-06 DIAGNOSIS — J069 Acute upper respiratory infection, unspecified: Secondary | ICD-10-CM | POA: Diagnosis not present

## 2024-05-17 NOTE — Patient Outreach (Signed)
 Complex Care Management   Visit Note  05/17/2024  Name:  Molly Graves MRN: [id number] DOB: 1991/03/01  Situation: Referral received for Complex Care Management related to obesity. I obtained verbal consent from Patient.  Visit completed with Patient  on the phone  Background:   Past Medical History:  Diagnosis Date   Anemia    Back pain affecting pregnancy in third trimester 11/17/2018   Complication of intrauterine device (IUD) 04/19/2016   Depression    post partum depression   Dysuria 12/13/2018   Headache    MIGRAINES   Herpes genitalia    Normal vaginal delivery 12/25/2018   Postpartum care following vaginal delivery 12/25/2018   Pyelonephritis affecting pregnancy in second trimester 08/08/2018   Supervision of other normal pregnancy, antepartum 05/22/2018   [facility] Prenatal Labs Dating US  Blood type: O/Positive/-- (12/23 1605)  Genetic Screen declines Antibody:Negative (12/23 1605) Anatomic US   incomplete Rubella: 1.86 (12/23 1605) Varicella:   GTT Third trimester:  RPR: Non Reactive (12/23 1605)  Rhogam O+ HBsAg: Negative (12/23 1605)  TDaP vaccine              Flu Shot: HIV: Non Reactive (12/23 1605)  Baby Food                                  Assessment: Patient Reported Symptoms:  Cognitive Cognitive Status: Alert and oriented to person, place, and time, Normal speech and language skills   Health Maintenance Behaviors: Sleep adequate Healing Pattern: Average Health Facilitated by: Rest  Neurological Neurological Review of Symptoms: Headaches (Migraines) Neurological Management Strategies: Coping strategies, Medication therapy Neurological Self-Management Outcome: 4 (good)  HEENT HEENT Symptoms Reported: Mouth dryness HEENT Management Strategies: Coping strategies HEENT Self-Management Outcome: 4 (good)    Cardiovascular Cardiovascular Symptoms Reported: No symptoms reported Does patient have uncontrolled Hypertension?: No Cardiovascular Management Strategies:  Routine screening Weight: 204 lb (92.5 kg) Cardiovascular Self-Management Outcome: 4 (good)  Respiratory Respiratory Symptoms Reported: No symptoms reported Respiratory Management Strategies: Coping strategies Respiratory Self-Management Outcome: 4 (good)  Endocrine Endocrine Symptoms Reported: Sweating (night sweating) Is patient diabetic?: No Endocrine Self-Management Outcome: 4 (good)  Gastrointestinal Gastrointestinal Symptoms Reported: Flatulence Additional Gastrointestinal Details: even after eating one piece of broccoli ill be gassy the rest of the day  Gastrointestinal Management Strategies: Coping strategies Gastrointestinal Self-Management Outcome: 4 (good)    Genitourinary Genitourinary Symptoms Reported: No symptoms reported Genitourinary Management Strategies: Coping strategies Genitourinary Self-Management Outcome: 4 (good)  Integumentary Additional Integumentary Details: Has frequent UTI's Skin Management Strategies: Coping strategies Skin Self-Management Outcome: 4 (good)  Musculoskeletal Musculoskelatal Symptoms Reviewed: Back pain Additional Musculoskeletal Details: when cleaning house Musculoskeletal Management Strategies: Coping strategies Musculoskeletal Self-Management Outcome: 4 (good) Falls in the past year?: No Number of falls in past year: 1 or less Was there an injury with Fall?: No Fall Risk Category Calculator: 0 Patient Fall Risk Level: Low Fall Risk Patient at Risk for Falls Due to: No Fall Risks Fall risk Follow up: Falls evaluation completed, Education provided, Falls prevention discussed  Psychosocial Psychosocial Symptoms Reported: Depression - if selected complete PHQ 2-9, Anxiety - if selected complete GAD, Sadness - if selected complete PHQ 2-9 Additional Psychological Details: Daughter now at [facility] for self harming - 3rd time this year  Cutting/ pill overdose/ THC Vape that did not agree- EMS transported from school and returned  after incident- became suicidal at school yesterday Behavioral Management Strategies:  Coping strategies, Medication therapy Behavioral Health Self-Management Outcome: 3 (uncertain) Behavioral Health Comment: Hasn't taken her Prozac  in a year; After Uncle passed she was in self destruct mode Major Change/Loss/Stressor/Fears (CP): Death of a loved one [employer], [employer]ing) Behaviors When Feeling Stressed/Fearful: Sleep/ Avoid every single thing- play video games Techniques to [employer] with Loss/Stress/Change: Diversional activities Quality of Family Relationships: helpful, supportive Do you feel physically threatened by others?: No    05/17/2024    PHQ2-9 Depression Screening   Little interest or pleasure in doing things Not at all (Only 1 day)  Feeling down, depressed, or hopeless Nearly every day  PHQ-2 - Total Score 3  Trouble falling or staying asleep, or sleeping too much Not at all  Feeling tired or having little energy Nearly every day  Poor appetite or overeating  Several days  Feeling bad about yourself - or that you are a failure or have let yourself or your family down Nearly every day  Trouble concentrating on things, such as reading the newspaper or watching television Several days  Moving or speaking so slowly that other people could have noticed.  Or the opposite - being so fidgety or restless that you have been moving around a lot more than usual Not at all  Thoughts that you would be better off dead, or hurting yourself in some way Not at all  PHQ2-9 Total Score 11  If you checked off any problems, how difficult have these problems made it for you to do your work, take care of things at home, or get along with other people Very difficult (I Isolate myself)  Depression Interventions/Treatment  (In the process of getting a psychiatrist)    Today's Vitals   04/27/24 1139  Weight: 204 lb (92.5 kg)  Height: 5' 3 (1.6 m)   Pain Scale: 0-10 Pain Score: 0-No pain  Medications Reviewed  Today     Reviewed by Kay Hendricks MATSU, RN (Case Manager) on 04/27/24 at 1127  Med List Status: <None>   Medication Order Taking? Sig Documenting Provider Last Dose Status Informant  fluconazole  (DIFLUCAN ) 150 MG tablet [id number] Yes Take 1 tablet (150 mg total) by mouth every 3 (three) days as needed. Vivienne Nest M, PA-C  Active   ibuprofen  (ADVIL ) 800 MG tablet [id number] Yes Take 800 mg by mouth every 8 (eight) hours as needed. [provider]  Active   mirabegron  ER (MYRBETRIQ ) 50 MG TB24 tablet [id number]  Take 1 tablet (50 mg total) by mouth daily.  Patient not taking: Reported on 04/27/2024   MacDiarmid, Scott, MD  Active   norethindrone -ethinyl estradiol-FE (JUNEL FE 1/20) 1-20 MG-MCG tablet [id number] Yes TAKE 1 TABLET BY MOUTH EVERY DAY Dominic, Jinnie Jansky, CNM  Active   ondansetron  (ZOFRAN ) 4 MG tablet [id number] Yes Take 1 tablet (4 mg total) by mouth every 8 (eight) hours as needed for nausea or vomiting. Richad Jon HERO, NP  Active   oxybutynin  (DITROPAN -XL) 10 MG 24 hr tablet [id number]  Take 1 tablet (10 mg total) by mouth daily.  Patient not taking: Reported on 04/27/2024   MacDiarmid, Scott, MD  Consider Medication Status and Discontinue   rizatriptan  (MAXALT ) 5 MG tablet [id number] Yes Take 1 tablet (5 mg total) by mouth as needed for migraine. May repeat in 2 hours if needed Richad Jon HERO, NP  Active             Recommendation:   Continue Current Plan of Care  Follow Up Plan:   Telephone follow-up  2 weeks   Hendricks Her RN, BSN  [facility] VBCI-Population Health RN Case Information Systems Manager [phone]

## 2024-05-18 ENCOUNTER — Other Ambulatory Visit: Payer: Self-pay

## 2024-05-18 NOTE — Patient Outreach (Signed)
 Complex Care Management   Visit Note  05/18/2024  Name:  Molly Graves MRN: [id number] DOB: 1990-09-25  Situation: Referral received for Complex Care Management related to Mental/Behavioral Health diagnosis anxiety and depression I obtained verbal consent from Patient.  Visit completed with Patient  on the phone. Patient is amenable to working with LCSW to address mental health concerns.  Background:   Past Medical History:  Diagnosis Date   Anemia    Back pain affecting pregnancy in third trimester 11/17/2018   Complication of intrauterine device (IUD) 04/19/2016   Depression    post partum depression   Dysuria 12/13/2018   Headache    MIGRAINES   Herpes genitalia    Normal vaginal delivery 12/25/2018   Postpartum care following vaginal delivery 12/25/2018   Pyelonephritis affecting pregnancy in second trimester 08/08/2018   Supervision of other normal pregnancy, antepartum 05/22/2018   [facility] Prenatal Labs Dating US  Blood type: O/Positive/-- (12/23 1605)  Genetic Screen declines Antibody:Negative (12/23 1605) Anatomic US   incomplete Rubella: 1.86 (12/23 1605) Varicella:   GTT Third trimester:  RPR: Non Reactive (12/23 1605)  Rhogam O+ HBsAg: Negative (12/23 1605)  TDaP vaccine              Flu Shot: HIV: Non Reactive (12/23 1605)  Baby Food                                  Assessment: Patient Reported Symptoms:  Cognitive Cognitive Status: Alert and oriented to person, place, and time, Normal speech and language skills Cognitive/Intellectual Conditions Management [RPT]: None reported or documented in medical history or problem list      Neurological Neurological Review of Symptoms: Not assessed    HEENT HEENT Symptoms Reported: Not assessed      Cardiovascular Cardiovascular Symptoms Reported: Not assessed    Respiratory Respiratory Symptoms Reported: Not assesed    Endocrine Endocrine Symptoms Reported: Not assessed    Gastrointestinal Gastrointestinal Symptoms  Reported: Not assessed      Genitourinary Genitourinary Symptoms Reported: Not assessed    Integumentary Integumentary Symptoms Reported: Not assessed    Musculoskeletal Musculoskelatal Symptoms Reviewed: Not assessed        Psychosocial Psychosocial Symptoms Reported: Anxiety - if selected complete GAD, Depression - if selected complete PHQ 2-9 Additional Psychological Details: Patient reports experiencing more anxiety around her new car and worried about her daughter Behavioral Management Strategies: Coping strategies Behavioral Health Comment: Patient reports having Prozac , patient wants to start back on the medicine Major Change/Loss/Stressor/Fears (CP): Relationship concerns Behaviors When Feeling Stressed/Fearful: Patient shares concerns with her children. Techniques to Cope with Loss/Stress/Change: Diversional activities Quality of Family Relationships: helpful, supportive Do you feel physically threatened by others?: No    05/18/2024    PHQ2-9 Depression Screening   Little interest or pleasure in doing things Several days  Feeling down, depressed, or hopeless Several days  PHQ-2 - Total Score 2  Trouble falling or staying asleep, or sleeping too much Not at all  Feeling tired or having little energy Nearly every day  Poor appetite or overeating  Several days  Feeling bad about yourself - or that you are a failure or have let yourself or your family down More than half the days  Trouble concentrating on things, such as reading the newspaper or watching television Several days  Moving or speaking so slowly that other people could have noticed.  Or the opposite -  being so fidgety or restless that you have been moving around a lot more than usual Not at all  Thoughts that you would be better off dead, or hurting yourself in some way Not at all  PHQ2-9 Total Score 9  If you checked off any problems, how difficult have these problems made it for you to do your work, take care of  things at home, or get along with other people Very difficult  Depression Interventions/Treatment Counseling (Patient is seeking counseling)      05/18/2024    1:08 PM 04/27/2024   11:59 AM 06/19/2023    2:12 PM 06/02/2023    8:26 AM  GAD 7 : Generalized Anxiety Score  Nervous, Anxious, on Edge 2 1    Control/stop worrying 1 0    Worry too much - different things 1 1    Trouble relaxing 0 0    Restless 0 1    Easily annoyed or irritable 2 3    Afraid - awful might happen 2 1    Total GAD 7 Score 8 7    Anxiety Difficulty Somewhat difficult Very difficult       Information is confidential and restricted. Go to Review Flowsheets to unlock data.      There were no vitals filed for this visit.    Medications Reviewed Today     Reviewed by Sherren Olam FORBES KEN (Social Worker) on 05/18/24 at 1306  Med List Status: <None>   Medication Order Taking? Sig Documenting Provider Last Dose Status Informant  fluconazole  (DIFLUCAN ) 150 MG tablet [id number]  Take 1 tablet (150 mg total) by mouth every 3 (three) days as needed.  Patient not taking: Reported on 05/18/2024   Burnette, Jennifer M, PA-C  Active   ibuprofen  (ADVIL ) 800 MG tablet [id number]  Take 800 mg by mouth every 8 (eight) hours as needed. [provider]  Active   mirabegron  ER (MYRBETRIQ ) 50 MG TB24 tablet [id number]  Take 1 tablet (50 mg total) by mouth daily.  Patient not taking: Reported on 05/18/2024   MacDiarmid, Scott, MD  Active   norethindrone -ethinyl estradiol-FE (JUNEL FE 1/20) 1-20 MG-MCG tablet [id number]  TAKE 1 TABLET BY MOUTH EVERY DAY Dominic, Jinnie Jansky, CNM  Active   ondansetron  (ZOFRAN ) 4 MG tablet [id number]  Take 1 tablet (4 mg total) by mouth every 8 (eight) hours as needed for nausea or vomiting. Richad Jon HERO, NP  Active   oxybutynin  (DITROPAN -XL) 10 MG 24 hr tablet [id number]  Take 1 tablet (10 mg total) by mouth daily.  Patient not taking: Reported on 05/18/2024   MacDiarmid, Scott, MD  Active    rizatriptan  (MAXALT ) 5 MG tablet [id number]  Take 1 tablet (5 mg total) by mouth as needed for migraine. May repeat in 2 hours if needed Richad Jon HERO, NP  Active             Recommendation:   Continue Current Plan of Care  Follow Up Plan:   Telephone follow-up 06/01/2024 at 1:00 PM  Olam Sherren, MSW, LCSW [place]  [facility], [facility] Licensed Clinical Social Worker Direct Dial: [phone]

## 2024-05-18 NOTE — Patient Outreach (Signed)
 Complex Care Management   Visit Note  05/18/2024  Name:  Molly Graves MRN: [id number] DOB: 06-Dec-1990  Situation: Referral received for Complex Care Management related to obesity I obtained verbal consent from Patient.  Visit completed with Patient  on the phone  Background:   Past Medical History:  Diagnosis Date   Anemia    Back pain affecting pregnancy in third trimester 11/17/2018   Complication of intrauterine device (IUD) 04/19/2016   Depression    post partum depression   Dysuria 12/13/2018   Headache    MIGRAINES   Herpes genitalia    Normal vaginal delivery 12/25/2018   Postpartum care following vaginal delivery 12/25/2018   Pyelonephritis affecting pregnancy in second trimester 08/08/2018   Supervision of other normal pregnancy, antepartum 05/22/2018   [facility] Prenatal Labs Dating US  Blood type: O/Positive/-- (12/23 1605)  Genetic Screen declines Antibody:Negative (12/23 1605) Anatomic US   incomplete Rubella: 1.86 (12/23 1605) Varicella:   GTT Third trimester:  RPR: Non Reactive (12/23 1605)  Rhogam O+ HBsAg: Negative (12/23 1605)  TDaP vaccine              Flu Shot: HIV: Non Reactive (12/23 1605)  Baby Food                                  Assessment: Patient Reported Symptoms:  Cognitive Cognitive Status: Alert and oriented to person, place, and time, Normal speech and language skills   Health Maintenance Behaviors: Sleep adequate Healing Pattern: Average Health Facilitated by: Rest  Neurological Neurological Review of Symptoms: No symptoms reported Neurological Management Strategies: Coping strategies, Routine screening Neurological Self-Management Outcome: 4 (good)  HEENT   HEENT Management Strategies: Coping strategies HEENT Self-Management Outcome: 4 (good)    Cardiovascular Cardiovascular Symptoms Reported: No symptoms reported Does patient have uncontrolled Hypertension?: No Cardiovascular Management Strategies: Routine screening Weight: 204 lb (92.5  kg) (at Dr office a week or 2  ago) Cardiovascular Self-Management Outcome: 4 (good)  Respiratory Respiratory Symptoms Reported: Productive cough Additional Respiratory Details: Phlegm sometimes light green, got checked twice antibiotics given- cough still there  cut back on smoking Black and Milds 1 per week Respiratory Management Strategies: Coping strategies Respiratory Self-Management Outcome: 4 (good)  Endocrine Endocrine Symptoms Reported: No symptoms reported Is patient diabetic?: No Endocrine Self-Management Outcome: 4 (good)  Gastrointestinal Gastrointestinal Symptoms Reported: Flatulence Additional Gastrointestinal Details: still experiencing gas Gastrointestinal Management Strategies: Coping strategies Gastrointestinal Self-Management Outcome: 4 (good)    Genitourinary Genitourinary Symptoms Reported: Malodorous urine Additional Genitourinary Details: Thinks dehydrated- Now increasing fluids Genitourinary Management Strategies: Coping strategies Genitourinary Self-Management Outcome: 4 (good)  Integumentary Integumentary Symptoms Reported: No symptoms reported Skin Management Strategies: Coping strategies Skin Self-Management Outcome: 4 (good)  Musculoskeletal Musculoskelatal Symptoms Reviewed: No symptoms reported Musculoskeletal Management Strategies: Coping strategies Musculoskeletal Self-Management Outcome: 4 (good) Falls in the past year?: No Number of falls in past year: 1 or less Was there an injury with Fall?: No Fall Risk Category Calculator: 0 Patient Fall Risk Level: Low Fall Risk Patient at Risk for Falls Due to: No Fall Risks Fall risk Follow up: Falls evaluation completed  Psychosocial Psychosocial Symptoms Reported: No symptoms reported Behavioral Management Strategies: Coping strategies Behavioral Health Self-Management Outcome: 4 (good) Major Change/Loss/Stressor/Fears (CP): Denies Quality of Family Relationships: helpful, supportive Do you feel  physically threatened by others?: No    05/18/2024    PHQ2-9 Depression Screening   Little interest or  pleasure in doing things    Feeling down, depressed, or hopeless    PHQ-2 - Total Score    Trouble falling or staying asleep, or sleeping too much    Feeling tired or having little energy    Poor appetite or overeating     Feeling bad about yourself - or that you are a failure or have let yourself or your family down    Trouble concentrating on things, such as reading the newspaper or watching television    Moving or speaking so slowly that other people could have noticed.  Or the opposite - being so fidgety or restless that you have been moving around a lot more than usual    Thoughts that you would be better off dead, or hurting yourself in some way    PHQ2-9 Total Score    If you checked off any problems, how difficult have these problems made it for you to do your work, take care of things at home, or get along with other people    Depression Interventions/Treatment      Today's Vitals   05/18/24 1112  Weight: 204 lb (92.5 kg)   Pain Scale: 0-10  Medications Reviewed Today     Reviewed by Kay Hendricks MATSU, RN (Case Manager) on 05/18/24 at 1108  Med List Status: <None>   Medication Order Taking? Sig Documenting Provider Last Dose Status Informant  fluconazole  (DIFLUCAN ) 150 MG tablet [id number]  Take 1 tablet (150 mg total) by mouth every 3 (three) days as needed.  Patient not taking: Reported on 05/18/2024   Burnette, Jennifer M, PA-C  Active   ibuprofen  (ADVIL ) 800 MG tablet [id number] Yes Take 800 mg by mouth every 8 (eight) hours as needed. [provider]  Active   mirabegron  ER (MYRBETRIQ ) 50 MG TB24 tablet [id number]  Take 1 tablet (50 mg total) by mouth daily.  Patient not taking: Reported on 05/18/2024   MacDiarmid, Scott, MD  Consider Medication Status and Discontinue   norethindrone -ethinyl estradiol-FE (JUNEL FE 1/20) 1-20 MG-MCG tablet [id number] Yes TAKE 1  TABLET BY MOUTH EVERY DAY Dominic, Jinnie Jansky, CNM  Active   ondansetron  (ZOFRAN ) 4 MG tablet [id number] Yes Take 1 tablet (4 mg total) by mouth every 8 (eight) hours as needed for nausea or vomiting. Richad Jon HERO, NP  Active   oxybutynin  (DITROPAN -XL) 10 MG 24 hr tablet [id number]  Take 1 tablet (10 mg total) by mouth daily.  Patient not taking: Reported on 05/18/2024   MacDiarmid, Scott, MD  Consider Medication Status and Discontinue   rizatriptan  (MAXALT ) 5 MG tablet [id number] Yes Take 1 tablet (5 mg total) by mouth as needed for migraine. May repeat in 2 hours if needed Richad Jon HERO, NP  Active             Recommendation:   Continue Current Plan of Care  Follow Up Plan:   Telephone follow-up in 1 month  Hendricks Kay RN, BSN  [place] [facility]Population [facility] RN Case Manager   Direct [phone]

## 2024-05-18 NOTE — Patient Instructions (Signed)
 Visit Information  Ms. Berdan was given information about Medicaid Managed Care team care coordination services as a part of their [place] Medicaid benefit.   If you would like to schedule transportation through your [place] plan, please call the following number at least 2 days in advance of your appointment: [phone].   There is no limit to the number of trips during the year between medical appointments, healthcare facilities, or pharmacies. Transportation must be scheduled at least 2 business days before but not more than thirty 30 days before of your appointment.  Call the Behavioral Health Crisis Line at [phone], at any time, 24 hours a day, 7 days a week. If you are in danger or need immediate medical attention call 911.   Please see education materials related to obesity provided by MyChart link.  Care plan and visit instructions communicated with the patient verbally today. Patient agrees to receive a copy in MyChart. Active MyChart status and patient understanding of how to access instructions and care plan via MyChart confirmed with patient.     Telephone follow up appointment with Managed Medicaid care management team member scheduled for: 06-18-2024 at 9:00 AM   Hendricks Her RN, BSN  [place] [facility]-Population Health RN Case Manager   Direct [phone]   Following is a copy of your plan of care:  There are no care plans that you recently modified to display for this patient.

## 2024-05-18 NOTE — Patient Instructions (Addendum)
 Visit Information  Molly Graves was given information about Medicaid Managed Care team care coordination services as a part of their [place] benefit.   If you would like to schedule transportation through your [place] plan, please call the following number at least 2 days in advance of your appointment: [phone].   There is no limit to the number of trips during the year between medical appointments, healthcare facilities, or pharmacies. Transportation must be scheduled at least 2 business days before but not more than thirty 30 days before of your appointment.  Call the Behavioral Health Crisis Line at [phone], at any time, 24 hours a day, 7 days a week. If you ar in danger or need immediate medical attention call 911.   Please see education materials related to anxiety and depression provided by MyChart link.  Patient verbalizes understanding of instructions and care plan provided today and agrees to view in MyChart. Active MyChart status and patient understanding of how to access instructions and care plan via MyChart confirmed with patient.     Licensed Clinical Social Worker will follow up on 06/01/2024 at 1:00 PM  Molly Graves, MSW, LCSW   [facility], [facility] Licensed Clinical Social Worker Direct Dial: [phone]   Following is a copy of your plan of care:   VBCI Social Work Care Plan: LCSW              Problems:             Disease Management support and education needs related to Anxiety with Excessive Worry, and Depression: loss of energy/fatigue   CSW Clinical Goal(s):             Over the next 90 days the Patient will work with Child Psychotherapist to address concerns related to anxiety and depression through seeking therapy and finding a medication provider.   Interventions:            Mental Health:  Evaluation of current treatment plan related to Anxiety with Excessive Worry, and  Depression: loss of energy/fatigue Active listening / Reflection utilized Behavioral Activation reviewed Discussed referral for psychiatry: Patient is amenable to medication Emotional Support Provided Motivational Interviewing employed LCSW and patient discussed recent events that has attributed to patient's anxiety PHQ2/PHQ9 completed LCSW discussed the results of screening LCSW completed Gad 7 and discussed results Solution-Focued Strategies employed: Suicidal Ideation/Homicidal Ideation assessed: No plan or intent Patient reports wanting therapy and LCSW will provide list LCSW provided patient with 988 crisis line if a need arise LCSW and patient discussed patient's current skills which have been assisting. LCSW has encouraged to explore other coping skills. LCSW provided patient with educational material about anxiety and depression.   Patient Goals/Self-Care Activities:            Coordinate with LCSW  to assist with mental health needs.   Plan:             Telephone follow up appointment with care management team member scheduled for:  06/01/2024 at 1:00 PM.

## 2024-05-20 DIAGNOSIS — H5213 Myopia, bilateral: Secondary | ICD-10-CM | POA: Diagnosis not present

## 2024-05-25 ENCOUNTER — Ambulatory Visit: Admitting: Licensed Practical Nurse

## 2024-06-01 ENCOUNTER — Other Ambulatory Visit: Payer: Self-pay

## 2024-06-01 NOTE — Patient Outreach (Signed)
 Complex Care Management   Visit Note  06/01/2024  Name:  Molly Graves MRN: [id number] DOB: 09/01/1990  Situation: Referral received for Complex Care Management related to Mental/Behavioral Health diagnosis anxiety and depression I obtained verbal consent from Patient.  Visit completed with Patient  on the phone. Patient is amenable to working with LCSW to address mental health concerns. Patient had been provided with mental health agencies however has not had a chance to follow up. The goal is for patient to follow up with a provider by next visit.   Background:   Past Medical History:  Diagnosis Date   Anemia    Back pain affecting pregnancy in third trimester 11/17/2018   Complication of intrauterine device (IUD) 04/19/2016   Depression    post partum depression   Dysuria 12/13/2018   Headache    MIGRAINES   Herpes genitalia    Normal vaginal delivery 12/25/2018   Postpartum care following vaginal delivery 12/25/2018   Pyelonephritis affecting pregnancy in second trimester 08/08/2018   Supervision of other normal pregnancy, antepartum 05/22/2018   [facility] Prenatal Labs Dating US  Blood type: O/Positive/-- (12/23 1605)  Genetic Screen declines Antibody:Negative (12/23 1605) Anatomic US   incomplete Rubella: 1.86 (12/23 1605) Varicella:   GTT Third trimester:  RPR: Non Reactive (12/23 1605)  Rhogam O+ HBsAg: Negative (12/23 1605)  TDaP vaccine              Flu Shot: HIV: Non Reactive (12/23 1605)  Baby Food                                  Assessment: Patient Reported Symptoms:  Cognitive Cognitive Status: Alert and oriented to person, place, and time, Requires Assistance Decision Making Cognitive/Intellectual Conditions Management [RPT]: None reported or documented in medical history or problem list      Neurological Neurological Review of Symptoms: Not assessed    HEENT HEENT Symptoms Reported: Not assessed      Cardiovascular Cardiovascular Symptoms Reported: Not assessed     Respiratory Respiratory Symptoms Reported: Not assesed    Endocrine Endocrine Symptoms Reported: Not assessed    Gastrointestinal Gastrointestinal Symptoms Reported: Not assessed      Genitourinary Genitourinary Symptoms Reported: Not assessed    Integumentary Integumentary Symptoms Reported: Not assessed    Musculoskeletal Musculoskelatal Symptoms Reviewed: Not assessed        Psychosocial Psychosocial Symptoms Reported: Anxiety - if selected complete GAD, Depression - if selected complete PHQ 2-9 Additional Psychological Details: Patient reports that her anxiety is higher due to recent finals, patient reprots stress around family member concerns. Patient reports her depression is about the same. Behavioral Management Strategies: Coping strategies Behavioral Health Comment: Patient reports still wanting to take her prozac . Major Change/Loss/Stressor/Fears (CP): Relationship concerns Behaviors When Feeling Stressed/Fearful: Patient shares concerns with her children Techniques to Cope with Loss/Stress/Change: Diversional activities Quality of Family Relationships: helpful, stressful, supportive Do you feel physically threatened by others?: No    06/01/2024    PHQ2-9 Depression Screening   Little interest or pleasure in doing things    Feeling down, depressed, or hopeless    PHQ-2 - Total Score    Trouble falling or staying asleep, or sleeping too much    Feeling tired or having little energy    Poor appetite or overeating     Feeling bad about yourself - or that you are a failure or have let yourself or  your family down    Trouble concentrating on things, such as reading the newspaper or watching television    Moving or speaking so slowly that other people could have noticed.  Or the opposite - being so fidgety or restless that you have been moving around a lot more than usual    Thoughts that you would be better off dead, or hurting yourself in some way    PHQ2-9 Total Score     If you checked off any problems, how difficult have these problems made it for you to do your work, take care of things at home, or get along with other people    Depression Interventions/Treatment      There were no vitals filed for this visit.    Medications Reviewed Today     Reviewed by Sherren Olam FORBES KEN (Social Worker) on 06/01/24 at 1307  Med List Status: <None>   Medication Order Taking? Sig Documenting Provider Last Dose Status Informant  fluconazole  (DIFLUCAN ) 150 MG tablet [id number]  Take 1 tablet (150 mg total) by mouth every 3 (three) days as needed.  Patient not taking: Reported on 05/18/2024   Burnette, Jennifer M, PA-C  Active   ibuprofen  (ADVIL ) 800 MG tablet [id number]  Take 800 mg by mouth every 8 (eight) hours as needed. [provider]  Active   mirabegron  ER (MYRBETRIQ ) 50 MG TB24 tablet [id number]  Take 1 tablet (50 mg total) by mouth daily.  Patient not taking: Reported on 05/18/2024   MacDiarmid, Scott, MD  Active   norethindrone -ethinyl estradiol-FE (JUNEL FE 1/20) 1-20 MG-MCG tablet [id number]  TAKE 1 TABLET BY MOUTH EVERY DAY Dominic, Lydia Marie, CNM  Active   ondansetron  (ZOFRAN ) 4 MG tablet [id number]  Take 1 tablet (4 mg total) by mouth every 8 (eight) hours as needed for nausea or vomiting. Richad Jon HERO, NP  Active   oxybutynin  (DITROPAN -XL) 10 MG 24 hr tablet [id number]  Take 1 tablet (10 mg total) by mouth daily.  Patient not taking: Reported on 05/18/2024   MacDiarmid, Scott, MD  Active   rizatriptan  (MAXALT ) 5 MG tablet [id number]  Take 1 tablet (5 mg total) by mouth as needed for migraine. May repeat in 2 hours if needed Richad Jon HERO, NP  Active             Recommendation:   Continue Current Plan of Care  Follow Up Plan:   Telephone follow-up 06/15/2024 at 1:00 PM  Olam Sherren, MSW, LCSW [place]  [facility], [facility] Licensed Clinical Social Worker Direct Dial: [phone]

## 2024-06-01 NOTE — Patient Instructions (Signed)
 Visit Information  Thank you for taking time to visit with me today. Please don't hesitate to contact me if I can be of assistance to you before our next scheduled appointment.  Your next care management appointment is by telephone on 06/15/2024 at 1:00 PM   Please call the care guide team at [phone] if you need to cancel, schedule, or reschedule an appointment.   Please call the Suicide and Crisis Lifeline: 988 if you are experiencing a Mental Health or Behavioral Health Crisis or need someone to talk to. Olam Ally, MSW, LCSW [place]  [facility], [facility] Licensed Clinical Social Worker Direct Dial: [phone]

## 2024-06-15 ENCOUNTER — Other Ambulatory Visit: Payer: Self-pay

## 2024-06-15 NOTE — Patient Instructions (Signed)
 Visit Information  Molly Graves - I am sorry I was unable to reach you today for our scheduled appointment. I work with [facility] and am calling to support your healthcare needs. Please contact me at [phone] at your earliest convenience. I look forward to speaking with you soon.    Your next care management appointment is by telephone on 06/15/2024  at 2:00 PM   Please call the care guide team at [phone] if you need to cancel, schedule, or reschedule an appointment.   Please call the Suicide and Crisis Lifeline: 988 if you are experiencing a Mental Health or Behavioral Health Crisis or need someone to talk to. Olam Ally, MSW, LCSW [place]  [facility], [facility] Licensed Clinical Social Worker Direct Dial: [phone]

## 2024-06-15 NOTE — Patient Outreach (Signed)
 LCSW called patient at scheduled time and was unable to reach pt . LCSW left voice mail scheduling another appointment for 06/25/2023 at 2:00 PM.  Olam Ally, MSW, LCSW [place]  [facility], [facility] Licensed Clinical Social Worker Direct Dial: [phone]

## 2024-06-18 ENCOUNTER — Telehealth: Payer: Self-pay

## 2024-06-18 NOTE — Patient Instructions (Signed)
 Tamia S Borjon - I am sorry I was unable to reach you today for our scheduled appointment. I work with [facility] and am calling to support your healthcare needs. Please contact me at [phone] at your earliest convenience. I look forward to speaking with you soon.   Thank you,  Hendricks Her RN, BSN  [place] [facility]Population [facility] RN Case Manager   Direct [phone]

## 2024-06-23 ENCOUNTER — Encounter: Payer: Self-pay | Admitting: Licensed Practical Nurse

## 2024-06-23 ENCOUNTER — Other Ambulatory Visit (HOSPITAL_COMMUNITY)
Admission: RE | Admit: 2024-06-23 | Discharge: 2024-06-23 | Disposition: A | Discharge status: Home / Self Care | Source: Ambulatory Visit | Attending: Licensed Practical Nurse | Admitting: Licensed Practical Nurse

## 2024-06-23 ENCOUNTER — Ambulatory Visit: Admitting: Licensed Practical Nurse

## 2024-06-23 ENCOUNTER — Telehealth: Payer: Self-pay

## 2024-06-23 VITALS — BP 133/92 | HR 102 | Resp 16 | Ht 63.0 in | Wt 207.6 lb

## 2024-06-23 DIAGNOSIS — Z113 Encounter for screening for infections with a predominantly sexual mode of transmission: Secondary | ICD-10-CM

## 2024-06-23 DIAGNOSIS — Z01411 Encounter for gynecological examination (general) (routine) with abnormal findings: Secondary | ICD-10-CM | POA: Diagnosis not present

## 2024-06-23 DIAGNOSIS — Z3009 Encounter for other general counseling and advice on contraception: Secondary | ICD-10-CM

## 2024-06-23 DIAGNOSIS — Z01419 Encounter for gynecological examination (general) (routine) without abnormal findings: Secondary | ICD-10-CM

## 2024-06-23 DIAGNOSIS — Z30011 Encounter for initial prescription of contraceptive pills: Secondary | ICD-10-CM

## 2024-06-23 DIAGNOSIS — R399 Unspecified symptoms and signs involving the genitourinary system: Secondary | ICD-10-CM

## 2024-06-23 DIAGNOSIS — Z13220 Encounter for screening for lipoid disorders: Secondary | ICD-10-CM

## 2024-06-23 DIAGNOSIS — Z124 Encounter for screening for malignant neoplasm of cervix: Secondary | ICD-10-CM | POA: Insufficient documentation

## 2024-06-23 DIAGNOSIS — Z6836 Body mass index (BMI) 36.0-36.9, adult: Secondary | ICD-10-CM

## 2024-06-23 DIAGNOSIS — Z131 Encounter for screening for diabetes mellitus: Secondary | ICD-10-CM

## 2024-06-23 LAB — POCT URINALYSIS DIPSTICK
Bilirubin, UA: NEGATIVE
Glucose, UA: NEGATIVE
Ketones, UA: NEGATIVE
Leukocytes, UA: NEGATIVE
Nitrite, UA: NEGATIVE
Protein, UA: POSITIVE — AB
Spec Grav, UA: 1.015
Urobilinogen, UA: 0.2 U/dL
pH, UA: 6

## 2024-06-23 MED ORDER — NORETHINDRONE 0.35 MG PO TABS: 1.0000 | ORAL_TABLET | Freq: Every day | ORAL | 11 refills | Status: AC

## 2024-06-23 NOTE — Progress Notes (Signed)
 "    Gynecology Annual Exam   PCP: Cletus Glenn  Chief Complaint:  Chief Complaint  Patient presents with   Contraception   migraines    History of Present Illness: Patient is a 34 y.o. H3E6976 presents for annual exam. The patient has no complaints today.  Desires tubal, does not desires another child   Odor in urine always has urgency and frequency,  Leak/dripples urine, was supposed to see Urology but never was abel to make an appointment d/t transportation   Migraines monthly, had virtual appointment with NP   BP high today, denies hx high BP, admits to being under stress, has fam hx HTN   LMP: Patient's last menstrual period was 05/22/2024 (exact date). Average Interval: regular, monthly  does  not always bleed but have PMS symptoms for 7 days  Duration of flow: 7 days Heavy Menses: spotting to moderate, occasional heavy day-full pad within a few hours  Clots: sometimes small ones Intermenstrual Bleeding: maybe, notices brown discharge today  Postcoital Bleeding: no Dysmenorrhea: sometimes, using Motrin  and herbal teas   The patient is sexually active female partner She currently uses OCP (estrogen/progesterone) for contraception. She denies dyspareunia.  The patient does perform self breast exams.  There is no notable family history of breast or ovarian cancer in her family. There may be uterine CA in the family   The patient wears seatbelts: yes.   The patient has regular exercise: started going to the gym, up and down stairs and cleaning.  Currently trying to lose weight, goes to the gym once a week, Avoids high fat carbs and sugar   The patient reports current symptoms of depression.  Was prescribed meds, have not taken in 1 year, feeling kind of down, overwhelmed with caring for 3 kids, her oldest child is going through some mental health issues-has been inpatient, unhappy about her current weight,   Full time student studying medical office admin, graduates  in 2027 Lives with her 3 children, ages 17,8 and 5 Has a partner, feels safe  PCP [facility] Wears glasses,  exam Jun 08 2024  Dental last exam November 2025  Smokes black and milds, trying to quit Moderate alcohol, every 2 weekends fireball, henesy, vodka about 1-2 servings in a sitting  MJ every now and then     Review of Systems: ROS see HPI   Past Medical History:  Patient Active Problem List   Diagnosis Date Noted Date Diagnosed   MDD (major depressive disorder), recurrent episode, moderate (HCC) 06/19/2023    GAD (generalized anxiety disorder) 06/19/2023    Long term current use of cannabis 06/19/2023    Bereavement 06/19/2023    History of ADHD 06/19/2023    Retained products of conception following abortion     Incomplete spontaneous abortion     S/P ORIF (open reduction internal fixation) fracture IM nail left femur 02/18/2019 03/04/2019    Femur fracture (HCC) 02/17/2019    Closed fracture of femur, shaft (HCC) 02/17/2019    MDD (major depressive disorder) 10/10/2018    Severe recurrent major depression without psychotic features (HCC) 10/10/2018    Herpes genitalis 10/02/2018    Iron  deficiency anemia 09/03/2018     Past Surgical History:  Past Surgical History:  Procedure Laterality Date   BREAST SURGERY     lumpectomy left breast   DIAGNOSTIC LAPAROSCOPY  2015   to rule out uterine injury after D&E   DILATION AND EVACUATION N/A 02/10/2020   Procedure: DILATATION AND EVACUATION;  Surgeon: Victor Claudell SAUNDERS, MD;  Location: [facility];  Service: Gynecology;  Laterality: N/A;   FEMUR IM NAIL Left 02/18/2019   Procedure: INTRAMEDULLARY (IM) NAIL FEMORAL;  Surgeon: Margrette Taft BRAVO, MD;  Location: [facility];  Service: Orthopedics;  Laterality: Left;   HYSTEROSCOPY WITH D & C N/A 04/19/2016   Procedure: DILATATION AND CURETTAGE /HYSTEROSCOPY;  Surgeon: Lamar SHAUNNA Lesches, MD;  Location: [facility];  Service: Gynecology;  Laterality: N/A;   INDUCED ABORTION  2015   IUD REMOVAL N/A  04/19/2016   Procedure: INTRAUTERINE DEVICE (IUD) REMOVAL;  Surgeon: Lamar SHAUNNA Lesches, MD;  Location: [facility];  Service: Gynecology;  Laterality: N/A;   LAPAROSCOPY N/A 04/19/2016   Procedure: LAPAROSCOPY DIAGNOSTIC;  Surgeon: Lamar SHAUNNA Lesches, MD;  Location: [facility];  Service: Gynecology;  Laterality: N/A;    Gynecologic History:  Patient's last menstrual period was 05/22/2024 (exact date). Contraception: OCP (estrogen/progesterone) Last Pap: Results were: ASCUS with NEGATIVE high risk HPV 2023  Obstetric History: [id number]  Family History:  Family History  Problem Relation Age of Onset   Bipolar disorder Mother    Hypertension Mother    Hypertension Maternal Aunt    Cancer Paternal Aunt    Cancer Maternal Uncle    Schizophrenia Maternal Grandfather    Hypertension Maternal Grandmother    ODD Daughter    Autism Son    Cancer Other 29       Cervical, Malignant    Social History:  Social History   Socioeconomic History   Marital status: Single    Spouse name: Not on file   Number of children: 3   Years of education: Not on file   Highest education level: Some college, no degree  Occupational History   Not on file  Tobacco Use   Smoking status: Every Day    Types: Cigars    Last attempt to quit: 04/17/2018    Years since quitting: 6.1   Smokeless tobacco: Never  Vaping Use   Vaping status: Former   Quit date: 06/17/2016   Substances: CBD  Substance and Sexual Activity   Alcohol use: Not Currently    Alcohol/week: 1.0 standard drink of alcohol    Types: 1 Glasses of wine per week    Comment: occ   Drug use: No   Sexual activity: Not Currently    Partners: Male  Other Topics Concern   Not on file  Social History Narrative   Not on file   Social Drivers of Health   Tobacco Use: High Risk (06/23/2024)   Patient History    Smoking Tobacco Use: Every Day    Smokeless Tobacco Use: Never    Passive Exposure: Not on file  Financial Resource Strain: High Risk  (06/02/2023)   Overall Financial Resource Strain (CARDIA)    Difficulty of Paying Living Expenses: Very hard  Food Insecurity: Food Insecurity Present (05/18/2024)   Epic    Worried About Programme Researcher, Broadcasting/film/video in the Last Year: Never true    Ran Out of Food in the Last Year: Sometimes true  Transportation Needs: Unmet Transportation Needs (05/18/2024)   Epic    Lack of Transportation (Medical): No    Lack of Transportation (Non-Medical): Yes  Physical Activity: Inactive (06/02/2023)   Exercise Vital Sign    Days of Exercise per Week: 0 days    Minutes of Exercise per Session: 0 min  Stress: Stress Concern Present (06/02/2023)   [employer] of Occupational Health - Occupational Stress Questionnaire  Feeling of Stress : Rather much  Social Connections: Moderately Integrated (06/02/2023)   Social Connection and Isolation Panel    Frequency of Communication with Friends and Family: More than three times a week    Frequency of Social Gatherings with Friends and Family: More than three times a week    Attends Religious Services: 1 to 4 times per year    Active Member of Clubs or Organizations: Yes    Attends Banker Meetings: Never    Marital Status: Never married  Intimate Partner Violence: Not At Risk (05/18/2024)   Epic    Fear of Current or Ex-Partner: No    Emotionally Abused: No    Physically Abused: No    Sexually Abused: No  Depression (PHQ2-9): Medium Risk (05/18/2024)   Depression (PHQ2-9)    PHQ-2 Score: 9  Alcohol Screen: Low Risk (06/02/2023)   Alcohol Screen    Last Alcohol Screening Score (AUDIT): 2  Housing: Low Risk (05/18/2024)   Epic    Unable to Pay for Housing in the Last Year: No    Number of Times Moved in the Last Year: 1    Homeless in the Last Year: No  Utilities: Not At Risk (05/18/2024)   Epic    Threatened with loss of utilities: No  Recent Concern: Utilities - At Risk (04/27/2024)   Epic    Threatened with loss of utilities: Yes   Health Literacy: Adequate Health Literacy (06/02/2023)   B1300 Health Literacy    Frequency of need for help with medical instructions: Never    Allergies:  Allergies[1]  Medications: Prior to Admission medications  Medication Sig Start Date End Date Taking? Authorizing Provider  ibuprofen  (ADVIL ) 800 MG tablet Take 800 mg by mouth every 8 (eight) hours as needed. 04/10/22  Yes [provider]  norethindrone -ethinyl estradiol-FE (JUNEL FE 1/20) 1-20 MG-MCG tablet TAKE 1 TABLET BY MOUTH EVERY DAY 04/09/24  Yes Reinhardt Licausi, Jinnie Jansky, CNM  ondansetron  (ZOFRAN ) 4 MG tablet Take 1 tablet (4 mg total) by mouth every 8 (eight) hours as needed for nausea or vomiting. 04/09/24  Yes Richad Jon HERO, NP  rizatriptan  (MAXALT ) 5 MG tablet Take 1 tablet (5 mg total) by mouth as needed for migraine. May repeat in 2 hours if needed 04/09/24  Yes Kabbe, Jon HERO, NP    Physical Exam Vitals: Blood pressure (!) 133/92, pulse (!) 102, resp. rate 16, height 5' 3 (1.6 m), weight 207 lb 9.6 oz (94.2 kg), last menstrual period 05/22/2024.  General: NAD HEENT: normocephalic, anicteric Thyroid : no enlargement, no palpable nodules Pulmonary: No increased work of breathing, CTAB Cardiovascular: RRR, distal pulses 2+ Breast: Breast symmetrical, no tenderness, no palpable nodules or masses, no skin or nipple retraction present, no nipple discharge.  No axillary or supraclavicular lymphadenopathy. Abdomen: NABS, soft, non-tender, non-distended.  Umbilicus without lesions.  No hepatomegaly, splenomegaly or masses palpable. No evidence of hernia  Genitourinary:  External: Normal external female genitalia.  Normal urethral meatus, normal Bartholin's and Skene's glands.    Vagina: Normal vaginal mucosa, no evidence of prolapse.  Some tone   Cervix: Grossly normal in appearance, no bleeding  Uterus: Non-enlarged, mobile, normal contour.  No CMT  Adnexa: ovaries non-enlarged, no adnexal masses  Rectal:  deferred  Lymphatic: no evidence of inguinal lymphadenopathy Extremities: no edema, erythema, or tenderness Neurologic: Grossly intact Psychiatric: mood appropriate, affect full   Assessment: 34 y.o. H3E6976 routine annual exam  Plan: Problem List Items Addressed This Visit  None Visit Diagnoses       BMI 36.0-36.9,adult    -  Primary   Relevant Orders   Ambulatory referral to Family Practice     Well woman exam       Relevant Orders   CBC   Comprehensive metabolic panel with GFR   Hemoglobin A1c   Lipid panel   HEP, RPR, HIV Panel   TSH + free T4   Cytology - PAP     Screening examination for venereal disease       Relevant Orders   HEP, RPR, HIV Panel     Screening for diabetes mellitus (DM)       Relevant Orders   Hemoglobin A1c     Screening for cholesterol level       Relevant Orders   Lipid panel     Cervical cancer screening       Relevant Orders   HEP, RPR, HIV Panel   Cytology - PAP     UTI symptoms       Relevant Orders   POCT Urinalysis Dipstick (Completed)   Urine Culture     Sterilization consult       Relevant Orders   Ambulatory referral to Obstetrics / Gynecology     Encounter for initial prescription of contraceptive pills       Relevant Medications   norethindrone  (MICRONOR ) 0.35 MG tablet       2) STI screening  wasoffered and accepted  2)  ASCCP guidelines and rational discussed.  Patient opts for every 5 years screening interval  3) Contraception - the patient is currently using  OCP (estrogen/progesterone).  Will switch to POP given BMI, tobacco use, Migraine with aura and today's BP elevated.  -referral for tubal ligation placed   4) Routine healthcare maintenance including cholesterol, diabetes screening discussed Ordered today  5) No follow-ups on file.  Jinnie Cookey, CNM  [facility] 06/23/2024, 5:25 PM       [1] No Known Allergies  "

## 2024-06-23 NOTE — Patient Instructions (Signed)
 Molly Graves - I have attempted to call you three times but have been unsuccessful in reaching you. I work with [facility] and am calling to support your healthcare needs. If I can be of assistance to you, please contact me at [phone].     Thank you,  Hendricks Her RN, BSN  [place] [facility] RN Case Manager   Direct [phone]

## 2024-06-24 ENCOUNTER — Other Ambulatory Visit: Payer: Self-pay

## 2024-06-24 LAB — COMPREHENSIVE METABOLIC PANEL WITH GFR
ALT: 14 IU/L (ref 0–32)
AST: 17 IU/L (ref 0–40)
Albumin: 4.2 g/dL (ref 3.9–4.9)
Alkaline Phosphatase: 95 IU/L (ref 41–116)
BUN/Creatinine Ratio: 13 (ref 9–23)
BUN: 12 mg/dL (ref 6–20)
Bilirubin Total: 0.3 mg/dL (ref 0.0–1.2)
CO2: 17 mmol/L — ABNORMAL LOW (ref 20–29)
Calcium: 9.7 mg/dL (ref 8.7–10.2)
Chloride: 109 mmol/L — ABNORMAL HIGH (ref 96–106)
Creatinine, Ser: 0.89 mg/dL (ref 0.57–1.00)
Globulin, Total: 2.9 g/dL (ref 1.5–4.5)
Glucose: 93 mg/dL (ref 70–99)
Potassium: 4.3 mmol/L (ref 3.5–5.2)
Sodium: 139 mmol/L (ref 134–144)
Total Protein: 7.1 g/dL (ref 6.0–8.5)
eGFR: 88 mL/min/1.73

## 2024-06-24 LAB — CBC
Hematocrit: 41.7 % (ref 34.0–46.6)
Hemoglobin: 14.1 g/dL (ref 11.1–15.9)
MCH: 29.7 pg (ref 26.6–33.0)
MCHC: 33.8 g/dL (ref 31.5–35.7)
MCV: 88 fL (ref 79–97)
Platelets: 352 x10E3/uL (ref 150–450)
RBC: 4.74 x10E6/uL (ref 3.77–5.28)
RDW: 12.7 % (ref 11.7–15.4)
WBC: 8 x10E3/uL (ref 3.4–10.8)

## 2024-06-24 LAB — HEP, RPR, HIV PANEL
HIV Screen 4th Generation wRfx: NONREACTIVE
Hepatitis B Surface Ag: NEGATIVE
RPR Ser Ql: NONREACTIVE

## 2024-06-24 LAB — LIPID PANEL
Chol/HDL Ratio: 3.5 ratio (ref 0.0–4.4)
Cholesterol, Total: 195 mg/dL (ref 100–199)
HDL: 55 mg/dL
LDL Chol Calc (NIH): 125 mg/dL — ABNORMAL HIGH (ref 0–99)
Triglycerides: 82 mg/dL (ref 0–149)
VLDL Cholesterol Cal: 15 mg/dL (ref 5–40)

## 2024-06-24 LAB — HEMOGLOBIN A1C
Est. average glucose Bld gHb Est-mCnc: 120 mg/dL
Hgb A1c MFr Bld: 5.8 % — ABNORMAL HIGH (ref 4.8–5.6)

## 2024-06-24 LAB — TSH+FREE T4
Free T4: 1.02 ng/dL (ref 0.82–1.77)
TSH: 1.89 u[IU]/mL (ref 0.450–4.500)

## 2024-06-24 NOTE — Patient Outreach (Signed)
 Complex Care Management   Visit Note  06/24/2024  Name:  Molly Graves MRN: [id number] DOB: 1991/02/15  Situation: Referral received for Complex Care Management related to Mental/Behavioral Health diagnosis anxiety and depression I obtained verbal consent from Patient.  Visit completed with Patient  on the phone. Patient is amenable to working with LCSW to address mental health concerns. Patient has decided to utilize [facility] for mental health treatment and the plan is to have appointment by next outreach.  Background:   Past Medical History:  Diagnosis Date   Anemia    Back pain affecting pregnancy in third trimester 11/17/2018   Complication of intrauterine device (IUD) 04/19/2016   Depression    post partum depression   Dysuria 12/13/2018   Headache    MIGRAINES   Herpes genitalia    Normal vaginal delivery 12/25/2018   Postpartum care following vaginal delivery 12/25/2018   Pyelonephritis affecting pregnancy in second trimester 08/08/2018   Supervision of other normal pregnancy, antepartum 05/22/2018   [facility] Prenatal Labs Dating US  Blood type: O/Positive/-- (12/23 1605)  Genetic Screen declines Antibody:Negative (12/23 1605) Anatomic US   incomplete Rubella: 1.86 (12/23 1605) Varicella:   GTT Third trimester:  RPR: Non Reactive (12/23 1605)  Rhogam O+ HBsAg: Negative (12/23 1605)  TDaP vaccine              Flu Shot: HIV: Non Reactive (12/23 1605)  Baby Food                                  Assessment: Patient Reported Symptoms:  Cognitive Cognitive Status: Normal speech and language skills, Alert and oriented to person, place, and time Cognitive/Intellectual Conditions Management [RPT]: None reported or documented in medical history or problem list      Neurological Neurological Review of Symptoms: No symptoms reported    HEENT HEENT Symptoms Reported: No symptoms reported      Cardiovascular Cardiovascular Symptoms Reported: Chest pain or discomfort Does patient have  uncontrolled Hypertension?: No Cardiovascular Management Strategies: Adequate rest  Respiratory Respiratory Symptoms Reported: No symptoms reported Other Respiratory Symptoms: Patient reports having respiratory issues due to smoking    Endocrine Endocrine Symptoms Reported: No symptoms reported Is patient diabetic?: No    Gastrointestinal Gastrointestinal Symptoms Reported: Cramping Additional Gastrointestinal Details: Due to menstrual cycle Gastrointestinal Management Strategies: Adequate rest    Genitourinary Genitourinary Symptoms Reported: Urgency Additional Genitourinary Details: Patient reports prolapse bladder Genitourinary Management Strategies: Adequate rest  Integumentary Integumentary Symptoms Reported: No symptoms reported    Musculoskeletal Musculoskelatal Symptoms Reviewed: No symptoms reported   Falls in the past year?: No Number of falls in past year: 1 or less Was there an injury with Fall?: No Fall Risk Category Calculator: 0 Patient Fall Risk Level: Low Fall Risk Patient at Risk for Falls Due to: No Fall Risks Fall risk Follow up: Falls evaluation completed  Psychosocial Psychosocial Symptoms Reported: Anxiety - if selected complete GAD, Depression - if selected complete PHQ 2-9 Additional Psychological Details: Patient reports feeling stressed about school however received good needs Behavioral Management Strategies: Coping strategies (Patient is amenable to therapy) Major Change/Loss/Stressor/Fears (CP): Relationship concerns, Resources Techniques to [employer] with Loss/Stress/Change:  (Patient is agreeable to therapy) Quality of Family Relationships: involved, stressful, helpful Do you feel physically threatened by others?: No    06/24/2024    PHQ2-9 Depression Screening   Little interest or pleasure in doing things Several days  Feeling  down, depressed, or hopeless Several days  PHQ-2 - Total Score 2  Trouble falling or staying asleep, or sleeping too much  Several days  Feeling tired or having little energy Nearly every day  Poor appetite or overeating  More than half the days  Feeling bad about yourself - or that you are a failure or have let yourself or your family down More than half the days  Trouble concentrating on things, such as reading the newspaper or watching television Several days  Moving or speaking so slowly that other people could have noticed.  Or the opposite - being so fidgety or restless that you have been moving around a lot more than usual Not at all  Thoughts that you would be better off dead, or hurting yourself in some way Not at all  PHQ2-9 Total Score 11  If you checked off any problems, how difficult have these problems made it for you to do your work, take care of things at home, or get along with other people Somewhat difficult  Depression Interventions/Treatment         06/24/2024    2:30 PM 05/18/2024    1:08 PM 04/27/2024   11:59 AM 06/19/2023    2:12 PM  GAD 7 : Generalized Anxiety Score  Nervous, Anxious, on Edge 2 2 1    Control/stop worrying 1 1 0   Worry too much - different things 1 1 1    Trouble relaxing 0 0 0   Restless 0 0 1   Easily annoyed or irritable 2 2 3    Afraid - awful might happen 0 2 1   Total GAD 7 Score 6 8 7    Anxiety Difficulty  Somewhat difficult Very difficult      Information is confidential and restricted. Go to Review Flowsheets to unlock data.      There were no vitals filed for this visit. Pain Score: 0-No pain  Medications Reviewed Today     Reviewed by Sherren Olam BRAVO, LCSW (Social Worker) on 06/24/24 at 1418  Med List Status: <None>   Medication Order Taking? Sig Documenting Provider Last Dose Status Informant  ibuprofen  (ADVIL ) 800 MG tablet [id number] Yes Take 800 mg by mouth every 8 (eight) hours as needed. [provider]  Active   norethindrone  (MICRONOR ) 0.35 MG tablet [id number]  Take 1 tablet (0.35 mg total) by mouth daily. Delinda Jinnie Jansky, CNM   Active   ondansetron  (ZOFRAN ) 4 MG tablet [id number] Yes Take 1 tablet (4 mg total) by mouth every 8 (eight) hours as needed for nausea or vomiting. Richad Jon HERO, NP  Active   rizatriptan  (MAXALT ) 5 MG tablet [id number] Yes Take 1 tablet (5 mg total) by mouth as needed for migraine. May repeat in 2 hours if needed Richad Jon HERO, NP  Active             Recommendation:   Continue Current Plan of Care  Follow Up Plan:   Telephone follow-up 07/08/2024 at 2:00 pm with LCSW and 06/30/2024 at 10::00 am with RN Macario Olam Sherren, MSW, LCSW [address]  [facility], [facility] Licensed Clinical Social Worker Direct Dial: [phone]

## 2024-06-25 LAB — URINE CULTURE

## 2024-06-26 ENCOUNTER — Ambulatory Visit: Payer: Self-pay | Admitting: Licensed Practical Nurse

## 2024-06-28 LAB — CYTOLOGY - PAP
Chlamydia: NEGATIVE
Comment: NEGATIVE
Comment: NEGATIVE
Comment: NORMAL
Diagnosis: UNDETERMINED — AB
High risk HPV: NEGATIVE
Neisseria Gonorrhea: NEGATIVE

## 2024-06-30 ENCOUNTER — Other Ambulatory Visit: Payer: Self-pay

## 2024-06-30 NOTE — Patient Outreach (Signed)
 Complex Care Management   Visit Note  06/30/2024  Name:  Molly Graves MRN: [id number] DOB: 07-29-1990  Situation: Referral received for Complex Care Management related to obesity and elevated A1C I obtained verbal consent from Patient.  Visit completed with Patient  on the phone  Background:   Past Medical History:  Diagnosis Date   Anemia    Back pain affecting pregnancy in third trimester 11/17/2018   Complication of intrauterine device (IUD) 04/19/2016   Depression    post partum depression   Dysuria 12/13/2018   Headache    MIGRAINES   Herpes genitalia    Normal vaginal delivery 12/25/2018   Postpartum care following vaginal delivery 12/25/2018   Pyelonephritis affecting pregnancy in second trimester 08/08/2018   Supervision of other normal pregnancy, antepartum 05/22/2018   Clinic [facility] Prenatal Labs Dating US  Blood type: O/Positive/-- (12/23 1605)  Genetic Screen declines Antibody:Negative (12/23 1605) Anatomic US   incomplete Rubella: 1.86 (12/23 1605) Varicella:   GTT Third trimester:  RPR: Non Reactive (12/23 1605)  Rhogam O+ HBsAg: Negative (12/23 1605)  TDaP vaccine              Flu Shot: HIV: Non Reactive (12/23 1605)  Baby Food                                  Assessment: Patient Reported Symptoms:  Cognitive Cognitive Status: Normal speech and language skills Cognitive/Intellectual Conditions Management [RPT]: None reported or documented in medical history or problem list   Health Maintenance Behaviors: Annual physical exam Healing Pattern: Average Health Facilitated by: Rest  Neurological Neurological Review of Symptoms: No symptoms reported Neurological Management Strategies: Exercise, Diet modification, Coping strategies, Adequate rest Neurological Self-Management Outcome: 4 (good)  HEENT HEENT Symptoms Reported: No symptoms reported HEENT Management Strategies: Adequate rest, Coping strategies, Routine screening HEENT Self-Management Outcome: 4 (good)     Cardiovascular Cardiovascular Symptoms Reported: No symptoms reported Does patient have uncontrolled Hypertension?: No Cardiovascular Management Strategies: Adequate rest, Coping strategies, Routine screening Weight: 208 lb (94.3 kg) Cardiovascular Self-Management Outcome: 4 (good)  Respiratory Respiratory Symptoms Reported: Dry cough Additional Respiratory Details: only when I am laying down Respiratory Management Strategies: Adequate rest, Routine screening Respiratory Self-Management Outcome: 4 (good)  Endocrine Endocrine Symptoms Reported: No symptoms reported Is patient diabetic?: No (LAst A1C 5.8) Endocrine Self-Management Outcome: 3 (uncertain) Endocrine Comment: Just recently had elevated A1C  Gastrointestinal Gastrointestinal Symptoms Reported: Cramping, Bleeding Additional Gastrointestinal Details: Due to mentrual cycle Gastrointestinal Management Strategies: Adequate rest Gastrointestinal Self-Management Outcome: 4 (good)    Genitourinary Genitourinary Symptoms Reported: Frequency Additional Genitourinary Details: since cycle started Genitourinary Management Strategies: Adequate rest Genitourinary Self-Management Outcome: 4 (good) Genitourinary Comment: States Dr asked patient to let he know how many times a day she is urinating  Integumentary Integumentary Symptoms Reported: Sweating Additional Integumentary Details: night sweats Skin Management Strategies: Adequate rest, Coping strategies, Routine screening Skin Self-Management Outcome: 4 (good)  Musculoskeletal Musculoskelatal Symptoms Reviewed: Weakness Additional Musculoskeletal Details: LEft leg weakness Rod in femur Musculoskeletal Management Strategies: Coping strategies, Adequate rest Musculoskeletal Self-Management Outcome: 4 (good) Falls in the past year?: No Number of falls in past year: 1 or less Was there an injury with Fall?: No Fall Risk Category Calculator: 0 Patient Fall Risk Level: Low Fall  Risk Patient at Risk for Falls Due to: No Fall Risks Fall risk Follow up: Falls evaluation completed  Psychosocial Psychosocial Symptoms Reported: Depression - if  selected complete PHQ 2-9 Behavioral Management Strategies: Coping strategies Behavioral Health Self-Management Outcome: 4 (good) Behavioral Health Comment: Due to intensive care therapy with [facility]  Come to the home daily (34 y/o ) Daughter ODD/ Anxiety and Depression Major Change/Loss/Stressor/Fears (CP): Relationship concerns, Resources Behaviors When Feeling Stressed/Fearful: 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 / smokes black and mild Techniques to [place] with Loss/Stress/Change: None Quality of Family Relationships: non-existent Do you feel physically threatened by others?: No    06/30/2024    PHQ2-9 Depression Screening   Little interest or pleasure in doing things Several days  Feeling down, depressed, or hopeless Several days  PHQ-2 - Total Score 2  Trouble falling or staying asleep, or sleeping too much Several days  Feeling tired or having little energy Nearly every day  Poor appetite or overeating  Several days  Feeling bad about yourself - or that you are a failure or have let yourself or your family down Several days  Trouble concentrating on things, such as reading the newspaper or watching television Several days  Moving or speaking so slowly that other people could have noticed.  Or the opposite - being so fidgety or restless that you have been moving around a lot more than usual Not at all  Thoughts that you would be better off dead, or hurting yourself in some way Not at all  PHQ2-9 Total Score 9  If you checked off any problems, how difficult have these problems made it for you to do your work, take care of things at home, or get along with other people Very difficult  Depression Interventions/Treatment Counseling (Patient seeking counseling from list that LCSW shared  Wants to use [facility]. Needs to  go do Intake)    Today's Vitals   06/30/24 1018  Weight: 208 lb (94.3 kg)  Height: 5' 3 (1.6 m)   Pain Scale: 0-10 Pain Score: 0-No pain  Medications Reviewed Today     Reviewed by Kay Hendricks MATSU, RN (Case Manager) on 06/30/24 at 1010  Med List Status: <None>   Medication Order Taking? Sig Documenting Provider Last Dose Status Informant  ibuprofen  (ADVIL ) 800 MG tablet [id number] Yes Take 800 mg by mouth every 8 (eight) hours as needed. [provider]  Active   norethindrone  (MICRONOR ) 0.35 MG tablet [id number] Yes Take 1 tablet (0.35 mg total) by mouth daily. Delinda Jinnie Jansky, CNM  Active   ondansetron  (ZOFRAN ) 4 MG tablet [id number] Yes Take 1 tablet (4 mg total) by mouth every 8 (eight) hours as needed for nausea or vomiting. Richad Jon HERO, NP  Active   rizatriptan  (MAXALT ) 5 MG tablet [id number] Yes Take 1 tablet (5 mg total) by mouth as needed for migraine. May repeat in 2 hours if needed Richad Jon HERO, NP  Active             Recommendation:   Continue Current Plan of Care  Follow Up Plan:   Telephone follow-up in 1 month  Hendricks Kay RN, BSN  [place] [facility] RN Case Manager   Direct [phone]

## 2024-07-08 ENCOUNTER — Other Ambulatory Visit: Payer: Self-pay

## 2024-07-08 NOTE — Patient Instructions (Signed)
 Visit Information  Thank you for taking time to visit with me today. Please don't hesitate to contact me if I can be of assistance to you before our next scheduled appointment.  Your next care management appointment is by telephone on 07/23/2024 at 2:00 PM   Please call the care guide team at [phone] if you need to cancel, schedule, or reschedule an appointment.   Please call the Suicide and Crisis Lifeline: 988 if you are experiencing a Mental Health or Behavioral Health Crisis or need someone to talk to.  Olam Ally, MSW, LCSW [place]  [facility], [facility] Licensed Clinical Social Worker Direct Dial: [phone]

## 2024-07-08 NOTE — Patient Outreach (Signed)
 Complex Care Management   Visit Note  07/08/2024  Name:  Molly Graves MRN: [id number] DOB: 09-Aug-1990  Situation: Referral received for Complex Care Management related to Mental/Behavioral Health diagnosis anxiety and depression I obtained verbal consent from Patient.  Visit completed with Patient  on the phone. Patient is amenable to working with LCSW to address mental health concerns. Patient informed LCSW the plan is for patient to utilize [facility] for mental health treatment and the plan is to have appointment by next outreach.   Background:   Past Medical History:  Diagnosis Date   Anemia    Back pain affecting pregnancy in third trimester 11/17/2018   Complication of intrauterine device (IUD) 04/19/2016   Depression    post partum depression   Dysuria 12/13/2018   Headache    MIGRAINES   Herpes genitalia    Normal vaginal delivery 12/25/2018   Postpartum care following vaginal delivery 12/25/2018   Pyelonephritis affecting pregnancy in second trimester 08/08/2018   Supervision of other normal pregnancy, antepartum 05/22/2018   Clinic [facility] Prenatal Labs Dating US  Blood type: O/Positive/-- (12/23 1605)  Genetic Screen declines Antibody:Negative (12/23 1605) Anatomic US   incomplete Rubella: 1.86 (12/23 1605) Varicella:   GTT Third trimester:  RPR: Non Reactive (12/23 1605)  Rhogam O+ HBsAg: Negative (12/23 1605)  TDaP vaccine              Flu Shot: HIV: Non Reactive (12/23 1605)  Baby Food                                  Assessment: Patient Reported Symptoms:  Cognitive Cognitive Status: Normal speech and language skills, Alert and oriented to person, place, and time Cognitive/Intellectual Conditions Management [RPT]: None reported or documented in medical history or problem list      Neurological Neurological Review of Symptoms: Not assessed    HEENT HEENT Symptoms Reported: Not assessed      Cardiovascular Cardiovascular Symptoms Reported: Not assessed    Respiratory  Respiratory Symptoms Reported: Dry cough Other Respiratory Symptoms: Patient reports that dry cough is better because patient reports slowing down on smoking Respiratory Management Strategies: Adequate rest  Endocrine Endocrine Symptoms Reported: Not assessed    Gastrointestinal Gastrointestinal Symptoms Reported: Not assessed      Genitourinary Genitourinary Symptoms Reported: Frequency Additional Genitourinary Details: Patient reports frequency on and off at times Genitourinary Management Strategies: Adequate rest  Integumentary Integumentary Symptoms Reported: Sweating Additional Integumentary Details: Patient reports still having night sweats Skin Management Strategies: Adequate rest, Coping strategies  Musculoskeletal Musculoskelatal Symptoms Reviewed: Weakness Additional Musculoskeletal Details: Patient reports weakness in femur Musculoskeletal Management Strategies: Adequate rest      Psychosocial Psychosocial Symptoms Reported: Anxiety - if selected complete GAD, Depression - if selected complete PHQ 2-9 Additional Psychological Details: Patient reports that anxiety and depression is about the same, Patient excercising is helping Behavioral Management Strategies: Coping strategies, Exercise Behavioral Health Comment: Patient reports that she still wants to go to therapy Major Change/Loss/Stressor/Fears (CP): Relationship concerns, Resources Techniques to [place] with Loss/Stress/Change: Exercise Quality of Family Relationships: non-existent Do you feel physically threatened by others?: No      06/30/2024   10:31 AM 06/24/2024    2:26 PM 05/18/2024    1:26 PM 04/27/2024   11:53 AM 06/19/2023    2:11 PM  Depression screen PHQ 2/9  Decreased Interest 1 1 1  0   Down, Depressed, Hopeless 1 1  1 3   PHQ - 2 Score 2 2 2 3    Altered sleeping 1 1 0 0   Tired, decreased energy 3 3 3 3    Change in appetite 1 2 1 1    Feeling bad or failure about yourself  1 2 2 3    Trouble concentrating  1 1 1 1    Moving slowly or fidgety/restless 0 0 0 0   Suicidal thoughts 0 0 0 0   PHQ-9 Score 9 11 9 11    Difficult doing work/chores Very difficult Somewhat difficult Very difficult Very difficult      Information is confidential and restricted. Go to Review Flowsheets to unlock data.      There were no vitals filed for this visit. Pain Score: 0-No pain  Medications Reviewed Today     Reviewed by Sherren Olam BRAVO, LCSW (Social Worker) on 07/08/24 at 1416  Med List Status: <None>   Medication Order Taking? Sig Documenting Provider Last Dose Status Informant  cyclobenzaprine (FLEXERIL) 5 MG tablet [id number]  Take 5 mg by mouth as needed for muscle spasms. [provider]  Active   ibuprofen  (ADVIL ) 800 MG tablet [id number]  Take 800 mg by mouth every 8 (eight) hours as needed. [provider]  Active   norethindrone  (MICRONOR ) 0.35 MG tablet [id number]  Take 1 tablet (0.35 mg total) by mouth daily. Delinda Jinnie Jansky, CNM  Active   ondansetron  (ZOFRAN ) 4 MG tablet [id number]  Take 1 tablet (4 mg total) by mouth every 8 (eight) hours as needed for nausea or vomiting. Richad Jon HERO, NP  Active   rizatriptan  (MAXALT ) 5 MG tablet [id number]  Take 1 tablet (5 mg total) by mouth as needed for migraine. May repeat in 2 hours if needed Richad Jon HERO, NP  Active             Recommendation:   PCP Follow-up Continue Current Plan of Care Patient will schedule appointment with [facility] by next outreach.  Follow Up Plan:   Telephone follow-up 07/22/2024 at 2:00 PM   Olam Sherren, MSW, LCSW [place]  [facility], [facility] Licensed Clinical Social Worker Direct Dial: [phone]

## 2024-07-13 ENCOUNTER — Institutional Professional Consult (permissible substitution) (INDEPENDENT_AMBULATORY_CARE_PROVIDER_SITE_OTHER): Admitting: Family Medicine

## 2024-07-13 ENCOUNTER — Encounter (INDEPENDENT_AMBULATORY_CARE_PROVIDER_SITE_OTHER): Payer: Self-pay

## 2024-07-16 ENCOUNTER — Encounter (INDEPENDENT_AMBULATORY_CARE_PROVIDER_SITE_OTHER): Payer: Self-pay | Admitting: Family Medicine

## 2024-07-16 ENCOUNTER — Ambulatory Visit (INDEPENDENT_AMBULATORY_CARE_PROVIDER_SITE_OTHER): Admitting: Family Medicine

## 2024-07-16 VITALS — BP 130/85 | HR 88 | Temp 98.0°F | Ht 64.0 in | Wt 204.0 lb

## 2024-07-16 DIAGNOSIS — E7841 Elevated Lipoprotein(a): Secondary | ICD-10-CM

## 2024-07-16 DIAGNOSIS — Z6835 Body mass index (BMI) 35.0-35.9, adult: Secondary | ICD-10-CM

## 2024-07-16 DIAGNOSIS — R7303 Prediabetes: Secondary | ICD-10-CM

## 2024-07-16 DIAGNOSIS — F39 Unspecified mood [affective] disorder: Secondary | ICD-10-CM

## 2024-07-16 DIAGNOSIS — Z0289 Encounter for other administrative examinations: Secondary | ICD-10-CM

## 2024-07-16 NOTE — Progress Notes (Signed)
 " Molly DOROTHA Jenkins, DO, ABFM, ABOM Bariatric Physician [address] [place], [place], [place] [address] Office: [phone]  /  Fax: [phone]     Initial Visit  Molly Graves was seen in clinic today to evaluate for obesity. She is interested in losing weight to improve overall health and reduce the risk of weight related complications. She presents today to review program treatment options, initial physical assessment, and evaluation. She does express understanding that this appointment is a consultation and she does not expect to leave with a meal plan or medications prescribed.   She was referred by: Delinda Jinnie Jansky, CNM, OBGYN. She says that she previously have been seen for weight [facility] at the [facility] and has not experienced satisfactory results, so has been looking for alternatives.   When asked what else they would they like to accomplish? She states: Adopt a healthier eating pattern and lifestyle, Improve existing medical conditions, Improve quality of life, Improve appearance, and Improve self-confidence  When asked how has your weight affected you? She states: Has affected self-esteem, Contributed to medical problems, Having fatigue, Having poor endurance, Problems with eating patterns, and Has affected mood   Some associated conditions: Pre-HTN (<120/80 and >140/90), HLD, Pre-DM, hx of frequent migraines. Vitals:   07/16/24 1000 07/16/24 1023  BP: (!) 139/97 130/85   Contributing factors: family history of obesity (notes that her family has never been overly health conscious with food), consumption of processed foods, moderate to high levels of stress, chronic skipping of meals (difficulty meal prepping with 3 kids and online schooling), history of pregnancy (did lose weight after her pregnancies), and mental health problems. 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  Weight promoting medications identified: Contraceptives or hormonal therapy (Junel, as this was the OCP she was on the longest before switching to Micronor  as it was thought to be contributing to her migraines, and she has not noticed any weight gain with this - she does plan on undergoing a tubal ligation).  Current nutrition plan: None and Portion control / smart choices  Current level of physical activity: going to the gym 1x/ week doing strength & cardio x60 minutes.  Current or previous pharmacotherapy: Phentermine (no wt lost) and Other: B12 + BHCG injections.   Response to medication: Ineffective so it was discontinued  Social History She stopped working in 2024 to pursue her education, which she began at the end of 2025 - going to [employer]. Previously worked in teacher, music. She has 3 children - ages 77 y/o, 21 y/o going on 53, and a 34 y/o going on 5. She does have a partner who is extremely supportive.  Past medical history includes:   Past Medical History:  Diagnosis Date   Anemia    Back pain affecting pregnancy in third trimester 11/17/2018   Complication of intrauterine device (IUD) 04/19/2016   Depression    post partum depression   Dysuria 12/13/2018   Headache    MIGRAINES   Herpes genitalia    Normal vaginal delivery 12/25/2018   Postpartum care following vaginal delivery 12/25/2018   Pyelonephritis affecting pregnancy in second trimester 08/08/2018   Supervision of other normal pregnancy, antepartum 05/22/2018   Clinic [facility] Prenatal Labs Dating US  Blood type: O/Positive/-- (12/23 1605)  Genetic Screen declines Antibody:Negative (12/23 1605) Anatomic US   incomplete Rubella: 1.86 (12/23 1605)  Varicella:   GTT Third trimester:  RPR: Non Reactive (12/23 1605)  Rhogam O+ HBsAg: Negative (12/23 1605)  TDaP vaccine              Flu Shot: HIV: Non Reactive (12/23 1605)  Baby Food                                 Objective:   LMP 05/22/2024 (Exact Date)  She was weighed  on the bioimpedance scale: Weight 204 lb (92.5 kg)  BMI (Calculated) 35  Body Fat % 39.1 %  Muscle Mass (lbs) 118.2 lbs  Fat Mass (lbs) 80 lbs  Total Body Water (lbs) 76.2 lbs  Visceral Fat Rating  8  Peak Weight 209 lb   General: Well Developed, well nourished, and in no acute distress.  HEENT: Normocephalic, atraumatic Skin: Warm and dry, cap RF less 2 sec, good turgor Chest:  Normal excursion, shape, no gross abn Respiratory: speaking in full sentences, no conversational dyspnea NeuroM-Sk: Ambulates w/o assistance, moves * 4 Psych: A and O *3, insight good, mood-full   Assessment and Plan:   Assessment & Plan Prediabetes HPI She states that she's never been told that she's pre-diabetic, though at her OBGYN, she was told her numbers are borderline prediabetic.   Assessment & Plan: - At her most recent OBGYN appointment, her hgbA1c was 5.8%, which I explained to her is within the prediabetic range. - She does not currently take medications to manage this, has been working on lifestyle changes to improve these numbers.   - I explained to her how, if she joins this program, we will work on blood sugar control to aid with weight loss to prevent the progression to diabetes. Lab Results  Component Value Date   HGBA1C 5.8 (H) 06/23/2024    Mood disorder HPI She reports having a history of depression, which has contributed to her weight gain at some points. She also notes that her oldest child has additionally been experiencing mood issues, which have been causing Molly Graves significant stress.   Assessment & Plan: - We reviewed how weight loss can contribute to improvement in symptoms of mood disorders and how obesity can contribute to worsening symptoms of mood disorders. - There may be an element of genetics seeing as her child has been experiencing mood problems as well. To note however said child is also a teenager Flowsheet Row Patient Outreach Telephone from 06/30/2024 in [facility] DEPARTMENT  PHQ-9 Total Score 9      06/24/2024    2:30 PM  GAD 7 : Generalized Anxiety Score  Nervous, Anxious, on Edge 2   Control/stop worrying 1   Worry too much - different things 1   Trouble relaxing 0   Restless 0   Easily annoyed or irritable 2   Afraid - awful might happen 0   Total GAD 7 Score 6     Data saved with a previous flowsheet row definition    Elevated lipoprotein(a) HPI She reports that she's never been told she has hyperlipidemia, however her LDL was elevated at her most recent OBGYN appointment.   Assessment & Plan: - HLD is a new diagnosis based off her most recent LDL elevated at 125 mg/dL.  - She does not currently take medications to manage this, has been working on lifestyle changes to improve these numbers.   - I explained to her how if she  joins this program, we would work on cholesterol control through meal planning and exercise with the goal for her LDL to be <=100.  Lipid Panel:  Lab Results  Component Value Date   CHOL 195 06/23/2024   HDL 55 06/23/2024   LDLCALC 125 (H) 06/23/2024   TRIG 82 06/23/2024  The ASCVD Risk score (Arnett DK, et al., 2019) failed to calculate for the following reasons:   The 2019 ASCVD risk score is only valid for ages 52 to 49    Obesity Treatment Plan: She will work on gathering support from family and friends to begin weight loss journey. Work on eliminating or reducing the presence of highly processed, calorie dense foods in the home. Complete provided nutritional and psychosocial assessment questionnaire prior to her next visit.  Assess for cardiometabolic complications and nutritional deficiencies via fasting serologies.  Assess REE via indirect calorimetry to guide the creation of a reduced calorie, high protein meal plan to promote loss of fat mass.   Molly Graves will follow up in the next 1-2 weeks to review the above steps and continue evaluation and treatment of their disease  of obesity  Obesity Education Performed Today: She was weighed on the bioimpedance scale and results were discussed and documented above  We discussed obesity as a disease and the importance of a more detailed evaluation of all the factors contributing to the disease. I informed her of how obesity contributes to other health conditions, such as migraines, mood disorders, insulin resistance/prediabetes/diabetes, cardiac conditions such as HTN or HLD/CAD, and more.   We discussed the importance of long term lifestyle changes which include nutrition, exercise (at least 150 minutes of moderate intensity exercise to prevent obesity or maintain weight) and behavioral modifications as well as the importance of customizing this to her specific health and social needs. Our goal would be for her to lose 0.5 - 1 LBS of FAT per week when first initiating meal/exercise plan.  We discussed the benefits of reaching a healthier weight to alleviate the symptoms of existing conditions and reduce the risks of the biomechanical, metabolic and psychological effects of obesity.  Molly Graves appears to be in the action stage of change and states they are ready to start intensive lifestyle modifications and behavioral modifications.  45 minutes was spent today on this visit including the above counseling, pre-visit chart review, and post-visit documentation.  Attestations:  Reviewed by clinician on day of visit: allergies, medications, problem list, medical history, surgical history, family history, social history, and previous encounter notes applicable to the diagnosis of obesity.  I, Emily Lagle, acting as a neurosurgeon for [employer], DO., have documented all relevant documentation on the behalf of Molly Jenkins, DO, while in the presence of Molly Jenkins, DO.  I, Molly Jenkins, DO, have reviewed all documentation for this visit. The documentation on 11/13/22 for the exam, diagnosis, procedures, and orders  are all accurate and complete.  "

## 2024-07-21 ENCOUNTER — Other Ambulatory Visit: Payer: Self-pay | Admitting: Licensed Practical Nurse

## 2024-07-21 DIAGNOSIS — Z30011 Encounter for initial prescription of contraceptive pills: Secondary | ICD-10-CM

## 2024-07-22 ENCOUNTER — Ambulatory Visit: Admitting: Licensed Practical Nurse

## 2024-07-22 ENCOUNTER — Other Ambulatory Visit: Payer: Self-pay

## 2024-07-22 ENCOUNTER — Encounter: Payer: Self-pay | Admitting: Licensed Practical Nurse

## 2024-07-22 VITALS — BP 123/81 | HR 90 | Ht 64.0 in | Wt 206.1 lb

## 2024-07-22 DIAGNOSIS — Z09 Encounter for follow-up examination after completed treatment for conditions other than malignant neoplasm: Secondary | ICD-10-CM

## 2024-07-22 NOTE — Patient Outreach (Signed)
 Complex Care Management   Visit Note  07/22/2024  Name:  VAUGHN FRIEZE MRN: [id number] DOB: 08-18-1990  Situation: Background:   Referral received for Complex Care Management related to Mental/Behavioral Health diagnosis anxiety and depression I obtained verbal consent from Patient.  Visit completed with Patient  on the phone. Patient is amenable to working with LCSW to address mental health concerns. Patient informed LCSW the plan is for patient to utilize [facility] for mental health treatment and the plan is to have appointment by next outreach.   Past Medical History:  Diagnosis Date   Anemia    Back pain affecting pregnancy in third trimester 11/17/2018   Complication of intrauterine device (IUD) 04/19/2016   Depression    post partum depression   Dysuria 12/13/2018   Headache    MIGRAINES   Herpes genitalia    Normal vaginal delivery 12/25/2018   Postpartum care following vaginal delivery 12/25/2018   Pyelonephritis affecting pregnancy in second trimester 08/08/2018   Supervision of other normal pregnancy, antepartum 05/22/2018   Clinic [facility] Prenatal Labs Dating US  Blood type: O/Positive/-- (12/23 1605)  Genetic Screen declines Antibody:Negative (12/23 1605) Anatomic US   incomplete Rubella: 1.86 (12/23 1605) Varicella:   GTT Third trimester:  RPR: Non Reactive (12/23 1605)  Rhogam O+ HBsAg: Negative (12/23 1605)  TDaP vaccine              Flu Shot: HIV: Non Reactive (12/23 1605)  Baby Food                                  Assessment: Patient Reported Symptoms:  Cognitive Cognitive Status: Normal speech and language skills, Alert and oriented to person, place, and time Cognitive/Intellectual Conditions Management [RPT]: None reported or documented in medical history or problem list      Neurological Neurological Review of Symptoms: Headaches (Patient reports crying alot and had a headache) Neurological Management Strategies: Adequate rest  HEENT HEENT Symptoms Reported: No symptoms  reported HEENT Management Strategies: Adequate rest    Cardiovascular Cardiovascular Symptoms Reported: No symptoms reported Does patient have uncontrolled Hypertension?: No Cardiovascular Management Strategies: Adequate rest  Respiratory Respiratory Symptoms Reported: Dry cough Other Respiratory Symptoms: Patient reports some dry cough due to smoking Respiratory Management Strategies: Adequate rest  Endocrine Endocrine Symptoms Reported: No symptoms reported Is patient diabetic?: No    Gastrointestinal Gastrointestinal Symptoms Reported: Not assessed      Genitourinary Genitourinary Symptoms Reported: Frequency Additional Genitourinary Details: Patient reports urination frequency sometimes    Integumentary Integumentary Symptoms Reported: Sweating, Rash (Patient reports dry itching skin and legs) Additional Integumentary Details: Patient still reports night sweat Skin Management Strategies: Adequate rest  Musculoskeletal Musculoskelatal Symptoms Reviewed: Weakness Additional Musculoskeletal Details: Patient reports weakness in femur        Psychosocial Psychosocial Symptoms Reported: Anxiety - if selected complete GAD, Depression - if selected complete PHQ 2-9 Additional Psychological Details: Patient reports that anxiety and depression symptoms have heighten due to a build up of events Behavioral Management Strategies: Coping strategies, Activity Behavioral Health Comment: Patient reports that she was feeling very overwhelmed Major Change/Loss/Stressor/Fears (CP): Relationship concerns, Resources Behaviors When Feeling Stressed/Fearful: Patient repors that smoking assists when she is stressed Techniques to Cope with Loss/Stress/Change: Exercise Quality of Family Relationships: non-existent Do you feel physically threatened by others?: No    07/22/2024    PHQ2-9 Depression Screening   Little interest or pleasure in doing things More  than half the days  Feeling down, depressed,  or hopeless Nearly every day  PHQ-2 - Total Score 5  Trouble falling or staying asleep, or sleeping too much Not at all  Feeling tired or having little energy More than half the days  Poor appetite or overeating  Nearly every day  Feeling bad about yourself - or that you are a failure or have let yourself or your family down Nearly every day  Trouble concentrating on things, such as reading the newspaper or watching television Nearly every day  Moving or speaking so slowly that other people could have noticed.  Or the opposite - being so fidgety or restless that you have been moving around a lot more than usual Not at all  Thoughts that you would be better off dead, or hurting yourself in some way Not at all  PHQ2-9 Total Score 16  If you checked off any problems, how difficult have these problems made it for you to do your work, take care of things at home, or get along with other people Somewhat difficult  Depression Interventions/Treatment  (Patient is seeking counseling)    There were no vitals filed for this visit. Pain Scale: 0-10 Pain Score: 0-No pain  Medications Reviewed Today     Reviewed by Sherren Olam BRAVO, LCSW (Social Worker) on 07/22/24 at 1409  Med List Status: <None>   Medication Order Taking? Sig Documenting Provider Last Dose Status Informant  cyclobenzaprine (FLEXERIL) 5 MG tablet [id number] Yes Take 5 mg by mouth as needed for muscle spasms. [provider]  Active   ibuprofen  (ADVIL ) 800 MG tablet [id number] Yes Take 800 mg by mouth every 8 (eight) hours as needed. [provider]  Active   norethindrone  (MICRONOR ) 0.35 MG tablet [id number] Yes Take 1 tablet (0.35 mg total) by mouth daily. Delinda Jinnie Jansky, CNM  Active   ondansetron  (ZOFRAN ) 4 MG tablet [id number] Yes Take 1 tablet (4 mg total) by mouth every 8 (eight) hours as needed for nausea or vomiting. Richad Jon HERO, NP  Active             Recommendation:   Continue Current Plan of  Care Patient will schedule appointment with [facility] by next outreach  Follow Up Plan:   Telephone follow-up 08/05/2024 2:00 PM  Olam Sherren, MSW, LCSW [place]  [facility], [facility] Licensed Clinical Social Worker Direct Dial: [phone]

## 2024-07-22 NOTE — Patient Instructions (Signed)
 Visit Information  Thank you for taking time to visit with me today. Please don't hesitate to contact me if I can be of assistance to you before our next scheduled appointment.  Your next care management appointment is by telephone on 08/05/2024 at 2:00 pm   Please call the care guide team at [phone] if you need to cancel, schedule, or reschedule an appointment.   Please call the Suicide and Crisis Lifeline: 988 if you are experiencing a Mental Health or Behavioral Health Crisis or need someone to talk to.  Olam Ally, MSW, LCSW [place]  [facility], [facility] Licensed Clinical Social Worker Direct Dial: [phone]

## 2024-07-22 NOTE — Progress Notes (Signed)
 "   [facility]   No chief complaint on file.   HPI:      Molly Graves is a 34 y.o. [id number] whose LMP was Patient's last menstrual period was 05/22/2024 (exact date)., presents today for a follow up but she is not sure why.  She was last seen in January for an annual exam, she was switched to Micronor  and referred for weight loss treatment and a BTL.   -Has started Micronor , has not had cycle yet -Was seen at the weight loss clinic has return apt, feels happy to be in a program for weight loss, was pleased with the provider she saw      Going to the gym more     Eating habits are messed up, appetite fluctuates depends on her stress, too stressed less hungry then she over eats when she is hungry -Has not had migraines   -Has apt 2/11 to discuss BTL  -Mood has been down some, states she does not have any friends and her family is not in the area     Going to [facility] tomorrow       Patient Active Problem List   Diagnosis Date Noted   MDD (major depressive disorder), recurrent episode, moderate (HCC) 06/19/2023   GAD (generalized anxiety disorder) 06/19/2023   Long term current use of cannabis 06/19/2023   Bereavement 06/19/2023   History of ADHD 06/19/2023   Retained products of conception following abortion    Incomplete spontaneous abortion    S/P ORIF (open reduction internal fixation) fracture IM nail left femur 02/18/2019 03/04/2019   Femur fracture (HCC) 02/17/2019   Closed fracture of femur, shaft (HCC) 02/17/2019   MDD (major depressive disorder) 10/10/2018   Severe recurrent major depression without psychotic features (HCC) 10/10/2018   Herpes genitalis 10/02/2018   Iron  deficiency anemia 09/03/2018    Past Surgical History:  Procedure Laterality Date   BREAST SURGERY     lumpectomy left breast   DIAGNOSTIC LAPAROSCOPY  2015   to rule out uterine injury after D&E   DILATION AND EVACUATION N/A 02/10/2020   Procedure: DILATATION AND EVACUATION;  Surgeon:  Victor Claudell SAUNDERS, MD;  Location: [facility] ORS;  Service: Gynecology;  Laterality: N/A;   FEMUR IM NAIL Left 02/18/2019   Procedure: INTRAMEDULLARY (IM) NAIL FEMORAL;  Surgeon: Margrette Taft BRAVO, MD;  Location: [facility];  Service: Orthopedics;  Laterality: Left;   HYSTEROSCOPY WITH D & C N/A 04/19/2016   Procedure: DILATATION AND CURETTAGE /HYSTEROSCOPY;  Surgeon: Lamar SHAUNNA Lesches, MD;  Location: [facility] OR[facility];  Service: Gynecology;  Laterality: N/A;   INDUCED ABORTION  2015   IUD REMOVAL N/A 04/19/2016   Procedure: INTRAUTERINE DEVICE (IUD) REMOVAL;  Surgeon: Lamar SHAUNNA Lesches, MD;  Location: [facility] ORS;  Service: Gynecology;  Laterality: N/A;   LAPAROSCOPY N/A 04/19/2016   Procedure: LAPAROSCOPY DIAGNOSTIC;  Surgeon: Lamar SHAUNNA Lesches, MD;  Location: [facility] OR[facility];  Service: Gynecology;  Laterality: N/A;    Family History  Problem Relation Age of Onset   Bipolar disorder Mother    Hypertension Mother    Hypertension Maternal Aunt    Cancer Paternal Aunt    Cancer Maternal Uncle    Schizophrenia Maternal Grandfather    Hypertension Maternal Grandmother    ODD Daughter    Autism Son    Cancer Other 29       Cervical, Malignant    Social History   Socioeconomic History   Marital status: Single    Spouse name:  Not on file   Number of children: 3   Years of education: Not on file   Highest education level: Some college, no degree  Occupational History   Not on file  Tobacco Use   Smoking status: Every Day    Types: Cigars    Last attempt to quit: 04/17/2018    Years since quitting: 6.2   Smokeless tobacco: Never  Vaping Use   Vaping status: Former   Quit date: 06/17/2016   Substances: CBD  Substance and Sexual Activity   Alcohol use: Not Currently    Alcohol/week: 1.0 standard drink of alcohol    Types: 1 Glasses of wine per week    Comment: occ   Drug use: No   Sexual activity: Not Currently    Partners: Male  Other Topics Concern   Not on file  Social History Narrative   Not on file    Social Drivers of Health   Tobacco Use: High Risk (07/22/2024)   Patient History    Smoking Tobacco Use: Every Day    Smokeless Tobacco Use: Never    Passive Exposure: Not on file  Financial Resource Strain: High Risk (06/02/2023)   Overall Financial Resource Strain (CARDIA)    Difficulty of Paying Living Expenses: Very hard  Food Insecurity: Food Insecurity Present (06/30/2024)   Epic    Worried About Programme Researcher, Broadcasting/film/video in the Last Year: Never true    Ran Out of Food in the Last Year: Sometimes true  Transportation Needs: Unmet Transportation Needs (06/30/2024)   Epic    Lack of Transportation (Medical): No    Lack of Transportation (Non-Medical): Yes  Physical Activity: Inactive (06/02/2023)   Exercise Vital Sign    Days of Exercise per Week: 0 days    Minutes of Exercise per Session: 0 min  Stress: Stress Concern Present (06/02/2023)   [employer] of Occupational Health - Occupational Stress Questionnaire    Feeling of Stress : Rather much  Social Connections: Moderately Integrated (06/02/2023)   Social Connection and Isolation Panel    Frequency of Communication with Friends and Family: More than three times a week    Frequency of Social Gatherings with Friends and Family: More than three times a week    Attends Religious Services: 1 to 4 times per year    Active Member of [employer] or Organizations: Yes    Attends Banker Meetings: Never    Marital Status: Never married  Intimate Partner Violence: Not At Risk (06/30/2024)   Epic    Fear of Current or Ex-Partner: No    Emotionally Abused: No    Physically Abused: No    Sexually Abused: No  Depression (PHQ2-9): High Risk (07/22/2024)   Depression (PHQ2-9)    PHQ-2 Score: 16  Alcohol Screen: Low Risk (06/02/2023)   Alcohol Screen    Last Alcohol Screening Score (AUDIT): 2  Housing: Low Risk (06/30/2024)   Epic    Unable to Pay for Housing in the Last Year: No    Number of Times Moved in the Last Year: 1     Homeless in the Last Year: No  Utilities: Not At Risk (06/30/2024)   Epic    Threatened with loss of utilities: No  Recent Concern: Utilities - At Risk (04/27/2024)   Epic    Threatened with loss of utilities: Yes  Health Literacy: Adequate Health Literacy (06/02/2023)   B1300 Health Literacy    Frequency of need for help with medical instructions:  Never    Outpatient Medications Prior to Visit  Medication Sig Dispense Refill   cyclobenzaprine (FLEXERIL) 5 MG tablet Take 5 mg by mouth as needed for muscle spasms.     ibuprofen  (ADVIL ) 800 MG tablet Take 800 mg by mouth every 8 (eight) hours as needed.     norethindrone  (MICRONOR ) 0.35 MG tablet Take 1 tablet (0.35 mg total) by mouth daily. 28 tablet 11   ondansetron  (ZOFRAN ) 4 MG tablet Take 1 tablet (4 mg total) by mouth every 8 (eight) hours as needed for nausea or vomiting. 20 tablet 0   No facility-administered medications prior to visit.      ROS:  Review of Systems see HPI    OBJECTIVE:   Vitals:  BP 123/81   Pulse 90   Ht 5' 4 (1.626 m)   Wt 206 lb 1.6 oz (93.5 kg)   LMP 05/22/2024 (Exact Date)   BMI 35.38 kg/m   Physical Exam Constitutional:      Appearance: Normal appearance.  Pulmonary:     Effort: Pulmonary effort is normal.  Neurological:     Mental Status: She is alert.  Psychiatric:        Mood and Affect: Mood normal.        Thought Content: Thought content normal.        Judgment: Judgment normal.     Results: No results found for this or any previous visit (from the past 24 hours).   Assessment/Plan: 1. Follow-up exam (Primary)   -Reviewed lab results (pt previously saw mychart message), aware of HA1C and Lipids, discussed pap-will need repeat in 1 year  -continue Micronor  until the BTL is done -Praised pt for starting her journey and self improvement with her physical and mental health, rec looking for community events or meetups to meet new people.  -Return in 1 year for annual  exam   No orders of the defined types were placed in this encounter.    JINNIE HERO [facility], CNM 07/22/2024 4:29 PM      "

## 2024-07-27 ENCOUNTER — Ambulatory Visit (INDEPENDENT_AMBULATORY_CARE_PROVIDER_SITE_OTHER): Admitting: Family Medicine

## 2024-07-28 ENCOUNTER — Encounter: Payer: Self-pay | Admitting: Obstetrics and Gynecology

## 2024-08-02 ENCOUNTER — Telehealth

## 2024-08-05 ENCOUNTER — Telehealth

## 2024-08-11 ENCOUNTER — Ambulatory Visit (INDEPENDENT_AMBULATORY_CARE_PROVIDER_SITE_OTHER): Admitting: Family Medicine
